# Patient Record
Sex: Female | Born: 1960 | Race: White | Hispanic: No | Smoking: Never smoker
Health system: Southern US, Community
[De-identification: ages and names within clinical notes are randomized; demographics above are authoritative.]

## PROBLEM LIST (undated history)

## (undated) DIAGNOSIS — M3130 Wegener's granulomatosis without renal involvement: Secondary | ICD-10-CM

## (undated) DIAGNOSIS — Z8614 Personal history of Methicillin resistant Staphylococcus aureus infection: Secondary | ICD-10-CM

## (undated) DIAGNOSIS — M1990 Unspecified osteoarthritis, unspecified site: Secondary | ICD-10-CM

## (undated) DIAGNOSIS — K219 Gastro-esophageal reflux disease without esophagitis: Secondary | ICD-10-CM

## (undated) DIAGNOSIS — K8020 Calculus of gallbladder without cholecystitis without obstruction: Secondary | ICD-10-CM

## (undated) DIAGNOSIS — T7840XA Allergy, unspecified, initial encounter: Secondary | ICD-10-CM

## (undated) DIAGNOSIS — F419 Anxiety disorder, unspecified: Secondary | ICD-10-CM

## (undated) DIAGNOSIS — H9319 Tinnitus, unspecified ear: Secondary | ICD-10-CM

## (undated) DIAGNOSIS — I341 Nonrheumatic mitral (valve) prolapse: Secondary | ICD-10-CM

## (undated) DIAGNOSIS — K2970 Gastritis, unspecified, without bleeding: Secondary | ICD-10-CM

## (undated) DIAGNOSIS — E079 Disorder of thyroid, unspecified: Secondary | ICD-10-CM

## (undated) DIAGNOSIS — R011 Cardiac murmur, unspecified: Secondary | ICD-10-CM

## (undated) DIAGNOSIS — G44009 Cluster headache syndrome, unspecified, not intractable: Secondary | ICD-10-CM

## (undated) DIAGNOSIS — M810 Age-related osteoporosis without current pathological fracture: Secondary | ICD-10-CM

## (undated) DIAGNOSIS — I8390 Asymptomatic varicose veins of unspecified lower extremity: Secondary | ICD-10-CM

## (undated) DIAGNOSIS — J3089 Other allergic rhinitis: Secondary | ICD-10-CM

## (undated) DIAGNOSIS — E785 Hyperlipidemia, unspecified: Secondary | ICD-10-CM

## (undated) DIAGNOSIS — K5790 Diverticulosis of intestine, part unspecified, without perforation or abscess without bleeding: Secondary | ICD-10-CM

## (undated) DIAGNOSIS — N2889 Other specified disorders of kidney and ureter: Secondary | ICD-10-CM

## (undated) HISTORY — DX: Unspecified osteoarthritis, unspecified site: M19.90

## (undated) HISTORY — DX: Asymptomatic varicose veins of unspecified lower extremity: I83.90

## (undated) HISTORY — DX: Age-related osteoporosis without current pathological fracture: M81.0

## (undated) HISTORY — PX: OTHER SURGICAL HISTORY: SHX169

## (undated) HISTORY — PX: COLONOSCOPY: SHX174

## (undated) HISTORY — PX: AIRWAY FOREIGN BODY REMOVAL: SHX1129

## (undated) HISTORY — DX: Personal history of Methicillin resistant Staphylococcus aureus infection: Z86.14

## (undated) HISTORY — DX: Cardiac murmur, unspecified: R01.1

## (undated) HISTORY — PX: TEAR DUCT PROBING: SHX793

## (undated) HISTORY — DX: Disorder of thyroid, unspecified: E07.9

## (undated) HISTORY — PX: UPPER GASTROINTESTINAL ENDOSCOPY: SHX188

## (undated) HISTORY — DX: Other allergic rhinitis: J30.89

## (undated) HISTORY — DX: Nonrheumatic mitral (valve) prolapse: I34.1

## (undated) HISTORY — DX: Anxiety disorder, unspecified: F41.9

## (undated) HISTORY — DX: Gastritis, unspecified, without bleeding: K29.70

## (undated) HISTORY — DX: Allergy, unspecified, initial encounter: T78.40XA

## (undated) HISTORY — PX: NASAL SINUS SURGERY: SHX719

## (undated) HISTORY — PX: TRACHEOSTOMY CLOSURE: SHX458

## (undated) HISTORY — DX: Gastro-esophageal reflux disease without esophagitis: K21.9

## (undated) HISTORY — DX: Other specified disorders of kidney and ureter: N28.89

## (undated) HISTORY — DX: Cluster headache syndrome, unspecified, not intractable: G44.009

## (undated) HISTORY — DX: Tinnitus, unspecified ear: H93.19

## (undated) HISTORY — DX: Calculus of gallbladder without cholecystitis without obstruction: K80.20

## (undated) HISTORY — DX: Hyperlipidemia, unspecified: E78.5

## (undated) HISTORY — DX: Diverticulosis of intestine, part unspecified, without perforation or abscess without bleeding: K57.90

---

## 1996-09-04 DIAGNOSIS — M3130 Wegener's granulomatosis without renal involvement: Secondary | ICD-10-CM

## 1996-09-04 HISTORY — DX: Wegener's granulomatosis without renal involvement: M31.30

## 2009-03-02 ENCOUNTER — Ambulatory Visit: Payer: Self-pay | Admitting: Oncology

## 2009-03-04 ENCOUNTER — Encounter: Payer: Self-pay | Admitting: Pulmonary Disease

## 2009-03-05 LAB — CBC WITH DIFFERENTIAL/PLATELET
BASO%: 0.4 % (ref 0.0–2.0)
EOS%: 0.4 % (ref 0.0–7.0)
HCT: 40 % (ref 34.8–46.6)
LYMPH%: 32.2 % (ref 14.0–49.7)
MCH: 31.2 pg (ref 25.1–34.0)
MCHC: 33.8 g/dL (ref 31.5–36.0)
NEUT%: 56.3 % (ref 38.4–76.8)
Platelets: 273 10*3/uL (ref 145–400)

## 2009-03-12 LAB — CBC WITH DIFFERENTIAL/PLATELET
Basophils Absolute: 0 10*3/uL (ref 0.0–0.1)
Eosinophils Absolute: 0 10*3/uL (ref 0.0–0.5)
LYMPH%: 28.5 % (ref 14.0–49.7)
MCH: 30.9 pg (ref 25.1–34.0)
MCHC: 33.3 g/dL (ref 31.5–36.0)
MONO#: 1.1 10*3/uL — ABNORMAL HIGH (ref 0.1–0.9)
MONO%: 10.3 % (ref 0.0–14.0)
NEUT%: 60.5 % (ref 38.4–76.8)
RBC: 4.21 10*6/uL (ref 3.70–5.45)
RDW: 13.8 % (ref 11.2–14.5)

## 2009-03-19 LAB — CBC WITH DIFFERENTIAL/PLATELET
BASO%: 0.3 % (ref 0.0–2.0)
HCT: 39.7 % (ref 34.8–46.6)
LYMPH%: 28.4 % (ref 14.0–49.7)
MCHC: 33.8 g/dL (ref 31.5–36.0)
MCV: 91.7 fL (ref 79.5–101.0)
MONO#: 1.2 10*3/uL — ABNORMAL HIGH (ref 0.1–0.9)
MONO%: 10.3 % (ref 0.0–14.0)
NEUT%: 60.6 % (ref 38.4–76.8)
Platelets: 244 10*3/uL (ref 145–400)
RBC: 4.33 10*6/uL (ref 3.70–5.45)
nRBC: 0 % (ref 0–0)

## 2009-03-24 DIAGNOSIS — M3130 Wegener's granulomatosis without renal involvement: Secondary | ICD-10-CM

## 2009-03-24 HISTORY — DX: Wegener's granulomatosis without renal involvement: M31.30

## 2009-04-01 ENCOUNTER — Ambulatory Visit: Payer: Self-pay | Admitting: Oncology

## 2009-04-02 ENCOUNTER — Encounter: Payer: Self-pay | Admitting: Oncology

## 2009-04-02 ENCOUNTER — Other Ambulatory Visit: Admission: RE | Admit: 2009-04-02 | Discharge: 2009-04-02 | Payer: Self-pay | Admitting: Oncology

## 2009-04-27 ENCOUNTER — Ambulatory Visit: Payer: Self-pay | Admitting: Oncology

## 2009-04-27 LAB — COMPREHENSIVE METABOLIC PANEL
Alkaline Phosphatase: 52 U/L (ref 39–117)
BUN: 12 mg/dL (ref 6–23)
Creatinine, Ser: 0.87 mg/dL (ref 0.40–1.20)
Glucose, Bld: 82 mg/dL (ref 70–99)
Total Bilirubin: 0.6 mg/dL (ref 0.3–1.2)

## 2009-04-27 LAB — CBC WITH DIFFERENTIAL/PLATELET
Basophils Absolute: 0 10*3/uL (ref 0.0–0.1)
Eosinophils Absolute: 0 10*3/uL (ref 0.0–0.5)
HCT: 38.8 % (ref 34.8–46.6)
LYMPH%: 17.3 % (ref 14.0–49.7)
MCV: 91.6 fL (ref 79.5–101.0)
MONO%: 10.1 % (ref 0.0–14.0)
NEUT#: 7.5 10*3/uL — ABNORMAL HIGH (ref 1.5–6.5)
NEUT%: 72.1 % (ref 38.4–76.8)
Platelets: 277 10*3/uL (ref 145–400)
RBC: 4.24 10*6/uL (ref 3.70–5.45)

## 2009-04-27 LAB — IGG, IGA, IGM
IgA: 168 mg/dL (ref 68–378)
IgG (Immunoglobin G), Serum: 726 mg/dL (ref 694–1618)
IgM, Serum: 66 mg/dL (ref 60–263)

## 2009-06-03 ENCOUNTER — Ambulatory Visit: Payer: Self-pay | Admitting: Oncology

## 2009-12-06 ENCOUNTER — Ambulatory Visit: Payer: Self-pay | Admitting: Oncology

## 2009-12-06 ENCOUNTER — Other Ambulatory Visit: Admission: RE | Admit: 2009-12-06 | Discharge: 2009-12-06 | Payer: Self-pay | Admitting: Oncology

## 2009-12-06 LAB — CBC WITH DIFFERENTIAL/PLATELET
Eosinophils Absolute: 0 10*3/uL (ref 0.0–0.5)
HCT: 40.3 % (ref 34.8–46.6)
HGB: 13.4 g/dL (ref 11.6–15.9)
LYMPH%: 13.8 % — ABNORMAL LOW (ref 14.0–49.7)
MONO#: 0.8 10*3/uL (ref 0.1–0.9)
NEUT#: 7 10*3/uL — ABNORMAL HIGH (ref 1.5–6.5)
NEUT%: 76.3 % (ref 38.4–76.8)
Platelets: 282 10*3/uL (ref 145–400)
WBC: 9.2 10*3/uL (ref 3.9–10.3)

## 2009-12-09 LAB — COMPREHENSIVE METABOLIC PANEL
ALT: 15 U/L (ref 0–35)
Albumin: 4.2 g/dL (ref 3.5–5.2)
BUN: 13 mg/dL (ref 6–23)
CO2: 20 mEq/L (ref 19–32)
Calcium: 9.3 mg/dL (ref 8.4–10.5)
Chloride: 106 mEq/L (ref 96–112)
Creatinine, Ser: 0.95 mg/dL (ref 0.40–1.20)
Potassium: 4.2 mEq/L (ref 3.5–5.3)

## 2009-12-09 LAB — C-REACTIVE PROTEIN: CRP: 0.3 mg/dL (ref <0.6)

## 2010-01-26 ENCOUNTER — Ambulatory Visit: Payer: Self-pay | Admitting: Oncology

## 2010-02-10 LAB — CBC WITH DIFFERENTIAL/PLATELET
BASO%: 0 % (ref 0.0–2.0)
Basophils Absolute: 0 10*3/uL (ref 0.0–0.1)
EOS%: 0.1 % (ref 0.0–7.0)
Eosinophils Absolute: 0 10*3/uL (ref 0.0–0.5)
HCT: 39.4 % (ref 34.8–46.6)
HGB: 13.1 g/dL (ref 11.6–15.9)
LYMPH%: 8.9 % — ABNORMAL LOW (ref 14.0–49.7)
MCH: 29.8 pg (ref 25.1–34.0)
MCHC: 33.4 g/dL (ref 31.5–36.0)
MCV: 89.4 fL (ref 79.5–101.0)
MONO#: 0.5 10*3/uL (ref 0.1–0.9)
MONO%: 3.9 % (ref 0.0–14.0)
NEUT#: 11.3 10*3/uL — ABNORMAL HIGH (ref 1.5–6.5)
NEUT%: 87.1 % — ABNORMAL HIGH (ref 38.4–76.8)
Platelets: 320 10*3/uL (ref 145–400)
RBC: 4.41 10*6/uL (ref 3.70–5.45)
RDW: 14 % (ref 11.2–14.5)
WBC: 13 10*3/uL — ABNORMAL HIGH (ref 3.9–10.3)
lymph#: 1.2 10*3/uL (ref 0.9–3.3)

## 2010-02-14 LAB — COMPREHENSIVE METABOLIC PANEL
ALT: 17 U/L (ref 0–35)
AST: 17 U/L (ref 0–37)
Albumin: 4 g/dL (ref 3.5–5.2)
Alkaline Phosphatase: 57 U/L (ref 39–117)
BUN: 15 mg/dL (ref 6–23)
CO2: 20 mEq/L (ref 19–32)
Calcium: 9.3 mg/dL (ref 8.4–10.5)
Chloride: 105 mEq/L (ref 96–112)
Creatinine, Ser: 0.87 mg/dL (ref 0.40–1.20)
Glucose, Bld: 83 mg/dL (ref 70–99)
Potassium: 4.1 mEq/L (ref 3.5–5.3)
Sodium: 140 mEq/L (ref 135–145)
Total Bilirubin: 0.3 mg/dL (ref 0.3–1.2)
Total Protein: 6.4 g/dL (ref 6.0–8.3)

## 2010-05-03 ENCOUNTER — Ambulatory Visit: Payer: Self-pay | Admitting: Oncology

## 2010-06-06 ENCOUNTER — Other Ambulatory Visit: Admission: RE | Admit: 2010-06-06 | Discharge: 2010-06-06 | Payer: Self-pay | Admitting: Oncology

## 2010-06-06 ENCOUNTER — Ambulatory Visit: Payer: Self-pay | Admitting: Oncology

## 2010-06-08 LAB — ANCA SCREEN W REFLEX TITER: ANCA Screen: NEGATIVE

## 2010-06-10 LAB — FLOW CYTOMETRY

## 2010-07-07 ENCOUNTER — Ambulatory Visit: Payer: Self-pay | Admitting: Oncology

## 2010-07-11 ENCOUNTER — Other Ambulatory Visit: Admission: RE | Admit: 2010-07-11 | Discharge: 2010-07-11 | Payer: Self-pay | Admitting: Oncology

## 2010-07-11 LAB — COMPREHENSIVE METABOLIC PANEL
Albumin: 4.3 g/dL (ref 3.5–5.2)
BUN: 12 mg/dL (ref 6–23)
CO2: 27 mEq/L (ref 19–32)
Calcium: 9.4 mg/dL (ref 8.4–10.5)
Chloride: 105 mEq/L (ref 96–112)
Glucose, Bld: 87 mg/dL (ref 70–99)
Potassium: 3.7 mEq/L (ref 3.5–5.3)

## 2010-07-11 LAB — CBC WITH DIFFERENTIAL/PLATELET
Basophils Absolute: 0 10*3/uL (ref 0.0–0.1)
HCT: 40.4 % (ref 34.8–46.6)
HGB: 13.2 g/dL (ref 11.6–15.9)
MCH: 29.5 pg (ref 25.1–34.0)
MONO#: 0.8 10*3/uL (ref 0.1–0.9)
NEUT%: 62.7 % (ref 38.4–76.8)
WBC: 10.4 10*3/uL — ABNORMAL HIGH (ref 3.9–10.3)
lymph#: 2.9 10*3/uL (ref 0.9–3.3)

## 2010-08-08 ENCOUNTER — Other Ambulatory Visit
Admission: RE | Admit: 2010-08-08 | Discharge: 2010-08-08 | Payer: Self-pay | Source: Home / Self Care | Admitting: Oncology

## 2010-08-08 ENCOUNTER — Ambulatory Visit: Payer: Self-pay | Admitting: Oncology

## 2010-08-08 LAB — CBC WITH DIFFERENTIAL/PLATELET
BASO%: 0.7 % (ref 0.0–2.0)
Eosinophils Absolute: 0 10*3/uL (ref 0.0–0.5)
LYMPH%: 12 % — ABNORMAL LOW (ref 14.0–49.7)
MCHC: 32.9 g/dL (ref 31.5–36.0)
MCV: 90.1 fL (ref 79.5–101.0)
MONO%: 4 % (ref 0.0–14.0)
NEUT#: 9.6 10*3/uL — ABNORMAL HIGH (ref 1.5–6.5)
RBC: 4.53 10*6/uL (ref 3.70–5.45)
RDW: 13.6 % (ref 11.2–14.5)
WBC: 11.5 10*3/uL — ABNORMAL HIGH (ref 3.9–10.3)

## 2010-08-08 LAB — COMPREHENSIVE METABOLIC PANEL
ALT: 18 U/L (ref 0–35)
AST: 24 U/L (ref 0–37)
Alkaline Phosphatase: 70 U/L (ref 39–117)
CO2: 23 mEq/L (ref 19–32)
Creatinine, Ser: 0.83 mg/dL (ref 0.40–1.20)
Sodium: 141 mEq/L (ref 135–145)
Total Bilirubin: 0.5 mg/dL (ref 0.3–1.2)
Total Protein: 6.6 g/dL (ref 6.0–8.3)

## 2010-08-11 LAB — FLOW CYTOMETRY

## 2010-08-15 ENCOUNTER — Encounter: Payer: Self-pay | Admitting: Pulmonary Disease

## 2010-08-15 LAB — SEDIMENTATION RATE: Sed Rate: 9 mm/hr (ref 0–22)

## 2010-08-15 LAB — C-REACTIVE PROTEIN: CRP: 0.3 mg/dL (ref <0.6)

## 2010-09-06 ENCOUNTER — Ambulatory Visit: Payer: Self-pay | Admitting: Oncology

## 2010-09-06 ENCOUNTER — Other Ambulatory Visit
Admission: RE | Admit: 2010-09-06 | Discharge: 2010-09-06 | Payer: Self-pay | Source: Home / Self Care | Admitting: Oncology

## 2010-09-06 LAB — COMPREHENSIVE METABOLIC PANEL
ALT: 21 U/L (ref 0–35)
AST: 23 U/L (ref 0–37)
Albumin: 4 g/dL (ref 3.5–5.2)
Alkaline Phosphatase: 65 U/L (ref 39–117)
BUN: 11 mg/dL (ref 6–23)
CO2: 26 mEq/L (ref 19–32)
Calcium: 9.1 mg/dL (ref 8.4–10.5)
Chloride: 106 mEq/L (ref 96–112)
Creatinine, Ser: 0.86 mg/dL (ref 0.40–1.20)
Glucose, Bld: 113 mg/dL — ABNORMAL HIGH (ref 70–99)
Potassium: 3.7 mEq/L (ref 3.5–5.3)
Sodium: 141 mEq/L (ref 135–145)
Total Bilirubin: 0.4 mg/dL (ref 0.3–1.2)
Total Protein: 7.2 g/dL (ref 6.0–8.3)

## 2010-09-06 LAB — CBC WITH DIFFERENTIAL/PLATELET
BASO%: 0.5 % (ref 0.0–2.0)
Basophils Absolute: 0 10*3/uL (ref 0.0–0.1)
EOS%: 0.1 % (ref 0.0–7.0)
Eosinophils Absolute: 0 10*3/uL (ref 0.0–0.5)
HCT: 40.2 % (ref 34.8–46.6)
HGB: 13.7 g/dL (ref 11.6–15.9)
LYMPH%: 11.5 % — ABNORMAL LOW (ref 14.0–49.7)
MCH: 30.5 pg (ref 25.1–34.0)
MCHC: 34.2 g/dL (ref 31.5–36.0)
MCV: 89.2 fL (ref 79.5–101.0)
MONO#: 0.3 10*3/uL (ref 0.1–0.9)
MONO%: 3.2 % (ref 0.0–14.0)
NEUT#: 7.7 10*3/uL — ABNORMAL HIGH (ref 1.5–6.5)
NEUT%: 84.7 % — ABNORMAL HIGH (ref 38.4–76.8)
Platelets: 307 10*3/uL (ref 145–400)
RBC: 4.5 10*6/uL (ref 3.70–5.45)
RDW: 13.9 % (ref 11.2–14.5)
WBC: 9 10*3/uL (ref 3.9–10.3)
lymph#: 1 10*3/uL (ref 0.9–3.3)

## 2010-09-08 LAB — FLOW CYTOMETRY

## 2010-10-04 ENCOUNTER — Other Ambulatory Visit: Payer: Self-pay | Admitting: Oncology

## 2010-10-04 LAB — COMPREHENSIVE METABOLIC PANEL
ALT: 18 U/L (ref 0–35)
AST: 18 U/L (ref 0–37)
Albumin: 3.9 g/dL (ref 3.5–5.2)
Alkaline Phosphatase: 67 U/L (ref 39–117)
BUN: 18 mg/dL (ref 6–23)
CO2: 30 mEq/L (ref 19–32)
Calcium: 9.7 mg/dL (ref 8.4–10.5)
Chloride: 103 mEq/L (ref 96–112)
Creatinine, Ser: 0.87 mg/dL (ref 0.40–1.20)
Glucose, Bld: 81 mg/dL (ref 70–99)
Potassium: 4.2 mEq/L (ref 3.5–5.3)
Sodium: 141 mEq/L (ref 135–145)
Total Bilirubin: 0.6 mg/dL (ref 0.3–1.2)
Total Protein: 6.9 g/dL (ref 6.0–8.3)

## 2010-10-04 LAB — CBC WITH DIFFERENTIAL/PLATELET
BASO%: 0.5 % (ref 0.0–2.0)
Basophils Absolute: 0 10*3/uL (ref 0.0–0.1)
EOS%: 1 % (ref 0.0–7.0)
Eosinophils Absolute: 0.1 10*3/uL (ref 0.0–0.5)
HCT: 40.8 % (ref 34.8–46.6)
HGB: 13.6 g/dL (ref 11.6–15.9)
LYMPH%: 25.5 % (ref 14.0–49.7)
MCH: 29.6 pg (ref 25.1–34.0)
MCHC: 33.4 g/dL (ref 31.5–36.0)
MCV: 88.5 fL (ref 79.5–101.0)
MONO#: 0.8 10*3/uL (ref 0.1–0.9)
MONO%: 10.1 % (ref 0.0–14.0)
NEUT#: 5.3 10*3/uL (ref 1.5–6.5)
NEUT%: 62.9 % (ref 38.4–76.8)
Platelets: 321 10*3/uL (ref 145–400)
RBC: 4.61 10*6/uL (ref 3.70–5.45)
RDW: 13.2 % (ref 11.2–14.5)
WBC: 8.4 10*3/uL (ref 3.9–10.3)
lymph#: 2.1 10*3/uL (ref 0.9–3.3)

## 2010-10-05 ENCOUNTER — Other Ambulatory Visit (HOSPITAL_COMMUNITY)
Admission: RE | Admit: 2010-10-05 | Discharge: 2010-10-05 | Disposition: A | Discharge status: Home / Self Care | Payer: 59 | Source: Ambulatory Visit | Attending: Oncology | Admitting: Oncology

## 2010-10-05 DIAGNOSIS — M3130 Wegener's granulomatosis without renal involvement: Secondary | ICD-10-CM | POA: Insufficient documentation

## 2010-10-05 DIAGNOSIS — M899 Disorder of bone, unspecified: Secondary | ICD-10-CM | POA: Insufficient documentation

## 2010-10-06 LAB — FLOW CYTOMETRY

## 2010-11-01 ENCOUNTER — Other Ambulatory Visit: Payer: Self-pay | Admitting: Oncology

## 2010-11-01 ENCOUNTER — Encounter (HOSPITAL_BASED_OUTPATIENT_CLINIC_OR_DEPARTMENT_OTHER): Payer: 59 | Admitting: Oncology

## 2010-11-01 ENCOUNTER — Other Ambulatory Visit (HOSPITAL_COMMUNITY)
Admission: RE | Admit: 2010-11-01 | Discharge: 2010-11-01 | Disposition: A | Discharge status: Home / Self Care | Payer: 59 | Source: Ambulatory Visit | Attending: Oncology | Admitting: Oncology

## 2010-11-01 DIAGNOSIS — M3130 Wegener's granulomatosis without renal involvement: Secondary | ICD-10-CM | POA: Insufficient documentation

## 2010-11-01 LAB — CBC WITH DIFFERENTIAL/PLATELET
Basophils Absolute: 0 10*3/uL (ref 0.0–0.1)
EOS%: 0.4 % (ref 0.0–7.0)
Eosinophils Absolute: 0 10*3/uL (ref 0.0–0.5)
HGB: 13.8 g/dL (ref 11.6–15.9)
MCH: 29.4 pg (ref 25.1–34.0)
NEUT#: 8.9 10*3/uL — ABNORMAL HIGH (ref 1.5–6.5)
RDW: 13.5 % (ref 11.2–14.5)
lymph#: 1.3 10*3/uL (ref 0.9–3.3)

## 2010-11-01 LAB — COMPREHENSIVE METABOLIC PANEL
AST: 20 U/L (ref 0–37)
Albumin: 3.9 g/dL (ref 3.5–5.2)
BUN: 12 mg/dL (ref 6–23)
Calcium: 9.6 mg/dL (ref 8.4–10.5)
Chloride: 104 mEq/L (ref 96–112)
Potassium: 4.1 mEq/L (ref 3.5–5.3)
Total Protein: 7 g/dL (ref 6.0–8.3)

## 2010-11-15 NOTE — Letter (Signed)
[name]  [id number]   Imported By: Lennie Odor 09/02/2010 14:31:59  _____________________________________________________________________  External Attachment:    Type:   Image     Comment:   External Document

## 2010-11-29 ENCOUNTER — Encounter (HOSPITAL_BASED_OUTPATIENT_CLINIC_OR_DEPARTMENT_OTHER): Payer: 59 | Admitting: Oncology

## 2010-11-29 ENCOUNTER — Other Ambulatory Visit: Payer: Self-pay | Admitting: Oncology

## 2010-11-29 ENCOUNTER — Other Ambulatory Visit (HOSPITAL_COMMUNITY)
Admission: RE | Admit: 2010-11-29 | Discharge: 2010-11-29 | Disposition: A | Discharge status: Home / Self Care | Payer: 59 | Source: Ambulatory Visit | Attending: Oncology | Admitting: Oncology

## 2010-11-29 DIAGNOSIS — M3130 Wegener's granulomatosis without renal involvement: Secondary | ICD-10-CM | POA: Insufficient documentation

## 2010-11-29 LAB — CBC WITH DIFFERENTIAL/PLATELET
BASO%: 0.5 % (ref 0.0–2.0)
EOS%: 0.8 % (ref 0.0–7.0)
Eosinophils Absolute: 0.1 10*3/uL (ref 0.0–0.5)
MCH: 28.7 pg (ref 25.1–34.0)
MCHC: 32.7 g/dL (ref 31.5–36.0)
MCV: 87.8 fL (ref 79.5–101.0)
MONO%: 9 % (ref 0.0–14.0)
NEUT#: 7.4 10*3/uL — ABNORMAL HIGH (ref 1.5–6.5)
RBC: 4.67 10*6/uL (ref 3.70–5.45)
RDW: 13.7 % (ref 11.2–14.5)

## 2010-11-29 LAB — COMPREHENSIVE METABOLIC PANEL
AST: 14 U/L (ref 0–37)
Albumin: 4.3 g/dL (ref 3.5–5.2)
Alkaline Phosphatase: 67 U/L (ref 39–117)
Potassium: 4.5 mEq/L (ref 3.5–5.3)
Sodium: 140 mEq/L (ref 135–145)
Total Bilirubin: 0.5 mg/dL (ref 0.3–1.2)
Total Protein: 6.4 g/dL (ref 6.0–8.3)

## 2010-11-30 LAB — FLOW CYTOMETRY

## 2011-01-24 ENCOUNTER — Other Ambulatory Visit: Payer: Self-pay | Admitting: Oncology

## 2011-01-24 ENCOUNTER — Other Ambulatory Visit (HOSPITAL_COMMUNITY)
Admission: RE | Admit: 2011-01-24 | Discharge: 2011-01-24 | Disposition: A | Discharge status: Home / Self Care | Payer: 59 | Source: Ambulatory Visit | Attending: Oncology | Admitting: Oncology

## 2011-01-24 ENCOUNTER — Encounter: Payer: 59 | Admitting: Oncology

## 2011-01-24 DIAGNOSIS — M3130 Wegener's granulomatosis without renal involvement: Secondary | ICD-10-CM | POA: Insufficient documentation

## 2011-01-24 LAB — CBC WITH DIFFERENTIAL/PLATELET
BASO%: 0.8 % (ref 0.0–2.0)
Eosinophils Absolute: 0.1 10*3/uL (ref 0.0–0.5)
MCHC: 34.2 g/dL (ref 31.5–36.0)
MONO#: 0.8 10*3/uL (ref 0.1–0.9)
NEUT#: 8.6 10*3/uL — ABNORMAL HIGH (ref 1.5–6.5)
RBC: 4.45 10*6/uL (ref 3.70–5.45)
RDW: 13.3 % (ref 11.2–14.5)
WBC: 12.6 10*3/uL — ABNORMAL HIGH (ref 3.9–10.3)

## 2011-01-25 LAB — COMPREHENSIVE METABOLIC PANEL
ALT: 15 U/L (ref 0–35)
CO2: 24 mEq/L (ref 19–32)
Calcium: 9.1 mg/dL (ref 8.4–10.5)
Chloride: 104 mEq/L (ref 96–112)
Creatinine, Ser: 0.97 mg/dL (ref 0.40–1.20)
Glucose, Bld: 75 mg/dL (ref 70–99)
Sodium: 139 mEq/L (ref 135–145)
Total Bilirubin: 0.3 mg/dL (ref 0.3–1.2)
Total Protein: 6.2 g/dL (ref 6.0–8.3)

## 2011-01-25 LAB — ANCA SCREEN W REFLEX TITER
c-ANCA Screen: NEGATIVE
p-ANCA Screen: NEGATIVE

## 2011-03-02 ENCOUNTER — Other Ambulatory Visit: Payer: Self-pay | Admitting: Oncology

## 2011-03-02 ENCOUNTER — Other Ambulatory Visit (HOSPITAL_COMMUNITY)
Admission: RE | Admit: 2011-03-02 | Discharge: 2011-03-02 | Disposition: A | Discharge status: Home / Self Care | Payer: 59 | Source: Ambulatory Visit | Attending: Oncology | Admitting: Oncology

## 2011-03-02 ENCOUNTER — Encounter (HOSPITAL_BASED_OUTPATIENT_CLINIC_OR_DEPARTMENT_OTHER): Payer: 59 | Admitting: Oncology

## 2011-03-02 DIAGNOSIS — M3130 Wegener's granulomatosis without renal involvement: Secondary | ICD-10-CM

## 2011-03-02 LAB — CBC WITH DIFFERENTIAL/PLATELET
BASO%: 0.6 % (ref 0.0–2.0)
LYMPH%: 25.4 % (ref 14.0–49.7)
MCHC: 33.2 g/dL (ref 31.5–36.0)
MONO#: 0.6 10*3/uL (ref 0.1–0.9)
Platelets: 319 10*3/uL (ref 145–400)
RBC: 4.58 10*6/uL (ref 3.70–5.45)
WBC: 6.9 10*3/uL (ref 3.9–10.3)
lymph#: 1.7 10*3/uL (ref 0.9–3.3)

## 2011-03-02 LAB — MORPHOLOGY: PLT EST: ADEQUATE

## 2011-03-02 LAB — CHCC SMEAR

## 2011-03-06 LAB — COMPREHENSIVE METABOLIC PANEL
ALT: 17 U/L (ref 0–35)
CO2: 23 mEq/L (ref 19–32)
Calcium: 9.1 mg/dL (ref 8.4–10.5)
Chloride: 107 mEq/L (ref 96–112)
Creatinine, Ser: 0.86 mg/dL (ref 0.50–1.10)
Glucose, Bld: 64 mg/dL — ABNORMAL LOW (ref 70–99)
Sodium: 141 mEq/L (ref 135–145)
Total Protein: 6.2 g/dL (ref 6.0–8.3)

## 2011-03-06 LAB — ANCA SCREEN W REFLEX TITER
c-ANCA Screen: NEGATIVE
p-ANCA Screen: NEGATIVE

## 2011-04-04 ENCOUNTER — Encounter: Payer: 59 | Admitting: Oncology

## 2011-04-11 ENCOUNTER — Encounter (HOSPITAL_BASED_OUTPATIENT_CLINIC_OR_DEPARTMENT_OTHER): Payer: 59 | Admitting: Oncology

## 2011-04-11 ENCOUNTER — Other Ambulatory Visit: Payer: Self-pay | Admitting: Oncology

## 2011-04-11 DIAGNOSIS — M3130 Wegener's granulomatosis without renal involvement: Secondary | ICD-10-CM

## 2011-04-11 LAB — CBC WITH DIFFERENTIAL/PLATELET
BASO%: 0.5 % (ref 0.0–2.0)
EOS%: 0.9 % (ref 0.0–7.0)
LYMPH%: 18.3 % (ref 14.0–49.7)
MCH: 30.8 pg (ref 25.1–34.0)
MCHC: 33.7 g/dL (ref 31.5–36.0)
MCV: 91.3 fL (ref 79.5–101.0)
MONO%: 7.4 % (ref 0.0–14.0)
Platelets: 318 10*3/uL (ref 145–400)
RBC: 4.49 10*6/uL (ref 3.70–5.45)

## 2011-04-12 LAB — IGG, IGA, IGM: IgG (Immunoglobin G), Serum: 770 mg/dL (ref 690–1700)

## 2011-04-12 LAB — COMPREHENSIVE METABOLIC PANEL
ALT: 12 U/L (ref 0–35)
AST: 14 U/L (ref 0–37)
Alkaline Phosphatase: 52 U/L (ref 39–117)
BUN: 15 mg/dL (ref 6–23)
Calcium: 9.4 mg/dL (ref 8.4–10.5)
Creatinine, Ser: 0.88 mg/dL (ref 0.50–1.10)
Total Bilirubin: 0.3 mg/dL (ref 0.3–1.2)

## 2011-04-12 LAB — ANCA SCREEN W REFLEX TITER: c-ANCA Screen: NEGATIVE

## 2011-06-06 ENCOUNTER — Other Ambulatory Visit: Payer: Self-pay | Admitting: Oncology

## 2011-06-06 ENCOUNTER — Encounter (HOSPITAL_BASED_OUTPATIENT_CLINIC_OR_DEPARTMENT_OTHER): Payer: 59 | Admitting: Oncology

## 2011-06-06 ENCOUNTER — Other Ambulatory Visit (HOSPITAL_COMMUNITY)
Admission: RE | Admit: 2011-06-06 | Discharge: 2011-06-06 | Disposition: A | Discharge status: Home / Self Care | Payer: 59 | Source: Ambulatory Visit | Attending: Oncology | Admitting: Oncology

## 2011-06-06 DIAGNOSIS — M3130 Wegener's granulomatosis without renal involvement: Secondary | ICD-10-CM

## 2011-06-06 LAB — COMPREHENSIVE METABOLIC PANEL
Albumin: 3.5 g/dL (ref 3.5–5.2)
Alkaline Phosphatase: 58 U/L (ref 39–117)
BUN: 16 mg/dL (ref 6–23)
CO2: 29 mEq/L (ref 19–32)
Glucose, Bld: 70 mg/dL (ref 70–99)
Potassium: 3.9 mEq/L (ref 3.5–5.3)
Total Bilirubin: 0.2 mg/dL — ABNORMAL LOW (ref 0.3–1.2)

## 2011-06-06 LAB — CBC WITH DIFFERENTIAL/PLATELET
Eosinophils Absolute: 0.1 10*3/uL (ref 0.0–0.5)
HCT: 41.2 % (ref 34.8–46.6)
LYMPH%: 14.1 % (ref 14.0–49.7)
MCHC: 34 g/dL (ref 31.5–36.0)
MCV: 90.2 fL (ref 79.5–101.0)
MONO#: 0.9 10*3/uL (ref 0.1–0.9)
MONO%: 7.2 % (ref 0.0–14.0)
NEUT#: 9.2 10*3/uL — ABNORMAL HIGH (ref 1.5–6.5)
NEUT%: 77.5 % — ABNORMAL HIGH (ref 38.4–76.8)
Platelets: 310 10*3/uL (ref 145–400)
RBC: 4.57 10*6/uL (ref 3.70–5.45)
WBC: 11.9 10*3/uL — ABNORMAL HIGH (ref 3.9–10.3)

## 2011-06-06 LAB — MORPHOLOGY

## 2011-06-06 LAB — LACTATE DEHYDROGENASE: LDH: 179 U/L (ref 94–250)

## 2011-06-07 ENCOUNTER — Encounter (HOSPITAL_BASED_OUTPATIENT_CLINIC_OR_DEPARTMENT_OTHER): Payer: 59 | Admitting: Oncology

## 2011-06-07 ENCOUNTER — Ambulatory Visit (HOSPITAL_COMMUNITY)
Admission: RE | Admit: 2011-06-07 | Discharge: 2011-06-07 | Disposition: A | Discharge status: Home / Self Care | Payer: 59 | Source: Ambulatory Visit | Attending: Oncology | Admitting: Oncology

## 2011-06-07 ENCOUNTER — Other Ambulatory Visit: Payer: Self-pay | Admitting: Oncology

## 2011-06-07 DIAGNOSIS — C801 Malignant (primary) neoplasm, unspecified: Secondary | ICD-10-CM

## 2011-06-07 DIAGNOSIS — J069 Acute upper respiratory infection, unspecified: Secondary | ICD-10-CM

## 2011-06-07 DIAGNOSIS — M3130 Wegener's granulomatosis without renal involvement: Secondary | ICD-10-CM

## 2011-06-07 DIAGNOSIS — R059 Cough, unspecified: Secondary | ICD-10-CM | POA: Insufficient documentation

## 2011-06-07 DIAGNOSIS — J019 Acute sinusitis, unspecified: Secondary | ICD-10-CM

## 2011-06-07 DIAGNOSIS — R05 Cough: Secondary | ICD-10-CM | POA: Insufficient documentation

## 2011-06-07 LAB — SEDIMENTATION RATE: Sed Rate: 8 mm/hr (ref 0–22)

## 2011-06-07 LAB — IGG: IgG (Immunoglobin G), Serum: 727 mg/dL (ref 690–1700)

## 2011-06-07 LAB — FLOW CYTOMETRY

## 2011-07-20 ENCOUNTER — Ambulatory Visit (HOSPITAL_BASED_OUTPATIENT_CLINIC_OR_DEPARTMENT_OTHER): Payer: 59

## 2011-09-02 ENCOUNTER — Telehealth: Payer: Self-pay | Admitting: Oncology

## 2011-09-02 NOTE — Telephone Encounter (Signed)
S/w pt today re appts for jan and aug 2013

## 2011-09-13 ENCOUNTER — Other Ambulatory Visit: Payer: Self-pay | Admitting: Oncology

## 2011-09-13 ENCOUNTER — Other Ambulatory Visit: Payer: 59 | Admitting: Lab

## 2011-09-13 DIAGNOSIS — M3130 Wegener's granulomatosis without renal involvement: Secondary | ICD-10-CM

## 2011-09-13 LAB — CBC WITH DIFFERENTIAL/PLATELET
Basophils Absolute: 0.1 10*3/uL (ref 0.0–0.1)
Eosinophils Absolute: 0.1 10*3/uL (ref 0.0–0.5)
HCT: 41.6 % (ref 34.8–46.6)
HGB: 13.9 g/dL (ref 11.6–15.9)
MCV: 90.2 fL (ref 79.5–101.0)
NEUT#: 4.7 10*3/uL (ref 1.5–6.5)
NEUT%: 60.6 % (ref 38.4–76.8)
RDW: 13.1 % (ref 11.2–14.5)
lymph#: 2.3 10*3/uL (ref 0.9–3.3)

## 2011-09-13 LAB — MORPHOLOGY: RBC Comments: NORMAL

## 2011-09-14 LAB — COMPREHENSIVE METABOLIC PANEL
ALT: 17 U/L (ref 0–35)
AST: 15 U/L (ref 0–37)
Albumin: 4.1 g/dL (ref 3.5–5.2)
Alkaline Phosphatase: 58 U/L (ref 39–117)
Calcium: 9.5 mg/dL (ref 8.4–10.5)
Chloride: 105 mEq/L (ref 96–112)
Potassium: 4 mEq/L (ref 3.5–5.3)
Sodium: 142 mEq/L (ref 135–145)
Total Protein: 6.3 g/dL (ref 6.0–8.3)

## 2011-09-18 ENCOUNTER — Other Ambulatory Visit: Payer: Self-pay | Admitting: *Deleted

## 2011-09-18 DIAGNOSIS — A5002 Early congenital syphilitic osteochondropathy: Secondary | ICD-10-CM

## 2011-09-18 DIAGNOSIS — M9080 Osteopathy in diseases classified elsewhere, unspecified site: Secondary | ICD-10-CM

## 2011-09-19 ENCOUNTER — Encounter: Payer: Self-pay | Admitting: *Deleted

## 2011-09-20 ENCOUNTER — Other Ambulatory Visit: Payer: Self-pay | Admitting: Oncology

## 2011-09-20 ENCOUNTER — Ambulatory Visit (HOSPITAL_BASED_OUTPATIENT_CLINIC_OR_DEPARTMENT_OTHER): Payer: 59 | Admitting: Oncology

## 2011-09-20 ENCOUNTER — Other Ambulatory Visit (HOSPITAL_BASED_OUTPATIENT_CLINIC_OR_DEPARTMENT_OTHER): Payer: 59 | Admitting: Lab

## 2011-09-20 ENCOUNTER — Other Ambulatory Visit (HOSPITAL_COMMUNITY)
Admission: RE | Admit: 2011-09-20 | Discharge: 2011-09-20 | Disposition: A | Discharge status: Home / Self Care | Payer: 59 | Source: Ambulatory Visit | Attending: Oncology | Admitting: Oncology

## 2011-09-20 VITALS — BP 109/79 | HR 65 | Temp 97.9°F | Wt 164.6 lb

## 2011-09-20 DIAGNOSIS — M3130 Wegener's granulomatosis without renal involvement: Secondary | ICD-10-CM

## 2011-09-20 DIAGNOSIS — A5002 Early congenital syphilitic osteochondropathy: Secondary | ICD-10-CM

## 2011-09-20 NOTE — Progress Notes (Signed)
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 Ozella Rocks in 1999.  She has been treated in the past with Cytoxan and prednisone, methotrexate very briefly, Cytoxan orally, then Imuran for some time, and then off treatment for some years.  She was started on prednisone alone in 2009, and then in 06/2008 the azathioprine was added again at 150 mg daily, and variable doses of prednisone.  It is felt that she failed remission maintenance therapy since she could not reduce her dose of prednisone below 20 mg daily without having a resurgence of symptoms, and therefore she has been started on Rituxan with the first dose given at the [facility] on 06/24.  The patient tolerated the treatment quite well.  She was referred here to facilitate further weekly Rituxan treatments with 3 additional treatments planned at this point.  PMHx: Associated with the Wegener, the past medical history is significant for prior tracheostomy, prior sinus surgery x 2, prior right tear duct surgery at [facility] for epiphora (with subsequent re-obstruction).  She also has a history of nasal saddling and inflammation left greater than right.  She also gets occasional thrush problems because of the steroid treatment.  Aside from the Wegener, she has a history of osteopenia, history of migraines, history of GERD, history of mitral valve prolapse, and a history of MRSA infection remotely (1999).  Interval History:   She is very fatigued, partly from his lack of sleep, and partly from a lot of stress. She is still on her husbands insurance and can't quite divorce him until she has her own independent set up. That may take in a year perhaps. Meanwhile her son [name] is applying to college, with its own set of stresses, and her daughter just turned 64. She had to put off her eyes surgery because her dad, was visiting here, had a  diverticular bleed, so surgery is now planned for January 25. She's making it to the gym about twice a week, using the elliptical perhaps 45 minutes at a time.  ROS:  Aside from the fatigue and lack of sleep she has pain in various joints. These are intermittent I, not more frequent or intense than before. She is having some sinus problems and is hoarse. She started herself on Diflucan today since her hoarseness usually is related to thrush from her steroids. (This is usually not clinically apparent but the Diflucan usually takes care of the problem). She's had no fevers, no rash, no bleeding, no significant cough, phlegm production, pleurisy, hemoptysis, or worsening shortness of breath. A detailed review of systems was otherwise negative  Allergies not on file  Current Outpatient Prescriptions  Medication Sig Dispense Refill  . Calcium Carbonate-Vitamin D (CALCIUM 600+D) 600-400 MG-UNIT per tablet Take 1 tablet by mouth daily.      . cholecalciferol (VITAMIN D) 400 UNITS TABS Take 50,000 Units by mouth once a week.       . esomeprazole (NEXIUM) 40 MG capsule Take 40 mg by mouth daily before breakfast.      . fluconazole (DIFLUCAN) 100 MG tablet Take 100 mg by mouth daily.      . fluticasone (FLONASE) 50 MCG/ACT nasal spray Place 2 sprays into the nose daily.      Marland Kitchen LORazepam (ATIVAN) 1 MG tablet Take 1 mg by mouth every 8 (eight) hours.      Marland Kitchen  mometasone (ASMANEX) 220 MCG/INH inhaler Inhale 2 puffs into the lungs daily.      . predniSONE (DELTASONE) 5 MG tablet Take 5 mg by mouth daily.      . ranitidine (ZANTAC) 150 MG capsule Take 150 mg by mouth 2 (two) times daily.      Marland Kitchen sulfamethoxazole-trimethoprim (BACTRIM DS) 800-160 MG per tablet Take 1 tablet by mouth 2 (two) times daily.      Marland Kitchen zolpidem (AMBIEN) 10 MG tablet Take 10 mg by mouth at bedtime as needed.        FAMILY HISTORY:  The patient's father is alive in his early 56s.  The patient's mother is alive in her late 63s.  The patient  has 3 sisters.  There is no one else in the family with autoimmune disease or with cancer.  GYNECOLOGIC HISTORY:  She is GX, P2, first pregnancy to term age 89.  Last menstrual period was June of last year, and she had none for many months before that.  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  Her former husband, Gregary Signs, works for the [employer] for [employer], where he is the [employer].  They have a 38 year old son and an 96 year old daughter.  They attend [employer] here in [place].  HEALTH MAINTENANCE:  She has never had a colonoscopy.  She is not a smoker, and never abused tobacco or alcohol.  She tells me her cholesterol is good.  She has a history of osteopenia, and tells me she is going to be switched from Actonel to Reclast by her primary care physician in [place].  She is overdue for mammography and tells me that she will call to make an appointment.  Objective:  Filed Vitals:   09/20/11 1235  BP: 109/79  Pulse: 65  Temp: 97.9 F (36.6 C)    BMI: There is no height on file to calculate BMI.   ECOG FS: 1  Physical Exam:   Sclerae unicteric  Oropharynx clear, no thrush evident  No peripheral adenopathy  Lungs clear -- no rales or rhonchi  Heart regular rate and rhythm  Abdomen benign  MSK no focal spinal tenderness, no peripheral edema  Neuro nonfocal  Breast exam: Deferred  Lab Results:      Chemistry      Component Value Date/Time   NA 142 09/13/2011 1405   NA 142 09/13/2011 1405   NA 142 09/13/2011 1405   K 4.0 09/13/2011 1405   K 4.0 09/13/2011 1405   K 4.0 09/13/2011 1405   CL 105 09/13/2011 1405   CL 105 09/13/2011 1405   CL 105 09/13/2011 1405   CO2 25 09/13/2011 1405   CO2 25 09/13/2011 1405   CO2 25 09/13/2011 1405   BUN 17 09/13/2011 1405   BUN 17 09/13/2011 1405   BUN 17 09/13/2011 1405   CREATININE 0.89 09/13/2011 1405   CREATININE 0.89 09/13/2011 1405    CREATININE 0.89 09/13/2011 1405      Component Value Date/Time   CALCIUM 9.5 09/13/2011 1405   CALCIUM 9.5 09/13/2011 1405   CALCIUM 9.5 09/13/2011 1405   ALKPHOS 58 09/13/2011 1405   ALKPHOS 58 09/13/2011 1405   ALKPHOS 58 09/13/2011 1405   AST 15 09/13/2011 1405   AST 15 09/13/2011 1405   AST 15 09/13/2011 1405   ALT 17 09/13/2011 1405   ALT 17 09/13/2011  1405   ALT 17 09/13/2011 1405   BILITOT 0.4 09/13/2011 1405   BILITOT 0.4 09/13/2011 1405   BILITOT 0.4 09/13/2011 1405       Lab Results  Component Value Date   WBC 7.7 09/13/2011   HGB 13.9 09/13/2011   HCT 41.6 09/13/2011   MCV 90.2 09/13/2011   PLT 307 09/13/2011   NEUTROABS 4.7 09/13/2011    Studies/Results:  No new results found.  Assessment: A 51 year old [place] woman with a history of Wegener granulomatosis initially diagnosed in 1999, variously treated as noted above in the history of present illness,s/p Rituxan x4 completed July 2010, currently on low-dose prednisone maintenance.   Plan: We are sending flow cytometry today to quantitate her seat 19 positive B cells she prepares for her surgery. She was supposed to have another flow in February but I don't think that will be necessary. The next one will be may. Generally Dr. Ozella Rocks sends her the container and that prompts the testing. She will see him again in May. Accordingly she will see [place] again in November. She knows to call for any problems that may develop before that visit.  MAGRINAT,GUSTAV C 09/20/2011

## 2011-09-24 ENCOUNTER — Other Ambulatory Visit: Payer: Self-pay | Admitting: Oncology

## 2011-09-26 ENCOUNTER — Encounter: Payer: Self-pay | Admitting: *Deleted

## 2011-09-26 NOTE — Progress Notes (Signed)
Fax most recent flow cytometry to Dr Clare Charon at [facility] to [phone].

## 2011-10-25 ENCOUNTER — Ambulatory Visit
Admission: RE | Admit: 2011-10-25 | Discharge: 2011-10-25 | Disposition: A | Discharge status: Home / Self Care | Payer: 59 | Source: Ambulatory Visit | Attending: Internal Medicine | Admitting: Internal Medicine

## 2011-10-25 ENCOUNTER — Other Ambulatory Visit: Payer: Self-pay | Admitting: Internal Medicine

## 2011-10-25 DIAGNOSIS — R609 Edema, unspecified: Secondary | ICD-10-CM

## 2011-10-25 DIAGNOSIS — R52 Pain, unspecified: Secondary | ICD-10-CM

## 2011-12-13 ENCOUNTER — Other Ambulatory Visit: Payer: Self-pay | Admitting: *Deleted

## 2011-12-13 NOTE — Progress Notes (Signed)
Message left by pt stating need for lab draw per [facility] clinic kit which she has had drawn at our facility before.  Appointment request generated per EPIC.

## 2011-12-14 ENCOUNTER — Telehealth: Payer: Self-pay | Admitting: *Deleted

## 2011-12-14 NOTE — Telephone Encounter (Signed)
per the nurse the patient will be bring her own lab kit

## 2011-12-15 ENCOUNTER — Other Ambulatory Visit: Payer: 59 | Admitting: Lab

## 2011-12-19 ENCOUNTER — Telehealth: Payer: Self-pay | Admitting: *Deleted

## 2011-12-19 NOTE — Telephone Encounter (Signed)
per patient's voicemail message added patient to the lab the appointment for 12-21-2011 at 11:15am left voice message to inform the patient of the date and time

## 2011-12-21 ENCOUNTER — Other Ambulatory Visit (HOSPITAL_BASED_OUTPATIENT_CLINIC_OR_DEPARTMENT_OTHER): Payer: 59 | Admitting: Lab

## 2011-12-21 DIAGNOSIS — M3130 Wegener's granulomatosis without renal involvement: Secondary | ICD-10-CM

## 2011-12-21 LAB — CBC WITH DIFFERENTIAL/PLATELET
Basophils Absolute: 0.1 10*3/uL (ref 0.0–0.1)
Eosinophils Absolute: 0.1 10*3/uL (ref 0.0–0.5)
HCT: 40.6 % (ref 34.8–46.6)
HGB: 13.1 g/dL (ref 11.6–15.9)
MCV: 91.1 fL (ref 79.5–101.0)
MONO%: 9 % (ref 0.0–14.0)
NEUT#: 6 10*3/uL (ref 1.5–6.5)
NEUT%: 66.3 % (ref 38.4–76.8)
Platelets: 301 10*3/uL (ref 145–400)
RDW: 13 % (ref 11.2–14.5)

## 2011-12-29 LAB — COMPREHENSIVE METABOLIC PANEL
Albumin: 4 g/dL (ref 3.5–5.2)
BUN: 15 mg/dL (ref 6–23)
CO2: 26 mEq/L (ref 19–32)
Glucose, Bld: 101 mg/dL — ABNORMAL HIGH (ref 70–99)
Sodium: 141 mEq/L (ref 135–145)
Total Bilirubin: 0.4 mg/dL (ref 0.3–1.2)
Total Protein: 6.3 g/dL (ref 6.0–8.3)

## 2011-12-29 LAB — ANCA SCREEN W REFLEX TITER: c-ANCA Screen: NEGATIVE

## 2012-01-06 ENCOUNTER — Encounter (HOSPITAL_BASED_OUTPATIENT_CLINIC_OR_DEPARTMENT_OTHER): Payer: Self-pay

## 2012-01-06 ENCOUNTER — Emergency Department (HOSPITAL_BASED_OUTPATIENT_CLINIC_OR_DEPARTMENT_OTHER)
Admission: EM | Admit: 2012-01-06 | Discharge: 2012-01-06 | Disposition: A | Discharge status: Home / Self Care | Payer: 59 | Attending: Emergency Medicine | Admitting: Emergency Medicine

## 2012-01-06 DIAGNOSIS — S61409A Unspecified open wound of unspecified hand, initial encounter: Secondary | ICD-10-CM | POA: Insufficient documentation

## 2012-01-06 DIAGNOSIS — W268XXA Contact with other sharp object(s), not elsewhere classified, initial encounter: Secondary | ICD-10-CM | POA: Insufficient documentation

## 2012-01-06 DIAGNOSIS — Y9289 Other specified places as the place of occurrence of the external cause: Secondary | ICD-10-CM | POA: Insufficient documentation

## 2012-01-06 DIAGNOSIS — S61419A Laceration without foreign body of unspecified hand, initial encounter: Secondary | ICD-10-CM

## 2012-01-06 HISTORY — DX: Wegener's granulomatosis without renal involvement: M31.30

## 2012-01-06 MED ORDER — LIDOCAINE-EPINEPHRINE 2 %-1:100000 IJ SOLN
INTRAMUSCULAR | Status: AC
  Administered 2012-01-06: 20 mL via INTRADERMAL
  Filled 2012-01-06: qty 1

## 2012-01-06 MED ORDER — LIDOCAINE-EPINEPHRINE-TETRACAINE (LET) SOLUTION
3.0000 mL | Freq: Once | NASAL | Status: AC
  Administered 2012-01-06: 3 mL via TOPICAL

## 2012-01-06 MED ORDER — LIDOCAINE-EPINEPHRINE-TETRACAINE (LET) SOLUTION
NASAL | Status: AC
  Administered 2012-01-06: 3 mL via TOPICAL
  Filled 2012-01-06: qty 3

## 2012-01-06 MED ORDER — FENTANYL CITRATE 0.05 MG/ML IJ SOLN
INTRAMUSCULAR | Status: AC
  Administered 2012-01-06: 50 ug via INTRAVENOUS
  Filled 2012-01-06: qty 2

## 2012-01-06 MED ORDER — MORPHINE SULFATE 4 MG/ML IJ SOLN
INTRAMUSCULAR | Status: AC
  Administered 2012-01-06: 4 mg via INTRAVENOUS
  Filled 2012-01-06: qty 1

## 2012-01-06 MED ORDER — OXYCODONE-ACETAMINOPHEN 5-325 MG PO TABS: ORAL_TABLET | ORAL | Status: AC

## 2012-01-06 MED ORDER — LIDOCAINE-EPINEPHRINE 2 %-1:100000 IJ SOLN
20.0000 mL | Freq: Once | INTRAMUSCULAR | Status: AC
  Administered 2012-01-06: 20 mL via INTRADERMAL

## 2012-01-06 MED ORDER — MORPHINE SULFATE 4 MG/ML IJ SOLN
4.0000 mg | Freq: Once | INTRAMUSCULAR | Status: AC
  Administered 2012-01-06: 4 mg via INTRAVENOUS

## 2012-01-06 MED ORDER — HYDROCODONE-ACETAMINOPHEN 5-325 MG PO TABS: ORAL_TABLET | ORAL | Status: DC

## 2012-01-06 MED ORDER — FENTANYL CITRATE 0.05 MG/ML IJ SOLN
50.0000 ug | Freq: Once | INTRAMUSCULAR | Status: AC
  Administered 2012-01-06: 50 ug via INTRAVENOUS

## 2012-01-06 NOTE — ED Notes (Signed)
Per [employer], pt was at [employer] opening a package of floor mats with a razorblade and lacerated her L hand with the razorblade, likely an arterial bleed per medic as it was bleeding profusely upon his arrival, he states laceration is approx 2-3 inches.

## 2012-01-06 NOTE — ED Notes (Signed)
Suture cart at the bedside and ready for the doctor. The hand was unwrapped for the doctor to see and it started to bleeding again. Dr. Oletta Lamas used Wound Seal pour pack on her hand and got the bleeding to stop. The patient is resting and her daughter is in the room with her. The call light is within reach of the patient.

## 2012-01-06 NOTE — ED Provider Notes (Signed)
History   This chart was scribed for Theresa [name]. Theresa Tucholski, MD scribed by Theresa [name]. The patient was seen in room MH05/MH05 seen at 15:42.    CSN: [id number]  Arrival date & time 01/06/12  1525   None     Chief Complaint  Patient presents with  . Laceration    (Consider location/radiation/quality/duration/timing/severity/associated sxs/prior treatment) HPI Level 5 Caveat- Emergent Need Theresa [name] is a 51 y.o. female who presents to the Emergency Department complaining of a 1.5 cm laceration on her left hand with significant active bleeding.  Pt says she was opening floor mats this afternoon in the [employer] parking lot when she cut her hand with the razor blade. Pt has a hx of Wegener's Granulomatosis and has had chemo before to eradicate her b-cells. Pt is right hand dominant.  Past Medical History  Diagnosis Date  . Wegener's granulomatosis     Past Surgical History  Procedure Date  . Tracheostomy closure   . Nasal sinus surgery   . Eye surgery     History reviewed. No pertinent family history.  History  Substance Use Topics  . Smoking status: Never Smoker   . Smokeless tobacco: Never Used  . Alcohol Use: No   Review of Systems 10 Systems reviewed and are negative for acute change except as noted in the HPI. Allergies  Codeine; Vancomycin; and Tape  Home Medications   Current Outpatient Rx  Name Route Sig Dispense Refill  . ALBUTEROL SULFATE HFA 108 (90 BASE) MCG/ACT IN AERS Inhalation Inhale 2 puffs into the lungs every 6 (six) hours as needed. For shortness of breath or wheezing    . CALCIUM CARBONATE-VITAMIN D 600-400 MG-UNIT PO TABS Oral Take 1 tablet by mouth daily.    Marland Kitchen ESOMEPRAZOLE MAGNESIUM 40 MG PO CPDR Oral Take 40 mg by mouth daily before breakfast.    . FLUTICASONE PROPIONATE 50 MCG/ACT NA SUSP Nasal Place 2 sprays into the nose daily.    Marland Kitchen FLUTICASONE PROPIONATE  HFA 110 MCG/ACT IN AERO Inhalation Inhale 2 puffs into the lungs daily.    Marland Kitchen LORAZEPAM 1 MG  PO TABS Oral Take 1 mg by mouth at bedtime as needed. For sleep    . FISH OIL PO Oral Take 1 capsule by mouth 3 (three) times daily.    Marland Kitchen PREDNISONE 5 MG PO TABS Oral Take 5 mg by mouth daily.    Marland Kitchen RANITIDINE HCL 150 MG PO CAPS Oral Take 150 mg by mouth 2 (two) times daily as needed. For acid reflux    . SULFAMETHOXAZOLE-TRIMETHOPRIM 400-80 MG PO TABS Oral Take 1 tablet by mouth every morning.    Marland Kitchen VITAMIN D (ERGOCALCIFEROL) 50000 UNITS PO CAPS Oral Take 50,000 Units by mouth every 7 (seven) days. On Sundays    . HYDROCODONE-ACETAMINOPHEN 5-325 MG PO TABS  1-2 tablets po q 6 hours prn moderate to severe pain 15 tablet 0    BP 141/84  Pulse 64  Temp(Src) 98.2 F (36.8 C) (Oral)  Resp 18  Ht 5\' 7"  (1.702 m)  Wt 145 lb (65.772 kg)  BMI 22.71 kg/m2  SpO2 98%  Physical Exam  Nursing note and vitals reviewed. Constitutional: She is oriented to person, place, and time. She appears well-developed and well-nourished. No distress.  HENT:  Head: Normocephalic and atraumatic.  Eyes: EOM are normal. Pupils are equal, round, and reactive to light.  Neck: Neck supple. No tracheal deviation present.  Cardiovascular: Normal rate.   Pulmonary/Chest: Effort normal. No respiratory  distress.  Abdominal: Soft. She exhibits no distension.  Musculoskeletal: Normal range of motion. She exhibits no edema.       3.0 cm laceration in the (webbing) dorsum of the left hand. Significant bleeding Gross sensation to thumb and finger. Movement of finger is intact. Cap refill is < 2 seconds.   Neurological: She is alert and oriented to person, place, and time. No sensory deficit.       Good distal strength to thumb abduction, opposition, good second finger flexion and extension, all to resistance.  Good sensation to dorsum of first finger along distribution of radial nerve  Skin: Skin is warm and dry.  Psychiatric: She has a normal mood and affect. Her behavior is normal.    ED Course  LACERATION  REPAIR Date/Time: 01/06/2012 6:20 PM Performed by: Lear Ng. Authorized by: Lear Ng Consent: Verbal consent obtained. Risks and benefits: risks, benefits and alternatives were discussed Consent given by: patient Patient understanding: patient states understanding of the procedure being performed Patient identity confirmed: verbally with patient and arm band Time out: Immediately prior to procedure a "time out" was called to verify the correct patient, procedure, equipment, support staff and site/side marked as required. Body area: upper extremity Location details: left hand Laceration length: 3 cm Contamination: The wound is contaminated. Foreign bodies: no foreign bodies Tendon involvement: none Nerve involvement: none Vascular damage: no Anesthesia: local infiltration Local anesthetic: LET (lido,epi,tetracaine) and lidocaine 2% with epinephrine Anesthetic total: 2.5 ml Patient sedated: no Preparation: Patient was prepped and draped in the usual sterile fashion. Irrigation solution: saline Irrigation method: jet lavage Skin closure: 3-0 Prolene Number of sutures: 6 Technique: simple Approximation: close Approximation difficulty: complex Dressing: non-adhesive packing strip Patient tolerance: Patient tolerated the procedure well with no immediate complications. Comments: Complicated by hemorrhage initially.  Wound powder was used to achieve initial hemostasis which then was cleaned rather extensively prior to wound exploration.     (including critical care time) DIAGNOSTIC STUDIES: Oxygen Saturation is 98% on room air, normal by my interpretation.    COORDINATION OF CARE: Medication Orders 1600: SUBLIMAZE injection 50 mcg Once   Labs Reviewed - No data to display No results found.   1. Laceration of hand     6:26 PM Wound closed, see above note.  Pt tolerated well.  Received morphine as well for pain after fentanyl wore off.    MDM  I personally  performed the services described in this documentation, which was scribed in my presence. The recorded information has been reviewed and considered.  4:00 PM Pt with significant brisk bleeding from hand laceration.  Was not pulsatile and blood dark when I saw it, but clinically felt like a significant portion of or a large vein was lacerated.  Good strength and normal gross sensation distally to thumb and second finger.  Due to brisk bleeding, Wound Seal pour pack utilized, held pressure with improvement in bleeding.  Pt given fentanyl for pain and secondary  anxiety relief.  Bleeding seems to have slowed and stopped.  Will monitor for a while.  Will then need to try to clean and explore due to the laceration being somewhat gaping.         Theresa [name]. Brynnly Bonet, MD 01/06/12 1827

## 2012-01-06 NOTE — Discharge Instructions (Signed)

## 2012-01-08 ENCOUNTER — Telehealth: Payer: Self-pay | Admitting: *Deleted

## 2012-01-08 ENCOUNTER — Other Ambulatory Visit: Payer: Self-pay | Admitting: *Deleted

## 2012-01-08 NOTE — Telephone Encounter (Signed)
This RN spoke with pt per her call and inquiry post MD review of concerns.  Per MD pt may obtain a tetanus shot due to recent injury.  Discussed also her recent call from the [facility] clinic stating she has a 1% return of B cells. This RN inquired per md of need for rituxin infusion.  Theresa [name] states recommendation per [facility] is to monitor the ANCA which is currently negative on a monthly basis.  " they said when it goes positive I should resume the rituxin."  Pt will need lab on 01/24/2012 and will bring kit with her for ANCA.  This note will be left for MD review as well as request for lab appt sent to schedulers.

## 2012-01-09 ENCOUNTER — Telehealth: Payer: Self-pay | Admitting: Oncology

## 2012-01-09 NOTE — Telephone Encounter (Signed)
S/w the pt and she is aware of her lab appt this month

## 2012-01-24 ENCOUNTER — Other Ambulatory Visit: Payer: 59 | Admitting: Lab

## 2012-01-24 ENCOUNTER — Ambulatory Visit: Payer: 59 | Admitting: Physician Assistant

## 2012-02-21 ENCOUNTER — Telehealth: Payer: Self-pay | Admitting: *Deleted

## 2012-02-21 ENCOUNTER — Encounter: Payer: Self-pay | Admitting: *Deleted

## 2012-02-21 NOTE — Telephone Encounter (Signed)
Patient confirmed over the phone confirmed all the appointments from 02-23-2012 thru 07-22-2012

## 2012-02-23 ENCOUNTER — Other Ambulatory Visit (HOSPITAL_BASED_OUTPATIENT_CLINIC_OR_DEPARTMENT_OTHER): Payer: 59 | Admitting: Lab

## 2012-02-23 ENCOUNTER — Other Ambulatory Visit (HOSPITAL_COMMUNITY): Admission: RE | Admit: 2012-02-23 | Payer: 59 | Source: Ambulatory Visit | Admitting: Oncology

## 2012-02-23 ENCOUNTER — Other Ambulatory Visit: Payer: Self-pay | Admitting: Oncology

## 2012-02-23 ENCOUNTER — Other Ambulatory Visit: Payer: 59

## 2012-02-23 DIAGNOSIS — M3130 Wegener's granulomatosis without renal involvement: Secondary | ICD-10-CM

## 2012-02-23 LAB — CBC WITH DIFFERENTIAL/PLATELET
Basophils Absolute: 0.1 10*3/uL (ref 0.0–0.1)
Eosinophils Absolute: 0.1 10*3/uL (ref 0.0–0.5)
HGB: 13.4 g/dL (ref 11.6–15.9)
LYMPH%: 32 % (ref 14.0–49.7)
MCV: 90.5 fL (ref 79.5–101.0)
MONO%: 8.5 % (ref 0.0–14.0)
NEUT#: 3.8 10*3/uL (ref 1.5–6.5)
Platelets: 310 10*3/uL (ref 145–400)
RDW: 13.3 % (ref 11.2–14.5)

## 2012-02-24 LAB — IGG, IGA, IGM
IgA: 150 mg/dL (ref 69–380)
IgG (Immunoglobin G), Serum: 726 mg/dL (ref 690–1700)
IgM, Serum: 43 mg/dL — ABNORMAL LOW (ref 52–322)

## 2012-02-24 LAB — COMPREHENSIVE METABOLIC PANEL
Albumin: 3.7 g/dL (ref 3.5–5.2)
Alkaline Phosphatase: 48 U/L (ref 39–117)
BUN: 13 mg/dL (ref 6–23)
CO2: 24 mEq/L (ref 19–32)
Glucose, Bld: 95 mg/dL (ref 70–99)
Potassium: 4.5 mEq/L (ref 3.5–5.3)

## 2012-02-26 ENCOUNTER — Other Ambulatory Visit: Payer: 59 | Admitting: Lab

## 2012-03-06 ENCOUNTER — Other Ambulatory Visit: Payer: Self-pay | Admitting: Oncology

## 2012-03-25 ENCOUNTER — Other Ambulatory Visit: Payer: 59 | Admitting: Lab

## 2012-03-25 ENCOUNTER — Other Ambulatory Visit: Payer: Self-pay | Admitting: *Deleted

## 2012-03-25 DIAGNOSIS — M3130 Wegener's granulomatosis without renal involvement: Secondary | ICD-10-CM

## 2012-04-01 ENCOUNTER — Other Ambulatory Visit: Payer: Self-pay | Admitting: *Deleted

## 2012-04-02 ENCOUNTER — Telehealth: Payer: Self-pay | Admitting: *Deleted

## 2012-04-02 ENCOUNTER — Other Ambulatory Visit (HOSPITAL_BASED_OUTPATIENT_CLINIC_OR_DEPARTMENT_OTHER): Payer: 59 | Admitting: Lab

## 2012-04-02 ENCOUNTER — Other Ambulatory Visit (HOSPITAL_COMMUNITY)
Admission: RE | Admit: 2012-04-02 | Discharge: 2012-04-02 | Disposition: A | Discharge status: Home / Self Care | Payer: 59 | Source: Ambulatory Visit | Attending: Oncology | Admitting: Oncology

## 2012-04-02 DIAGNOSIS — M3130 Wegener's granulomatosis without renal involvement: Secondary | ICD-10-CM

## 2012-04-02 LAB — CBC WITH DIFFERENTIAL/PLATELET
BASO%: 0.6 % (ref 0.0–2.0)
Basophils Absolute: 0.1 10*3/uL (ref 0.0–0.1)
EOS%: 0.5 % (ref 0.0–7.0)
HGB: 13.1 g/dL (ref 11.6–15.9)
MCH: 29.1 pg (ref 25.1–34.0)
MCHC: 32.2 g/dL (ref 31.5–36.0)
MONO%: 6.7 % (ref 0.0–14.0)
RBC: 4.48 10*6/uL (ref 3.70–5.45)
RDW: 13 % (ref 11.2–14.5)
lymph#: 1.7 10*3/uL (ref 0.9–3.3)

## 2012-04-02 NOTE — Telephone Encounter (Signed)
left voice message to inform the patient of the new date and time of the lab only appointment for 04-02-2012

## 2012-04-03 LAB — COMPREHENSIVE METABOLIC PANEL
ALT: 15 U/L (ref 0–35)
AST: 14 U/L (ref 0–37)
Albumin: 4.3 g/dL (ref 3.5–5.2)
Alkaline Phosphatase: 49 U/L (ref 39–117)
Calcium: 9.4 mg/dL (ref 8.4–10.5)
Chloride: 105 mEq/L (ref 96–112)
Potassium: 4.3 mEq/L (ref 3.5–5.3)
Sodium: 139 mEq/L (ref 135–145)
Total Protein: 6.3 g/dL (ref 6.0–8.3)

## 2012-04-10 ENCOUNTER — Other Ambulatory Visit: Payer: 59

## 2012-04-16 ENCOUNTER — Ambulatory Visit: Payer: 59 | Admitting: Oncology

## 2012-04-25 ENCOUNTER — Other Ambulatory Visit: Payer: Self-pay | Admitting: Oncology

## 2012-04-25 ENCOUNTER — Other Ambulatory Visit (HOSPITAL_COMMUNITY)
Admission: RE | Admit: 2012-04-25 | Discharge: 2012-04-25 | Disposition: A | Discharge status: Home / Self Care | Payer: 59 | Source: Ambulatory Visit | Attending: Oncology | Admitting: Oncology

## 2012-04-25 ENCOUNTER — Other Ambulatory Visit (HOSPITAL_BASED_OUTPATIENT_CLINIC_OR_DEPARTMENT_OTHER): Payer: 59 | Admitting: Lab

## 2012-04-25 DIAGNOSIS — M3130 Wegener's granulomatosis without renal involvement: Secondary | ICD-10-CM

## 2012-04-25 LAB — CBC & DIFF AND RETIC
Basophils Absolute: 0 10*3/uL (ref 0.0–0.1)
EOS%: 1.9 % (ref 0.0–7.0)
Eosinophils Absolute: 0.1 10*3/uL (ref 0.0–0.5)
HCT: 41.4 % (ref 34.8–46.6)
HGB: 13.8 g/dL (ref 11.6–15.9)
Immature Retic Fract: 5.3 % (ref 1.60–10.00)
MCH: 29.6 pg (ref 25.1–34.0)
MCV: 88.7 fL (ref 79.5–101.0)
MONO%: 8.9 % (ref 0.0–14.0)
NEUT#: 3.4 10*3/uL (ref 1.5–6.5)
NEUT%: 54.5 % (ref 38.4–76.8)
RDW: 13.3 % (ref 11.2–14.5)
Retic Ct Abs: 66.31 10*3/uL (ref 33.70–90.70)

## 2012-04-25 LAB — COMPREHENSIVE METABOLIC PANEL
ALT: 18 U/L (ref 0–35)
AST: 15 U/L (ref 0–37)
Albumin: 3.8 g/dL (ref 3.5–5.2)
CO2: 27 mEq/L (ref 19–32)
Calcium: 9.3 mg/dL (ref 8.4–10.5)
Chloride: 107 mEq/L (ref 96–112)
Creatinine, Ser: 0.93 mg/dL (ref 0.50–1.10)
Potassium: 4 mEq/L (ref 3.5–5.3)

## 2012-04-29 LAB — FLOW CYTOMETRY

## 2012-04-30 ENCOUNTER — Other Ambulatory Visit: Payer: Self-pay | Admitting: Oncology

## 2012-05-26 ENCOUNTER — Other Ambulatory Visit: Payer: Self-pay | Admitting: Oncology

## 2012-05-26 DIAGNOSIS — M3130 Wegener's granulomatosis without renal involvement: Secondary | ICD-10-CM

## 2012-05-27 ENCOUNTER — Other Ambulatory Visit (HOSPITAL_BASED_OUTPATIENT_CLINIC_OR_DEPARTMENT_OTHER): Payer: Medicare Other | Admitting: Lab

## 2012-05-27 ENCOUNTER — Other Ambulatory Visit (HOSPITAL_COMMUNITY)
Admission: RE | Admit: 2012-05-27 | Discharge: 2012-05-27 | Disposition: A | Discharge status: Home / Self Care | Payer: Medicare Other | Source: Ambulatory Visit | Attending: Oncology | Admitting: Oncology

## 2012-05-27 DIAGNOSIS — M3130 Wegener's granulomatosis without renal involvement: Secondary | ICD-10-CM

## 2012-05-27 LAB — COMPREHENSIVE METABOLIC PANEL (CC13)
ALT: 22 U/L (ref 0–55)
AST: 17 U/L (ref 5–34)
Albumin: 3.9 g/dL (ref 3.5–5.0)
Alkaline Phosphatase: 53 U/L (ref 40–150)
BUN: 12 mg/dL (ref 7.0–26.0)
Calcium: 10.1 mg/dL (ref 8.4–10.4)
Chloride: 107 mEq/L (ref 98–107)
Potassium: 4.3 mEq/L (ref 3.5–5.1)
Sodium: 142 mEq/L (ref 136–145)
Total Protein: 6.7 g/dL (ref 6.4–8.3)

## 2012-05-27 LAB — CBC WITH DIFFERENTIAL/PLATELET
BASO%: 1 % (ref 0.0–2.0)
EOS%: 1 % (ref 0.0–7.0)
MCH: 30 pg (ref 25.1–34.0)
MCHC: 32.6 g/dL (ref 31.5–36.0)
MCV: 91.9 fL (ref 79.5–101.0)
MONO%: 7.6 % (ref 0.0–14.0)
RBC: 4.55 10*6/uL (ref 3.70–5.45)
RDW: 13.1 % (ref 11.2–14.5)
lymph#: 2.3 10*3/uL (ref 0.9–3.3)

## 2012-06-14 ENCOUNTER — Other Ambulatory Visit: Payer: Self-pay | Admitting: *Deleted

## 2012-06-18 ENCOUNTER — Other Ambulatory Visit: Payer: Self-pay | Admitting: *Deleted

## 2012-06-25 ENCOUNTER — Other Ambulatory Visit: Payer: 59 | Admitting: Lab

## 2012-06-29 ENCOUNTER — Other Ambulatory Visit: Payer: Self-pay | Admitting: Oncology

## 2012-06-29 DIAGNOSIS — M3130 Wegener's granulomatosis without renal involvement: Secondary | ICD-10-CM

## 2012-07-01 ENCOUNTER — Encounter: Payer: Self-pay | Admitting: Oncology

## 2012-07-01 ENCOUNTER — Other Ambulatory Visit: Payer: Self-pay | Admitting: Oncology

## 2012-07-02 ENCOUNTER — Telehealth: Payer: Self-pay | Admitting: Oncology

## 2012-07-02 NOTE — Telephone Encounter (Signed)
Sent letter to pt from Dr.Magrinat.

## 2012-07-15 ENCOUNTER — Other Ambulatory Visit: Payer: Self-pay | Admitting: *Deleted

## 2012-07-15 ENCOUNTER — Other Ambulatory Visit: Payer: 59 | Admitting: Lab

## 2012-07-18 ENCOUNTER — Telehealth: Payer: Self-pay | Admitting: *Deleted

## 2012-07-18 NOTE — Telephone Encounter (Signed)
Dr. Daisy Floro from [facility], [phone] Cheryl)order 48 Holter monitor for Theresa [name]. Left message for patient to call and schedule on the week of 07/29/12. Per Tara Holter monitor are already scheduled for week on 07/22/12. Next available Holter monitor will be the week of 07/29/12.

## 2012-07-19 ENCOUNTER — Encounter: Payer: Self-pay | Admitting: Cardiovascular Disease

## 2012-07-19 ENCOUNTER — Encounter (INDEPENDENT_AMBULATORY_CARE_PROVIDER_SITE_OTHER): Payer: Medicare Other

## 2012-07-19 DIAGNOSIS — R002 Palpitations: Secondary | ICD-10-CM

## 2012-07-22 ENCOUNTER — Other Ambulatory Visit: Payer: 59 | Admitting: Lab

## 2012-07-22 ENCOUNTER — Other Ambulatory Visit (HOSPITAL_BASED_OUTPATIENT_CLINIC_OR_DEPARTMENT_OTHER): Payer: Medicare Other | Admitting: Lab

## 2012-07-22 ENCOUNTER — Telehealth: Payer: Self-pay | Admitting: Oncology

## 2012-07-22 ENCOUNTER — Ambulatory Visit (HOSPITAL_BASED_OUTPATIENT_CLINIC_OR_DEPARTMENT_OTHER): Payer: Medicare Other | Admitting: Oncology

## 2012-07-22 VITALS — BP 123/86 | HR 64 | Temp 98.1°F | Resp 20 | Ht 67.0 in | Wt 162.1 lb

## 2012-07-22 DIAGNOSIS — M3130 Wegener's granulomatosis without renal involvement: Secondary | ICD-10-CM

## 2012-07-22 LAB — CBC WITH DIFFERENTIAL/PLATELET
EOS%: 0.5 % (ref 0.0–7.0)
Eosinophils Absolute: 0 10*3/uL (ref 0.0–0.5)
LYMPH%: 14.4 % (ref 14.0–49.7)
MCH: 30.7 pg (ref 25.1–34.0)
MCV: 91.6 fL (ref 79.5–101.0)
MONO%: 8 % (ref 0.0–14.0)
NEUT#: 6.9 10*3/uL — ABNORMAL HIGH (ref 1.5–6.5)
Platelets: 325 10*3/uL (ref 145–400)
RBC: 4.34 10*6/uL (ref 3.70–5.45)

## 2012-07-22 NOTE — Progress Notes (Signed)
ID: Theresa [name]  DOB: Nov 10, 1960  MR#: [id number]  CSN#: [id number]  PCP: Daisy Floro, MD GYN: SU: OTHER MD: Andi Hence (fax [phone]); Pincus Badder; Marcelyn Bruins; Clare Charon; Willette Pa (fax [phone])  HPI: [name] tells me her diagnosis of Wegener was initially made at the [facility] by Dr. Ozella Rocks in 1999.  She has been treated in the past with Cytoxan and prednisone, methotrexate very briefly, Cytoxan orally, then Imuran for some time, and then off treatment for some years.  She was started on prednisone alone in 2009, and then in 06/2008 the azathioprine was added again at 150 mg daily, and variable doses of prednisone.  It is felt that she failed remission maintenance therapy since she could not reduce her dose of prednisone below 20 mg daily without having a resurgence of symptoms, and therefore she has been started on Rituxan with the first dose given at the [facility] on 06/24.  The patient tolerated the treatment quite well.  She was referred here to facilitate further weekly Rituxan treatments as needed.  PMHx: Associated with the Wegener, the past medical history is significant for prior tracheostomy, prior sinus surgery x 2, prior right tear duct surgery at [facility] for epiphora (with subsequent re-obstruction).  She also has a history of nasal saddling and inflammation left greater than right.  She also gets occasional thrush problems because of the steroid treatment.  Aside from the Wegener, she has a history of osteopenia, history of migraines, history of GERD, history of mitral valve prolapse, and a history of MRSA infection remotely (1999).  Interval History:   There continues to be a great deal of stress in her situation. She has been separated now 2 years, but divorce is not yet final, and she is not on her mortgage (of it is under her ex-husband's name). Her house is likely to be for close to in the next few months.  ROS: She has had night sweats  frequently, which may represent hot flashes. She sleeps very poorly and describes herself is moderately fatigued. She has joint pain and cramps frequently, and palpitations, which are being evaluated with a Holter, just completed. She has significant sinus problems, hoarseness, and a cough occasionally productive of clear phlegm. She bruises easily. She has significant headaches, for which she was recently prescribed Relpax. Otherwise a detailed review of systems is stable.  Allergies  Allergen Reactions  . Codeine Itching  . Vancomycin Hives  . Tape Rash    Current Outpatient Prescriptions  Medication Sig Dispense Refill  . albuterol (PROVENTIL HFA;VENTOLIN HFA) 108 (90 BASE) MCG/ACT inhaler Inhale 2 puffs into the lungs every 6 (six) hours as needed. For shortness of breath or wheezing      . Calcium Carbonate-Vitamin D (CALCIUM 600+D) 600-400 MG-UNIT per tablet Take 1 tablet by mouth daily.      Marland Kitchen esomeprazole (NEXIUM) 40 MG capsule Take 40 mg by mouth daily before breakfast.      . fluticasone (FLONASE) 50 MCG/ACT nasal spray Place 2 sprays into the nose daily.      . fluticasone (FLOVENT HFA) 110 MCG/ACT inhaler Inhale 2 puffs into the lungs daily.      Marland Kitchen LORazepam (ATIVAN) 1 MG tablet Take 1 mg by mouth at bedtime as needed. For sleep      . Omega-3 Fatty Acids (FISH OIL PO) Take 1 capsule by mouth 3 (three) times daily.      . predniSONE (DELTASONE) 5 MG tablet Take 5 mg by  mouth daily.      . ranitidine (ZANTAC) 150 MG capsule Take 150 mg by mouth 2 (two) times daily as needed. For acid reflux      . sulfamethoxazole-trimethoprim (BACTRIM,SEPTRA) 400-80 MG per tablet Take 1 tablet by mouth every morning.      . Vitamin D, Ergocalciferol, (DRISDOL) 50000 UNITS CAPS Take 50,000 Units by mouth every 7 (seven) days. On Sundays        FAMILY HISTORY:  The patient's father is alive in his early 92s.  The patient's mother is alive in her late 82s.  The patient has 3 sisters.  There is no one  else in the family with autoimmune disease or with cancer.  GYNECOLOGIC HISTORY:  She is GX, P2, first pregnancy to term age 10.  Last menstrual period was June of 2012, and she had none for many months before that.  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  Her former husband, Gregary Signs, works for the [employer] for [employer], where he is the [employer].  They have a son currently in college and a daughter at home with the patient.  They attend [employer] here in [place].  HEALTH MAINTENANCE:  She has never had a colonoscopy.  She is not a smoker, and never abused tobacco or alcohol.  She tells me her cholesterol is good.  She has a history of osteopenia. She is up to date on mammography (2013)  Objective: Middle-aged white woman who appears stressed  Filed Vitals:   07/22/12 1041  BP: 123/86  Pulse: 64  Temp: 98.1 F (36.7 C)  Resp: 20    BMI: Body mass index is 25.39 kg/(m^2).   ECOG FS: 1  Physical Exam:   Sclerae unicteric  Oropharynx clear, no thrush evident  No peripheral adenopathy  Lungs clear -- no rales or rhonchi, no wheezes  Heart regular rate and rhythm  Abdomen benign  MSK no focal spinal tenderness, no peripheral edema  Neuro nonfocal  Breast exam: Deferred  Lab Results:      Chemistry      Component Value Date/Time   NA 142 05/27/2012 1146   NA 142 04/25/2012 1023   K 4.3 05/27/2012 1146   K 4.0 04/25/2012 1023   CL 107 05/27/2012 1146   CL 107 04/25/2012 1023   CO2 25 05/27/2012 1146   CO2 27 04/25/2012 1023   BUN 12.0 05/27/2012 1146   BUN 13 04/25/2012 1023   CREATININE 0.9 05/27/2012 1146   CREATININE 0.93 04/25/2012 1023      Component Value Date/Time   CALCIUM 10.1 05/27/2012 1146   CALCIUM 9.3 04/25/2012 1023   ALKPHOS 53 05/27/2012 1146   ALKPHOS 48 04/25/2012 1023   AST 17 05/27/2012 1146   AST 15 04/25/2012 1023   ALT 22 05/27/2012 1146   ALT  18 04/25/2012 1023   BILITOT 0.40 05/27/2012 1146   BILITOT 0.3 04/25/2012 1023       Lab Results  Component Value Date   WBC 9.9 05/27/2012   HGB 13.7 05/27/2012   HCT 41.9 05/27/2012   MCV 91.9 05/27/2012   PLT 312 05/27/2012   NEUTROABS 6.7* 05/27/2012    Studies/Results:  No new results found.  Assessment: A 51 year old [place] woman with a history of Wegener's granulomatosis initially diagnosed in 1999, variously treated as noted above in the history of  present illness,s/p Rituxan x4 completed July 2010, currently on low-dose prednisone maintenance.   Plan: We were able to help clear some of the bills she was getting for lab tests she did not need. Currently we are facilitating her labwork: She has her ANCA drawn every 28 days, next one will be December 2. She had many questions regarding Prozac, recently started by her primary care MD, and I suggested instead of stopping it she take 10 mg every other day. If she tolerates that she can take 10 mg daily for another 2 weeks and if she tolerates that she should try to go to 20 mg daily. I do think this type of medication should be helpful to her in this very difficult period in her life. Otherwise she will see me again in 6 months. She knows to call for any problems that we may be of help with before then.  Blas Riches C 07/22/2012

## 2012-07-22 NOTE — Telephone Encounter (Signed)
gve the pt her dec-may 2014 appt calendar

## 2012-07-31 ENCOUNTER — Other Ambulatory Visit: Payer: Self-pay | Admitting: Emergency Medicine

## 2012-07-31 ENCOUNTER — Other Ambulatory Visit: Payer: Self-pay | Admitting: Oncology

## 2012-08-02 ENCOUNTER — Other Ambulatory Visit: Payer: Self-pay | Admitting: *Deleted

## 2012-08-02 ENCOUNTER — Telehealth: Payer: Self-pay | Admitting: Oncology

## 2012-08-02 NOTE — Telephone Encounter (Signed)
S/w the pt and she is aware of her dec 23rd lab appt and to pick up a revised schedule for dec-June 2014.

## 2012-08-05 ENCOUNTER — Other Ambulatory Visit: Payer: 59 | Admitting: Lab

## 2012-08-26 ENCOUNTER — Other Ambulatory Visit: Payer: 59 | Admitting: Lab

## 2012-08-26 DIAGNOSIS — M3130 Wegener's granulomatosis without renal involvement: Secondary | ICD-10-CM

## 2012-09-09 ENCOUNTER — Other Ambulatory Visit: Payer: 59 | Admitting: Lab

## 2012-09-19 ENCOUNTER — Other Ambulatory Visit: Payer: 59 | Admitting: Lab

## 2012-09-23 ENCOUNTER — Other Ambulatory Visit: Payer: 59 | Admitting: Lab

## 2012-09-23 ENCOUNTER — Telehealth: Payer: Self-pay | Admitting: *Deleted

## 2012-09-23 NOTE — Telephone Encounter (Signed)
Message left by pt stating she needs to cancel appointment today for lab due to she has not received lab kit from the [facility]. Pt will call for an appointment when she receives lab kit.

## 2012-10-03 ENCOUNTER — Other Ambulatory Visit: Payer: Self-pay | Admitting: *Deleted

## 2012-10-03 NOTE — Progress Notes (Signed)
Pt called to this RN to request an appt for lab on 10/07/2012 now that she has received lab kit from the [facility] clinic. She would like to then reschedule labs to every 30 days approximately from that date.  Appointment scheduled for this Monday and request sent to scheduling per need to reschedule future lab appointments.

## 2012-10-07 ENCOUNTER — Ambulatory Visit (HOSPITAL_BASED_OUTPATIENT_CLINIC_OR_DEPARTMENT_OTHER): Payer: 59 | Admitting: Lab

## 2012-10-07 ENCOUNTER — Other Ambulatory Visit: Payer: 59 | Admitting: Lab

## 2012-10-07 DIAGNOSIS — M3130 Wegener's granulomatosis without renal involvement: Secondary | ICD-10-CM

## 2012-10-07 LAB — CBC WITH DIFFERENTIAL/PLATELET
BASO%: 0.3 % (ref 0.0–2.0)
LYMPH%: 29.8 % (ref 14.0–49.7)
MCHC: 33.3 g/dL (ref 31.5–36.0)
MCV: 88.8 fL (ref 79.5–101.0)
MONO#: 1 10*3/uL — ABNORMAL HIGH (ref 0.1–0.9)
MONO%: 8.1 % (ref 0.0–14.0)
Platelets: 434 10*3/uL — ABNORMAL HIGH (ref 145–400)
RBC: 4.56 10*6/uL (ref 3.70–5.45)
RDW: 13.2 % (ref 11.2–14.5)
WBC: 11.8 10*3/uL — ABNORMAL HIGH (ref 3.9–10.3)
nRBC: 0 % (ref 0–0)

## 2012-10-07 LAB — COMPREHENSIVE METABOLIC PANEL (CC13)
ALT: 18 U/L (ref 0–55)
Albumin: 3.4 g/dL — ABNORMAL LOW (ref 3.5–5.0)
CO2: 25 mEq/L (ref 22–29)
Glucose: 79 mg/dl (ref 70–99)
Potassium: 3.8 mEq/L (ref 3.5–5.1)
Sodium: 140 mEq/L (ref 136–145)
Total Bilirubin: 0.29 mg/dL (ref 0.20–1.20)
Total Protein: 6.7 g/dL (ref 6.4–8.3)

## 2012-10-14 ENCOUNTER — Other Ambulatory Visit: Payer: 59 | Admitting: Lab

## 2012-10-18 ENCOUNTER — Other Ambulatory Visit: Payer: Self-pay | Admitting: *Deleted

## 2012-10-21 ENCOUNTER — Other Ambulatory Visit: Payer: 59 | Admitting: Lab

## 2012-10-25 ENCOUNTER — Telehealth: Payer: Self-pay | Admitting: Oncology

## 2012-10-25 NOTE — Telephone Encounter (Signed)
lmonvm for pt on both home/cell re next appt for 3/6. Pt to get schedule when she comes in.

## 2012-11-04 ENCOUNTER — Other Ambulatory Visit: Payer: 59 | Admitting: Lab

## 2012-11-06 ENCOUNTER — Other Ambulatory Visit: Payer: 59 | Admitting: Lab

## 2012-11-07 ENCOUNTER — Other Ambulatory Visit (HOSPITAL_BASED_OUTPATIENT_CLINIC_OR_DEPARTMENT_OTHER): Payer: 59 | Admitting: Lab

## 2012-11-07 DIAGNOSIS — M3130 Wegener's granulomatosis without renal involvement: Secondary | ICD-10-CM

## 2012-11-07 LAB — CBC WITH DIFFERENTIAL/PLATELET
EOS%: 1.6 % (ref 0.0–7.0)
Eosinophils Absolute: 0.1 10*3/uL (ref 0.0–0.5)
LYMPH%: 30.5 % (ref 14.0–49.7)
MCHC: 33.4 g/dL (ref 31.5–36.0)
MCV: 90.2 fL (ref 79.5–101.0)
MONO#: 0.8 10*3/uL (ref 0.1–0.9)
MONO%: 11 % (ref 0.0–14.0)
NEUT#: 4.2 10*3/uL (ref 1.5–6.5)
Platelets: 299 10*3/uL (ref 145–400)
RBC: 4.57 10*6/uL (ref 3.70–5.45)
RDW: 13.8 % (ref 11.2–14.5)
WBC: 7.5 10*3/uL (ref 3.9–10.3)

## 2012-11-07 LAB — COMPREHENSIVE METABOLIC PANEL (CC13)
AST: 15 U/L (ref 5–34)
BUN: 15.3 mg/dL (ref 7.0–26.0)
CO2: 27 mEq/L (ref 22–29)
Calcium: 9.6 mg/dL (ref 8.4–10.4)
Chloride: 106 mEq/L (ref 98–107)
Creatinine: 0.9 mg/dL (ref 0.6–1.1)

## 2012-11-11 ENCOUNTER — Telehealth: Payer: Self-pay | Admitting: *Deleted

## 2012-11-11 NOTE — Telephone Encounter (Signed)
sw pt about her labs..pt understands that her labs are every 4 weeks. [facility]  have [facility] to send her box around her lab visits...td

## 2012-11-18 ENCOUNTER — Other Ambulatory Visit: Payer: 59 | Admitting: Lab

## 2012-11-29 ENCOUNTER — Other Ambulatory Visit: Payer: 59 | Admitting: Lab

## 2012-12-05 ENCOUNTER — Other Ambulatory Visit: Payer: 59 | Admitting: Lab

## 2012-12-09 ENCOUNTER — Other Ambulatory Visit: Payer: 59 | Admitting: Lab

## 2012-12-22 ENCOUNTER — Other Ambulatory Visit: Payer: 59 | Admitting: Lab

## 2012-12-23 ENCOUNTER — Other Ambulatory Visit: Payer: 59 | Admitting: Lab

## 2013-01-02 ENCOUNTER — Other Ambulatory Visit: Payer: 59

## 2013-01-06 ENCOUNTER — Other Ambulatory Visit: Payer: 59 | Admitting: Lab

## 2013-01-09 ENCOUNTER — Telehealth: Payer: Self-pay | Admitting: Oncology

## 2013-01-09 ENCOUNTER — Ambulatory Visit: Payer: 59 | Admitting: Oncology

## 2013-01-09 ENCOUNTER — Ambulatory Visit (HOSPITAL_BASED_OUTPATIENT_CLINIC_OR_DEPARTMENT_OTHER): Payer: Medicare Other | Admitting: Oncology

## 2013-01-09 ENCOUNTER — Telehealth: Payer: Self-pay | Admitting: *Deleted

## 2013-01-09 ENCOUNTER — Other Ambulatory Visit: Payer: 59 | Admitting: Lab

## 2013-01-09 VITALS — BP 117/73 | HR 63 | Temp 97.9°F | Resp 20 | Ht 67.0 in | Wt 164.1 lb

## 2013-01-09 DIAGNOSIS — M899 Disorder of bone, unspecified: Secondary | ICD-10-CM

## 2013-01-09 DIAGNOSIS — R5381 Other malaise: Secondary | ICD-10-CM

## 2013-01-09 DIAGNOSIS — M3130 Wegener's granulomatosis without renal involvement: Secondary | ICD-10-CM

## 2013-01-09 DIAGNOSIS — R5383 Other fatigue: Secondary | ICD-10-CM

## 2013-01-09 DIAGNOSIS — M949 Disorder of cartilage, unspecified: Secondary | ICD-10-CM

## 2013-01-09 NOTE — Telephone Encounter (Signed)
appts made and printed...td 

## 2013-01-09 NOTE — Progress Notes (Signed)
ID: Theresa [name]  DOB: November 29, 1960  MR#: [id number]  CSN#: [id number]  PCP: Theresa Floro, MD GYN: SU: OTHER MD: Theresa [name] (fax [phone]); Pincus Badder; Marcelyn Bruins; Clare Charon; Willette Pa (fax [phone])  HPI: [name] tells me her diagnosis of Wegener was initially made at the [facility] by Dr. Ozella [name] in 1999.  She has been treated in the past with Cytoxan and prednisone, methotrexate very briefly, Cytoxan orally, then Imuran for some time, and then off treatment for some years.  She was started on prednisone alone in 2009, and then in 06/2008 the azathioprine was added again at 150 mg daily, and variable doses of prednisone.  It is felt that she failed remission maintenance therapy since she could not reduce her dose of prednisone below 20 mg daily without having [name] resurgence of symptoms, and therefore she has been started on Rituxan with the first dose given at the [facility] on 06/24.  The patient tolerated the treatment quite well.  She was referred here to facilitate further weekly Rituxan treatments as needed.  PMHx: Associated with the Wegener, the past medical history is significant for prior tracheostomy, prior sinus surgery x 2, prior right tear duct surgery at [name] for epiphora (with subsequent re-obstruction).  She also has [name] history of nasal saddling and inflammation left greater than right.  She also gets occasional thrush problems because of the steroid treatment.  Aside from the Wegener, she has [name] history of osteopenia, history of migraines, history of GERD, history of mitral valve prolapse, and [name] history of MRSA infection remotely (1999).  Interval History:   Theresa [name] returns for followup of for Wegener's. Since her last visit here, she was approved for disability. Also her divorce was finalized April 2014. She has [name] son at less than state, who is now staying frequently with his father. The patient's daughter, currently 81 years old, attends  [employer].  ROS: She sleeps poorly and describes herself is moderately fatigued. She has seasonal sinus problems, some hoarseness, [name] short of breath particularly when walking up stairs or up [name] slope, has occasional palpitations and heartburn. She bruises easily. She has problems with headaches and forgetfulness. Overall, [name] detailed review of systems today was stable.  Allergies  Allergen Reactions  . Codeine Itching  . Vancomycin Hives  . Tape Rash    Current Outpatient Prescriptions  Medication Sig Dispense Refill  . albuterol (PROVENTIL HFA;VENTOLIN HFA) 108 (90 BASE) MCG/ACT inhaler Inhale 2 puffs into the lungs every 6 (six) hours as needed. For shortness of breath or wheezing      . Calcium Carbonate-Vitamin D (CALCIUM 600+D) 600-400 MG-UNIT per tablet Take 1 tablet by mouth daily.      Marland Kitchen esomeprazole (NEXIUM) 40 MG capsule Take 40 mg by mouth daily before breakfast.      . fluticasone (FLONASE) 50 MCG/ACT nasal spray Place 2 sprays into the nose daily.      . fluticasone (FLOVENT HFA) 110 MCG/ACT inhaler Inhale 2 puffs into the lungs daily.      Marland Kitchen LORazepam (ATIVAN) 1 MG tablet Take 1 mg by mouth at bedtime as needed. For sleep      . Omega-3 Fatty Acids (FISH OIL PO) Take 1 capsule by mouth 3 (three) times daily.      . predniSONE (DELTASONE) 5 MG tablet Take 5 mg by mouth daily.      . ranitidine (ZANTAC) 150 MG capsule Take 150 mg by mouth 2 (two) times  daily as needed. For acid reflux      . sulfamethoxazole-trimethoprim (BACTRIM,SEPTRA) 400-80 MG per tablet Take 1 tablet by mouth every morning.      . Vitamin D, Ergocalciferol, (DRISDOL) 50000 UNITS CAPS Take 50,000 Units by mouth every 7 (seven) days. On Sundays       No current facility-administered medications for this visit.    FAMILY HISTORY:  The patient's father is alive in his early 20s.  The patient's mother is alive in her late 1s.  The patient has 3 sisters.  There is no one else in the family with  autoimmune disease or with cancer.  GYNECOLOGIC HISTORY:  She is GX, P2, first pregnancy to term age 5.  Last menstrual period was June of 2012, and she had none for many months before that.  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  Her former husband, Theresa [name], works for the [employer] for [employer], where he is the [employer].  They have [name] son currently in college and [name] daughter at home with the patient.  They attend [employer] here in [place].  HEALTH MAINTENANCE:  She has never had [name] colonoscopy.  She is not [name] smoker, and never abused tobacco or alcohol.  She tells me her cholesterol is good.  She has [name] history of osteopenia. She is up to date on mammography (2013)  Objective: Middle-aged white woman in no acute distress  Filed Vitals:   01/09/13 1122  BP: 117/73  Pulse: 63  Temp: 97.9 F (36.6 C)  Resp: 20    BMI: Body mass index is 25.7 kg/(m^2).   ECOG FS: 2  Physical Exam:   Sclerae unicteric  Oropharynx clear  No cervical or supraclavicular adenopathy  Lungs no rales or rhonchi, no wheezes, fair excursion bilaterally  Heart regular rate and rhythm  Abdomen soft, nontender, positive bowel sounds  MSK no focal spinal tenderness, no peripheral edema  Neuro nonfocal, well oriented, appropriate affect  Breast exam: Deferred  Lab Results:      Chemistry      Component Value Date/Time   NA 142 11/07/2012 0916   NA 142 04/25/2012 1023   K 3.8 11/07/2012 0916   K 4.0 04/25/2012 1023   CL 106 11/07/2012 0916   CL 107 04/25/2012 1023   CO2 27 11/07/2012 0916   CO2 27 04/25/2012 1023   BUN 15.3 11/07/2012 0916   BUN 13 04/25/2012 1023   CREATININE 0.9 11/07/2012 0916   CREATININE 0.93 04/25/2012 1023      Component Value Date/Time   CALCIUM 9.6 11/07/2012 0916   CALCIUM 9.3 04/25/2012 1023   ALKPHOS 59 11/07/2012 0916   ALKPHOS 48 04/25/2012 1023   AST 15 11/07/2012 0916   AST 15  04/25/2012 1023   ALT 19 11/07/2012 0916   ALT 18 04/25/2012 1023   BILITOT 0.34 11/07/2012 0916   BILITOT 0.3 04/25/2012 1023       Lab Results  Component Value Date   WBC 7.5 11/07/2012   HGB 13.8 11/07/2012   HCT 41.2 11/07/2012   MCV 90.2 11/07/2012   PLT 299 11/07/2012   NEUTROABS 4.2 11/07/2012    Studies/Results:  No results found.   Assessment: 52 y.o.  [place] woman with [name] history of Wegener's granulomatosis initially diagnosed in 1999, variously treated as noted above in the history of present illness, s/p  Rituxan x4 completed July 2010, currently on low-dose prednisone maintenance.   Plan: Abi was approved for disability, which is helping with the pills. The family situation is finally settling down enough that she can start looking to the future. She will soon have "an empty mass", which of course is threatening in the way, but also opens the door to multiple possibilities as she looks to her future.  As far as her Wegener's is concerned we will continue to follow her C-ANCA Labs every 4 weeks, and treat her with Rituxan as needed. She knows to call for any problems that may develop before her next visit here, which will be in one year.  MAGRINAT,GUSTAV C 01/09/2013

## 2013-01-11 ENCOUNTER — Other Ambulatory Visit: Payer: Self-pay | Admitting: Oncology

## 2013-01-11 DIAGNOSIS — M3130 Wegener's granulomatosis without renal involvement: Secondary | ICD-10-CM

## 2013-01-15 ENCOUNTER — Other Ambulatory Visit: Payer: 59 | Admitting: Lab

## 2013-01-20 ENCOUNTER — Other Ambulatory Visit: Payer: 59 | Admitting: Lab

## 2013-01-20 ENCOUNTER — Telehealth: Payer: Self-pay | Admitting: *Deleted

## 2013-01-20 ENCOUNTER — Other Ambulatory Visit: Payer: Medicare Other | Admitting: Lab

## 2013-01-20 ENCOUNTER — Other Ambulatory Visit: Payer: 59

## 2013-01-20 NOTE — Telephone Encounter (Signed)
Pt called stating that she has ivy and would like to r/s until Wednesday. gv appt d/t for 5.21.14 @ 11am.....td

## 2013-01-22 ENCOUNTER — Other Ambulatory Visit (HOSPITAL_BASED_OUTPATIENT_CLINIC_OR_DEPARTMENT_OTHER): Payer: Medicare Other | Admitting: Lab

## 2013-01-22 DIAGNOSIS — M3130 Wegener's granulomatosis without renal involvement: Secondary | ICD-10-CM

## 2013-01-22 LAB — CBC WITH DIFFERENTIAL/PLATELET
Basophils Absolute: 0.1 10*3/uL (ref 0.0–0.1)
HCT: 41.3 % (ref 34.8–46.6)
HGB: 13.6 g/dL (ref 11.6–15.9)
LYMPH%: 10.7 % — ABNORMAL LOW (ref 14.0–49.7)
MCH: 29.4 pg (ref 25.1–34.0)
MONO#: 0.3 10*3/uL (ref 0.1–0.9)
NEUT%: 86.1 % — ABNORMAL HIGH (ref 38.4–76.8)
Platelets: 320 10*3/uL (ref 145–400)
WBC: 11.3 10*3/uL — ABNORMAL HIGH (ref 3.9–10.3)
lymph#: 1.2 10*3/uL (ref 0.9–3.3)

## 2013-01-30 ENCOUNTER — Other Ambulatory Visit: Payer: 59

## 2013-02-03 ENCOUNTER — Other Ambulatory Visit: Payer: 59 | Admitting: Lab

## 2013-02-07 ENCOUNTER — Other Ambulatory Visit: Payer: 59 | Admitting: Lab

## 2013-02-12 ENCOUNTER — Telehealth: Payer: Self-pay | Admitting: Oncology

## 2013-02-12 NOTE — Telephone Encounter (Signed)
pt called to r/s lab due to going out of town.Marland KitchenMarland KitchenMarland KitchenDone

## 2013-02-17 ENCOUNTER — Other Ambulatory Visit: Payer: 59 | Admitting: Lab

## 2013-02-17 ENCOUNTER — Other Ambulatory Visit: Payer: Medicare Other | Admitting: Lab

## 2013-02-21 ENCOUNTER — Other Ambulatory Visit (HOSPITAL_BASED_OUTPATIENT_CLINIC_OR_DEPARTMENT_OTHER): Payer: Medicare Other

## 2013-02-21 DIAGNOSIS — M3130 Wegener's granulomatosis without renal involvement: Secondary | ICD-10-CM

## 2013-02-21 LAB — CBC WITH DIFFERENTIAL/PLATELET
Basophils Absolute: 0.1 10*3/uL (ref 0.0–0.1)
EOS%: 0.6 % (ref 0.0–7.0)
HCT: 41.3 % (ref 34.8–46.6)
HGB: 13.9 g/dL (ref 11.6–15.9)
LYMPH%: 13.3 % — ABNORMAL LOW (ref 14.0–49.7)
MCH: 29.8 pg (ref 25.1–34.0)
MCHC: 33.6 g/dL (ref 31.5–36.0)
MCV: 88.8 fL (ref 79.5–101.0)
MONO%: 5.7 % (ref 0.0–14.0)
NEUT%: 79.7 % — ABNORMAL HIGH (ref 38.4–76.8)

## 2013-02-27 ENCOUNTER — Other Ambulatory Visit: Payer: 59

## 2013-03-24 ENCOUNTER — Other Ambulatory Visit (HOSPITAL_BASED_OUTPATIENT_CLINIC_OR_DEPARTMENT_OTHER): Payer: Medicare Other | Admitting: Lab

## 2013-03-24 DIAGNOSIS — M3130 Wegener's granulomatosis without renal involvement: Secondary | ICD-10-CM

## 2013-03-24 LAB — CBC WITH DIFFERENTIAL/PLATELET
BASO%: 1 % (ref 0.0–2.0)
Basophils Absolute: 0.1 10*3/uL (ref 0.0–0.1)
EOS%: 1.1 % (ref 0.0–7.0)
MCH: 30.1 pg (ref 25.1–34.0)
MCHC: 33.7 g/dL (ref 31.5–36.0)
MCV: 89.3 fL (ref 79.5–101.0)
MONO%: 10.2 % (ref 0.0–14.0)
RBC: 4.43 10*6/uL (ref 3.70–5.45)
RDW: 13.5 % (ref 11.2–14.5)

## 2013-04-08 ENCOUNTER — Encounter: Payer: Self-pay | Admitting: Oncology

## 2013-04-09 ENCOUNTER — Telehealth: Payer: Self-pay | Admitting: Oncology

## 2013-04-09 NOTE — Telephone Encounter (Signed)
Faxed pt medical records to Dr Hope Pigeon

## 2013-04-21 ENCOUNTER — Other Ambulatory Visit (HOSPITAL_BASED_OUTPATIENT_CLINIC_OR_DEPARTMENT_OTHER): Payer: Medicare Other | Admitting: Lab

## 2013-04-21 DIAGNOSIS — M3130 Wegener's granulomatosis without renal involvement: Secondary | ICD-10-CM

## 2013-04-21 LAB — CBC WITH DIFFERENTIAL/PLATELET
EOS%: 1.5 % (ref 0.0–7.0)
Eosinophils Absolute: 0.1 10*3/uL (ref 0.0–0.5)
MCV: 89.8 fL (ref 79.5–101.0)
MONO%: 9.2 % (ref 0.0–14.0)
NEUT#: 5 10*3/uL (ref 1.5–6.5)
RBC: 4.43 10*6/uL (ref 3.70–5.45)
RDW: 13.3 % (ref 11.2–14.5)

## 2013-05-19 ENCOUNTER — Other Ambulatory Visit (HOSPITAL_BASED_OUTPATIENT_CLINIC_OR_DEPARTMENT_OTHER): Payer: Medicare Other

## 2013-05-19 DIAGNOSIS — M3130 Wegener's granulomatosis without renal involvement: Secondary | ICD-10-CM

## 2013-05-19 LAB — CBC WITH DIFFERENTIAL/PLATELET
Eosinophils Absolute: 0.1 10*3/uL (ref 0.0–0.5)
MONO#: 0.7 10*3/uL (ref 0.1–0.9)
NEUT#: 10.4 10*3/uL — ABNORMAL HIGH (ref 1.5–6.5)
Platelets: 316 10*3/uL (ref 145–400)
RBC: 4.49 10*6/uL (ref 3.70–5.45)
RDW: 13.5 % (ref 11.2–14.5)
WBC: 12.9 10*3/uL — ABNORMAL HIGH (ref 3.9–10.3)

## 2013-06-23 ENCOUNTER — Other Ambulatory Visit (HOSPITAL_BASED_OUTPATIENT_CLINIC_OR_DEPARTMENT_OTHER): Payer: Medicare Other | Admitting: Lab

## 2013-06-23 DIAGNOSIS — M3130 Wegener's granulomatosis without renal involvement: Secondary | ICD-10-CM

## 2013-06-23 LAB — CBC WITH DIFFERENTIAL/PLATELET
Eosinophils Absolute: 0 10*3/uL (ref 0.0–0.5)
HCT: 39.5 % (ref 34.8–46.6)
HGB: 13.2 g/dL (ref 11.6–15.9)
LYMPH%: 14 % (ref 14.0–49.7)
MONO#: 0.4 10*3/uL (ref 0.1–0.9)
NEUT#: 7.9 10*3/uL — ABNORMAL HIGH (ref 1.5–6.5)
NEUT%: 80.9 % — ABNORMAL HIGH (ref 38.4–76.8)
Platelets: 341 10*3/uL (ref 145–400)
WBC: 9.8 10*3/uL (ref 3.9–10.3)

## 2013-06-26 ENCOUNTER — Other Ambulatory Visit: Payer: Self-pay | Admitting: Oncology

## 2013-07-10 ENCOUNTER — Other Ambulatory Visit: Payer: Self-pay

## 2013-07-21 ENCOUNTER — Other Ambulatory Visit (HOSPITAL_BASED_OUTPATIENT_CLINIC_OR_DEPARTMENT_OTHER): Payer: Medicare Other | Admitting: Lab

## 2013-07-21 DIAGNOSIS — M3130 Wegener's granulomatosis without renal involvement: Secondary | ICD-10-CM

## 2013-07-21 LAB — CBC WITH DIFFERENTIAL/PLATELET
BASO%: 0.8 % (ref 0.0–2.0)
Basophils Absolute: 0.1 10*3/uL (ref 0.0–0.1)
EOS%: 1.2 % (ref 0.0–7.0)
HCT: 42.9 % (ref 34.8–46.6)
LYMPH%: 29 % (ref 14.0–49.7)
MCH: 29.6 pg (ref 25.1–34.0)
MCHC: 32.8 g/dL (ref 31.5–36.0)
MONO#: 0.8 10*3/uL (ref 0.1–0.9)
NEUT%: 59 % (ref 38.4–76.8)
Platelets: 317 10*3/uL (ref 145–400)

## 2013-08-05 ENCOUNTER — Encounter: Payer: Self-pay | Admitting: Nutrition

## 2013-08-05 NOTE — Progress Notes (Signed)
Patient called requesting nutrition consult for both her and her daughter for weight loss.  [facility] referred to [facility] for weight loss counseling.

## 2013-08-18 ENCOUNTER — Other Ambulatory Visit: Payer: Medicare Other

## 2013-09-01 ENCOUNTER — Telehealth: Payer: Self-pay | Admitting: *Deleted

## 2013-09-01 ENCOUNTER — Other Ambulatory Visit: Payer: Self-pay | Admitting: *Deleted

## 2013-09-01 ENCOUNTER — Other Ambulatory Visit (HOSPITAL_BASED_OUTPATIENT_CLINIC_OR_DEPARTMENT_OTHER): Payer: Medicare Other

## 2013-09-01 DIAGNOSIS — M3130 Wegener's granulomatosis without renal involvement: Secondary | ICD-10-CM

## 2013-09-01 LAB — CBC WITH DIFFERENTIAL/PLATELET
BASO%: 0.7 % (ref 0.0–2.0)
EOS%: 0.7 % (ref 0.0–7.0)
Eosinophils Absolute: 0.1 10*3/uL (ref 0.0–0.5)
LYMPH%: 10.4 % — ABNORMAL LOW (ref 14.0–49.7)
MCH: 30.1 pg (ref 25.1–34.0)
MCHC: 33.1 g/dL (ref 31.5–36.0)
MCV: 91.1 fL (ref 79.5–101.0)
MONO#: 0.7 10*3/uL (ref 0.1–0.9)
NEUT%: 82.6 % — ABNORMAL HIGH (ref 38.4–76.8)
RBC: 4.69 10*6/uL (ref 3.70–5.45)
RDW: 13.2 % (ref 11.2–14.5)
lymph#: 1.3 10*3/uL (ref 0.9–3.3)

## 2013-09-01 NOTE — Telephone Encounter (Signed)
Lm informed the pt that i was returning her call. i asked her to call me back so that we can get her schedule according to the day her kit arrives...td

## 2013-09-22 ENCOUNTER — Other Ambulatory Visit: Payer: Medicare Other

## 2013-10-01 ENCOUNTER — Telehealth: Payer: Self-pay | Admitting: *Deleted

## 2013-10-01 ENCOUNTER — Other Ambulatory Visit: Payer: Self-pay | Admitting: *Deleted

## 2013-10-01 ENCOUNTER — Telehealth: Payer: Self-pay | Admitting: Oncology

## 2013-10-01 DIAGNOSIS — M3130 Wegener's granulomatosis without renal involvement: Secondary | ICD-10-CM

## 2013-10-01 NOTE — Telephone Encounter (Signed)
Patient has received kit from [facility] clinic and needs to come in [place] to have labs draw, r/s from 09/22/2013.

## 2013-10-01 NOTE — Telephone Encounter (Signed)
lmonvm for pt re appt for 1/30 @ 10:45am. pt asked to call back to r/s if she cannot keep appt. other appts remain the same.

## 2013-10-02 ENCOUNTER — Other Ambulatory Visit (HOSPITAL_BASED_OUTPATIENT_CLINIC_OR_DEPARTMENT_OTHER): Payer: Medicare Other

## 2013-10-02 ENCOUNTER — Telehealth: Payer: Self-pay | Admitting: *Deleted

## 2013-10-02 DIAGNOSIS — M3130 Wegener's granulomatosis without renal involvement: Secondary | ICD-10-CM

## 2013-10-02 LAB — CBC WITH DIFFERENTIAL/PLATELET
BASO%: 1.2 % (ref 0.0–2.0)
Basophils Absolute: 0.1 10*3/uL (ref 0.0–0.1)
EOS%: 11.4 % — ABNORMAL HIGH (ref 0.0–7.0)
Eosinophils Absolute: 1.2 10*3/uL — ABNORMAL HIGH (ref 0.0–0.5)
HCT: 40.4 % (ref 34.8–46.6)
HGB: 13.5 g/dL (ref 11.6–15.9)
LYMPH%: 30.5 % (ref 14.0–49.7)
MCH: 30.2 pg (ref 25.1–34.0)
MCHC: 33.3 g/dL (ref 31.5–36.0)
MCV: 90.8 fL (ref 79.5–101.0)
MONO#: 1 10*3/uL — ABNORMAL HIGH (ref 0.1–0.9)
MONO%: 9.1 % (ref 0.0–14.0)
NEUT#: 5.1 10*3/uL (ref 1.5–6.5)
NEUT%: 47.8 % (ref 38.4–76.8)
Platelets: 331 10*3/uL (ref 145–400)
RBC: 4.46 10*6/uL (ref 3.70–5.45)
RDW: 13.2 % (ref 11.2–14.5)
WBC: 10.7 10*3/uL — AB (ref 3.9–10.3)
lymph#: 3.3 10*3/uL (ref 0.9–3.3)

## 2013-10-02 LAB — COMPREHENSIVE METABOLIC PANEL (CC13)
ALBUMIN: 3.7 g/dL (ref 3.5–5.0)
ALK PHOS: 54 U/L (ref 40–150)
ALT: 15 U/L (ref 0–55)
AST: 12 U/L (ref 5–34)
Anion Gap: 8 mEq/L (ref 3–11)
BUN: 17.9 mg/dL (ref 7.0–26.0)
CO2: 26 mEq/L (ref 22–29)
Calcium: 9.2 mg/dL (ref 8.4–10.4)
Chloride: 106 mEq/L (ref 98–109)
Creatinine: 0.8 mg/dL (ref 0.6–1.1)
GLUCOSE: 73 mg/dL (ref 70–140)
Potassium: 4.1 mEq/L (ref 3.5–5.1)
Sodium: 141 mEq/L (ref 136–145)
TOTAL PROTEIN: 6.4 g/dL (ref 6.4–8.3)
Total Bilirubin: 0.21 mg/dL (ref 0.20–1.20)

## 2013-10-02 NOTE — Telephone Encounter (Signed)
Per Amy M pt is aware of her arrival time for labs...td

## 2013-10-03 ENCOUNTER — Other Ambulatory Visit: Payer: Medicare Other

## 2013-10-20 ENCOUNTER — Other Ambulatory Visit (HOSPITAL_BASED_OUTPATIENT_CLINIC_OR_DEPARTMENT_OTHER): Payer: Medicare Other

## 2013-10-20 DIAGNOSIS — M3130 Wegener's granulomatosis without renal involvement: Secondary | ICD-10-CM

## 2013-10-20 LAB — CBC WITH DIFFERENTIAL/PLATELET
BASO%: 1.1 % (ref 0.0–2.0)
Basophils Absolute: 0.1 10*3/uL (ref 0.0–0.1)
EOS%: 2.6 % (ref 0.0–7.0)
Eosinophils Absolute: 0.2 10*3/uL (ref 0.0–0.5)
HCT: 42.9 % (ref 34.8–46.6)
HGB: 14.1 g/dL (ref 11.6–15.9)
LYMPH%: 30.3 % (ref 14.0–49.7)
MCH: 30 pg (ref 25.1–34.0)
MCHC: 32.9 g/dL (ref 31.5–36.0)
MCV: 91.4 fL (ref 79.5–101.0)
MONO#: 0.9 10*3/uL (ref 0.1–0.9)
MONO%: 9.6 % (ref 0.0–14.0)
NEUT#: 5.1 10*3/uL (ref 1.5–6.5)
NEUT%: 56.4 % (ref 38.4–76.8)
PLATELETS: 327 10*3/uL (ref 145–400)
RBC: 4.7 10*6/uL (ref 3.70–5.45)
RDW: 13.1 % (ref 11.2–14.5)
WBC: 9 10*3/uL (ref 3.9–10.3)
lymph#: 2.7 10*3/uL (ref 0.9–3.3)

## 2013-11-17 ENCOUNTER — Other Ambulatory Visit: Payer: Medicare Other

## 2013-11-17 ENCOUNTER — Ambulatory Visit (HOSPITAL_BASED_OUTPATIENT_CLINIC_OR_DEPARTMENT_OTHER): Payer: Medicare Other

## 2013-11-17 DIAGNOSIS — M3130 Wegener's granulomatosis without renal involvement: Secondary | ICD-10-CM

## 2013-11-17 LAB — CBC WITH DIFFERENTIAL/PLATELET
BASO%: 1 % (ref 0.0–2.0)
Basophils Absolute: 0.1 10*3/uL (ref 0.0–0.1)
EOS%: 3.4 % (ref 0.0–7.0)
Eosinophils Absolute: 0.2 10*3/uL (ref 0.0–0.5)
HCT: 42.1 % (ref 34.8–46.6)
HGB: 13.8 g/dL (ref 11.6–15.9)
LYMPH#: 1.9 10*3/uL (ref 0.9–3.3)
LYMPH%: 26.6 % (ref 14.0–49.7)
MCH: 29.8 pg (ref 25.1–34.0)
MCHC: 32.8 g/dL (ref 31.5–36.0)
MCV: 90.8 fL (ref 79.5–101.0)
MONO#: 0.7 10*3/uL (ref 0.1–0.9)
MONO%: 9.4 % (ref 0.0–14.0)
NEUT#: 4.3 10*3/uL (ref 1.5–6.5)
NEUT%: 59.6 % (ref 38.4–76.8)
Platelets: 303 10*3/uL (ref 145–400)
RBC: 4.64 10*6/uL (ref 3.70–5.45)
RDW: 13.2 % (ref 11.2–14.5)
WBC: 7.2 10*3/uL (ref 3.9–10.3)

## 2013-12-22 ENCOUNTER — Telehealth: Payer: Self-pay | Admitting: *Deleted

## 2013-12-22 ENCOUNTER — Other Ambulatory Visit: Payer: Medicare Other

## 2013-12-22 NOTE — Telephone Encounter (Signed)
Pt left message requesting appointment for today be cancelled due to " I have not received the kit from [facility] "  " I will contact them about the kit and get the appointment rescheduled "  Appointment cancelled per above.

## 2013-12-23 ENCOUNTER — Other Ambulatory Visit (HOSPITAL_BASED_OUTPATIENT_CLINIC_OR_DEPARTMENT_OTHER): Payer: Medicare Other

## 2013-12-23 ENCOUNTER — Telehealth: Payer: Self-pay | Admitting: Oncology

## 2013-12-23 DIAGNOSIS — M3130 Wegener's granulomatosis without renal involvement: Secondary | ICD-10-CM

## 2013-12-23 LAB — CBC WITH DIFFERENTIAL/PLATELET
BASO%: 0.5 % (ref 0.0–2.0)
Basophils Absolute: 0 10*3/uL (ref 0.0–0.1)
EOS%: 1.4 % (ref 0.0–7.0)
Eosinophils Absolute: 0.1 10*3/uL (ref 0.0–0.5)
HEMATOCRIT: 41.9 % (ref 34.8–46.6)
HEMOGLOBIN: 13.9 g/dL (ref 11.6–15.9)
LYMPH#: 2.7 10*3/uL (ref 0.9–3.3)
LYMPH%: 31.8 % (ref 14.0–49.7)
MCH: 29.8 pg (ref 25.1–34.0)
MCHC: 33.2 g/dL (ref 31.5–36.0)
MCV: 89.9 fL (ref 79.5–101.0)
MONO#: 0.9 10*3/uL (ref 0.1–0.9)
MONO%: 10.9 % (ref 0.0–14.0)
NEUT#: 4.6 10*3/uL (ref 1.5–6.5)
NEUT%: 55.4 % (ref 38.4–76.8)
Platelets: 317 10*3/uL (ref 145–400)
RBC: 4.66 10*6/uL (ref 3.70–5.45)
RDW: 13.2 % (ref 11.2–14.5)
WBC: 8.3 10*3/uL (ref 3.9–10.3)
nRBC: 0 % (ref 0–0)

## 2013-12-23 NOTE — Telephone Encounter (Signed)
pt called to r/s missed appt...done... °

## 2014-01-16 ENCOUNTER — Other Ambulatory Visit: Payer: Self-pay | Admitting: *Deleted

## 2014-01-16 DIAGNOSIS — M3130 Wegener's granulomatosis without renal involvement: Secondary | ICD-10-CM

## 2014-01-19 ENCOUNTER — Ambulatory Visit (HOSPITAL_BASED_OUTPATIENT_CLINIC_OR_DEPARTMENT_OTHER): Payer: Medicare Other | Admitting: Oncology

## 2014-01-19 ENCOUNTER — Telehealth: Payer: Self-pay | Admitting: Oncology

## 2014-01-19 ENCOUNTER — Other Ambulatory Visit (HOSPITAL_BASED_OUTPATIENT_CLINIC_OR_DEPARTMENT_OTHER): Payer: Medicare Other

## 2014-01-19 ENCOUNTER — Other Ambulatory Visit: Payer: Medicare Other

## 2014-01-19 VITALS — BP 118/69 | HR 57 | Temp 98.4°F | Resp 18 | Ht 67.0 in | Wt 160.6 lb

## 2014-01-19 DIAGNOSIS — M3130 Wegener's granulomatosis without renal involvement: Secondary | ICD-10-CM

## 2014-01-19 DIAGNOSIS — M899 Disorder of bone, unspecified: Secondary | ICD-10-CM

## 2014-01-19 DIAGNOSIS — M949 Disorder of cartilage, unspecified: Secondary | ICD-10-CM

## 2014-01-19 LAB — COMPREHENSIVE METABOLIC PANEL (CC13)
ALBUMIN: 3.6 g/dL (ref 3.5–5.0)
ALK PHOS: 45 U/L (ref 40–150)
ALT: 15 U/L (ref 0–55)
AST: 14 U/L (ref 5–34)
Anion Gap: 10 mEq/L (ref 3–11)
BILIRUBIN TOTAL: 0.29 mg/dL (ref 0.20–1.20)
BUN: 15.3 mg/dL (ref 7.0–26.0)
CO2: 24 mEq/L (ref 22–29)
Calcium: 9.1 mg/dL (ref 8.4–10.4)
Chloride: 107 mEq/L (ref 98–109)
Creatinine: 0.9 mg/dL (ref 0.6–1.1)
Glucose: 92 mg/dl (ref 70–140)
Potassium: 4.3 mEq/L (ref 3.5–5.1)
SODIUM: 140 meq/L (ref 136–145)
Total Protein: 6.3 g/dL — ABNORMAL LOW (ref 6.4–8.3)

## 2014-01-19 LAB — CBC WITH DIFFERENTIAL/PLATELET
BASO%: 1.1 % (ref 0.0–2.0)
Basophils Absolute: 0.1 10*3/uL (ref 0.0–0.1)
EOS%: 1.4 % (ref 0.0–7.0)
Eosinophils Absolute: 0.1 10*3/uL (ref 0.0–0.5)
HCT: 41 % (ref 34.8–46.6)
HGB: 13.2 g/dL (ref 11.6–15.9)
LYMPH%: 29.9 % (ref 14.0–49.7)
MCH: 29.3 pg (ref 25.1–34.0)
MCHC: 32.2 g/dL (ref 31.5–36.0)
MCV: 90.9 fL (ref 79.5–101.0)
MONO#: 0.9 10*3/uL (ref 0.1–0.9)
MONO%: 10.8 % (ref 0.0–14.0)
NEUT#: 4.8 10*3/uL (ref 1.5–6.5)
NEUT%: 56.8 % (ref 38.4–76.8)
Platelets: 322 10*3/uL (ref 145–400)
RBC: 4.51 10*6/uL (ref 3.70–5.45)
RDW: 13.4 % (ref 11.2–14.5)
WBC: 8.5 10*3/uL (ref 3.9–10.3)
lymph#: 2.5 10*3/uL (ref 0.9–3.3)

## 2014-01-19 NOTE — Progress Notes (Signed)
ID: Theresa [name]  DOB: [birth year]  MR#: [id number]  CSN#: [id number]  PCP: Nicola Girt GYN: SU: OTHER MD: [name] (fax [phone]); Jaquita Rector; Danton Sewer; Kandis Ban; [name] (fax [phone])  History of wegener's granulomatosis:  [name] tells me her diagnosis of Wegener was initially made at the [facility] by Dr. [name] in 1999.  She has been treated in the past with Cytoxan and prednisone, methotrexate very briefly, Cytoxan orally, then Imuran for some time, and then off treatment for some years.  She was started on prednisone alone in 2009, and then in 06/2008 the azathioprine was added again at 150 mg daily, and variable doses of prednisone.  It is felt that she failed remission maintenance therapy since she could not reduce her dose of prednisone below 20 mg daily without having [name] resurgence of symptoms, and therefore she has been started on Rituxan with the first dose given at the [facility] on 06/24.  The patient tolerated the treatment quite well.  Her subsequent history is as detailed below  PMHx: Associated with the Wegener, the past medical history is significant for prior tracheostomy, prior sinus surgery x 2, prior right tear duct surgery at [facility] for epiphora (with subsequent re-obstruction).  She also has [name] history of nasal saddling and inflammation left greater than right.  She also gets occasional thrush problems because of the steroid treatment.  Aside from the Wegener, she has [name] history of osteopenia, history of migraines, history of GERD, history of mitral valve prolapse, and [name] history of MRSA infection remotely (1999).  Interval History:   Theresa [name] returns for followup of for Wegener's. Since her last visit here, she has moved out of her house and now is in [name] condo in [place] with her daughter Theresa [name]. Her daughter will be [name] Paramedic in high school in the fall. The patient's son will be transferring to [employer] at the same time. His  Masters is in finance and well for management  ROS: She continues to have significant problems with insomnia and is still taking lorazepam 2 tablets at bedtime plus [name] half an Ambien. She has been under lorazepam for over 14 years now. She has some sinus issues, with minimal epistaxis at times. She feels [name] little hoarse. She has up for prednisone at times but is now back at 5 mg [name] day. She tolerates that well. She does get aches and pains here in there, but she does not want to increase the dose and of course you should not increase the dose without discussing it with Dr. Zenia Resides. She feels anxious and depressed sometimes. Overall she is doing remarkably well. She is of course on disability at this point. [name] detailed review of systems today was stable  Allergies  Allergen Reactions  . Codeine Itching  . Vancomycin Hives  . Tape Rash    Current Outpatient Prescriptions  Medication Sig Dispense Refill  . albuterol (PROVENTIL HFA;VENTOLIN HFA) 108 (90 BASE) MCG/ACT inhaler Inhale 2 puffs into the lungs every 6 (six) hours as needed. For shortness of breath or wheezing      . Calcium Carbonate-Vitamin D (CALCIUM 600+D) 600-400 MG-UNIT per tablet Take 1 tablet by mouth daily.      Marland Kitchen esomeprazole (NEXIUM) 40 MG capsule Take 40 mg by mouth daily before breakfast.      . fluticasone (FLONASE) 50 MCG/ACT nasal spray Place 2 sprays into the nose daily.      . fluticasone (FLOVENT HFA)  110 MCG/ACT inhaler Inhale 2 puffs into the lungs daily.      Marland Kitchen LORazepam (ATIVAN) 1 MG tablet Take 1 mg by mouth at bedtime as needed. For sleep      . Omega-3 Fatty Acids (FISH OIL PO) Take 1 capsule by mouth 3 (three) times daily.      . predniSONE (DELTASONE) 5 MG tablet Take 5 mg by mouth daily.      . ranitidine (ZANTAC) 150 MG capsule Take 150 mg by mouth 2 (two) times daily as needed. For acid reflux      . sulfamethoxazole-trimethoprim (BACTRIM,SEPTRA) 400-80 MG per tablet Take 1 tablet by mouth every morning.      .  Vitamin D, Ergocalciferol, (DRISDOL) 50000 UNITS CAPS Take 50,000 Units by mouth every 7 (seven) days. On Sundays       No current facility-administered medications for this visit.    FAMILY HISTORY:  The patient's father is alive in his early 54s.  The patient's mother is alive in her late 53s.  The patient has 3 sisters.  There is no one else in the family with autoimmune disease or with cancer.  GYNECOLOGIC HISTORY:  She is GX, P2, first pregnancy to term age 43.  Last menstrual period was June of 2012, and she had none for many months before that.  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  Her former husband, Theresa [name], works for the [employer] for [employer], where he is the [employer].  They have [name] son currently in college and [name] daughter at home with the patient.  They attend [employer] here in [place].  HEALTH MAINTENANCE:  She has never had [name] colonoscopy.  She is not [name] smoker, and never abused tobacco or alcohol.  She tells me her cholesterol is good.  She has [name] history of osteopenia. She is up to date on mammography (2013)  Objective: Middle-aged white woman in no acute distress  Filed Vitals:   01/19/14 1041  BP: 118/69  Pulse: 57  Temp: 98.4 F (36.9 C)  Resp: 18    BMI: Body mass index is 25.15 kg/(m^2).   ECOG FS: 2  Physical Exam:   Sclerae unicteric  Oropharynx clear  No cervical or supraclavicular adenopathy  Lungs no rales or rhonchi, no wheezes, fair excursion bilaterally  Heart regular rate and rhythm  Abdomen soft, nontender, positive bowel sounds  MSK no focal spinal tenderness, no peripheral edema  Neuro nonfocal, well oriented, appropriate affect  Breast exam: Deferred  Lab Results:      Chemistry      Component Value Date/Time   NA 141 10/02/2013 1037   NA 142 04/25/2012 1023   K 4.1 10/02/2013 1037   K 4.0 04/25/2012 1023   CL 106 11/07/2012 0916    CL 107 04/25/2012 1023   CO2 26 10/02/2013 1037   CO2 27 04/25/2012 1023   BUN 17.9 10/02/2013 1037   BUN 13 04/25/2012 1023   CREATININE 0.8 10/02/2013 1037   CREATININE 0.93 04/25/2012 1023      Component Value Date/Time   CALCIUM 9.2 10/02/2013 1037   CALCIUM 9.3 04/25/2012 1023   ALKPHOS 54 10/02/2013 1037   ALKPHOS 48 04/25/2012 1023   AST 12 10/02/2013 1037   AST 15 04/25/2012 1023   ALT 15 10/02/2013 1037   ALT 18 04/25/2012 1023   BILITOT  0.21 10/02/2013 1037   BILITOT 0.3 04/25/2012 1023       Lab Results  Component Value Date   WBC 8.5 01/19/2014   HGB 13.2 01/19/2014   HCT 41.0 01/19/2014   MCV 90.9 01/19/2014   PLT 322 01/19/2014   NEUTROABS 4.8 01/19/2014    Studies/Results:  No results found.  Assessment: 53 y.o.  [place] woman with [name] history of Wegener's granulomatosis initially diagnosed in 1999, variously treated as noted above, s/p Rituxan x4 completed July 2010, currently on low-dose prednisone maintenance.   Plan: Denyla is doing well as far as her Wegener's is concerned, but she is losing her treatment team. [facility] tells me [facility], and Dr. [name] is going to research. She feels that doctors at [facility] are not giving her the support she needs.  She definitely needs [name] pulmonologist and I am going to refer her to Dr. Chase Caller for his evaluation and continuing q. year. I see Tahliyah on [name] once [name] year basis chiefly to make it possible for her to receive rituximab whenever her primary rheumatologist/pulmonologist decides he would be advisable. Of course we also do lab work, which she tells me now may change to every 3 months. (She will call us with that information).  Kenesha does need to complete healthcare power of attorney documents and I gave her the appropriate forms to complete and notarize today.  She knows to call for any problems that may develop before her next visit here.  Virgie Dad Magrinat 01/19/2014

## 2014-01-19 NOTE — Telephone Encounter (Signed)
per pof to sch pt appt/pt to call Ramaswamy to make an  appt-gave pt copy of sch

## 2014-02-10 ENCOUNTER — Institutional Professional Consult (permissible substitution): Payer: Medicare Other | Admitting: Internal Medicine

## 2014-02-19 ENCOUNTER — Encounter: Payer: Self-pay | Admitting: Internal Medicine

## 2014-02-19 ENCOUNTER — Ambulatory Visit (INDEPENDENT_AMBULATORY_CARE_PROVIDER_SITE_OTHER): Payer: Medicare Other | Admitting: Internal Medicine

## 2014-02-19 VITALS — BP 112/64 | HR 67 | Ht 66.5 in | Wt 160.0 lb

## 2014-02-19 DIAGNOSIS — M3130 Wegener's granulomatosis without renal involvement: Secondary | ICD-10-CM

## 2014-02-19 NOTE — Patient Instructions (Signed)
#  Granulomatous Polyangitis (GPA) disease  - please give my information to [facility] aug 2015 - call for any issues  #Followup  - sept 2015

## 2014-02-19 NOTE — Progress Notes (Addendum)
Subjective:    Patient ID: Theresa [name], female    DOB: [birth year], 53 y.o.   MRN: [id number] PCP Doug Sou B Referred by DR Magrinat Dr [name] (fax [phone]); - [facility] - world expert in ANCA vasculitis Dr [name] (fax [phone]) - [facility]umatologist   HPI   IOV 02/19/2014   Chief Complaint  Patient presents with  . 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       53 year old female. 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  CAre direction is from Dr [name] at [facility] in [place], [place]. Main reason she is here she wants local pulmonologist back up  Reports that in  1998 when pregnant with daughter she developed severe headaches and sinusitis. She recollects mulitple inteventions and Rx prior sinus surgery x 2, prior right tear duct surgery at [facility] for epiphora (with subsequent re-obstruction). She also has a history of nasal saddling and inflammation left greater than right.  And biopsies with no diagnosis. In 1999 had upper airway obstruction and underwent emergency trach at [facility] (Dr [name]). Other interventions include:  Resuled in visit to [facility] and Dr [name] making diagnosis of GPA.   She has been treated in the past with Cytoxan and prednisone, methotrexate very briefly, Cytoxan orally, then Imuran for some time, and then off treatment for some years. She was started on prednisone alone in 2009, and then in 06/2008 the azathioprine was added again at 150 mg daily, and variable doses of prednisone. Then in end 2013, early 2013 had relapse and needed rituxan and was felt  She could not go below prednisone 20mg  per day. She had first dose at [facility] and subsequent 3 weekly  doses with [name] early 2013.  Aside from the Wegener, she has a history of osteopenia, history of migraines, history of GERD, history of  mitral valve prolapse, and a history of MRSA infection remotely (1999).   In 2014: but it appers disease in stable/remission with prednisone and in 2014: she was able to get disability. In May 2015 She followed with [name], disease was felt to be stable. HEr main issues were fatigue, insomnia , anxiety, and mild anxiety. SHe has clarified that overall she has handled lif stressors well and does not suffer from anxiety disorder per se. IF atll anxiety is mild and only situational  She is now referred because she wants a local Rx team. She feels her [facility] eNT team is retiring. And apparently Dr [name] at [facility] is moving towards research + expensive to fly up there. So far Rx decisions have come from [facility] and not from [place] rheum. She is making a trip up there in August 2015 and will see if she can continue going there but she will likely look for increasing Rx decisions from [place].  From pulm perspective, she prefers [facility] for admissions and prefers pulmonary admit her if she became critically ill. She wants airway support in [place]; we discussed local ENT docs. For now, she wants to hold off establishing someone as OPD. She is aware that if critical, ER and PCCM will coordinate that.  She also wants me to be her resouruce locally. NOTED 07/22/2014 - she clarfied that [facility] (not [facility] or [facility]) will be here resource for upper airway emergency; she has new team there  I have agreed to her  care plan   Notes updated with edits 07/22/2014 - patient pointed some inaccurracies     Smoking hx  reports that she has never smoked. She has never used smokeless tobacco.   CXR Oct 2012   RADIOLOGY REPORT*  Clinical Data: Cough for 5 days. Wegener's granulomatosis.  CHEST - 2 VIEW  Comparison: None.  Findings: Emphysema is present with flattening of hemidiaphragms  and enlargement retrosternal clear space. Bronchitic changes are  present in the lower lobes bilaterally. Cardiopericardial  silhouette and  mediastinal contours appear within normal limits.  Vertebral body height is preserved. No destructive osseous lesions  are identified. No plain film evidence of adenopathy.  IMPRESSION:  Hyperinflation consist with emphysema. No active cardiopulmonary  disease.  Original Report Authenticated By: Dereck Ligas, M.D.   Past Medical History  Diagnosis Date  . Wegener's granulomatosis      Family History  Problem Relation Age of Onset  . Asthma Daughter   . Asthma Sister   . CAD Father   . Skin cancer Father   . Breast cancer Sister   . Hypertension Mother      History   Social History  . Marital Status: Single    Spouse Name: N/A    Number of Children: N/A  . Years of Education: N/A   Occupational History  . unemployeed    Social History Main Topics  . Smoking status: Never Smoker   . Smokeless tobacco: Never Used  . Alcohol Use: No  . Drug Use: No  . Sexual Activity: No   Other Topics Concern  . Not on file   Social History Narrative  . No narrative on file     Allergies  Allergen Reactions  . Codeine Itching  . Vancomycin Hives  . Tape Rash     Outpatient Prescriptions Prior to Visit  Medication Sig Dispense Refill  . albuterol (PROVENTIL HFA;VENTOLIN HFA) 108 (90 BASE) MCG/ACT inhaler Inhale 2 puffs into the lungs every 6 (six) hours as needed. For shortness of breath or wheezing      . Calcium Carbonate-Vitamin D (CALCIUM 600+D) 600-400 MG-UNIT per tablet Take 1 tablet by mouth daily.      Marland Kitchen esomeprazole (NEXIUM) 40 MG capsule Take 40 mg by mouth daily before breakfast.      . fluticasone (FLONASE) 50 MCG/ACT nasal spray Place 2 sprays into the nose daily.      . fluticasone (FLOVENT HFA) 110 MCG/ACT inhaler Inhale 2 puffs into the lungs daily.      Marland Kitchen LORazepam (ATIVAN) 1 MG tablet Take 1 mg by mouth at bedtime as needed. For sleep      . Omega-3 Fatty Acids (FISH OIL PO) Take 1 capsule by mouth 3 (three) times daily.      . predniSONE (DELTASONE) 5  MG tablet Take 5 mg by mouth daily.      . ranitidine (ZANTAC) 150 MG capsule Take 150 mg by mouth 2 (two) times daily as needed. For acid reflux      . sulfamethoxazole-trimethoprim (BACTRIM,SEPTRA) 400-80 MG per tablet Take 2 tablets by mouth every morning.       . Vitamin D, Ergocalciferol, (DRISDOL) 50000 UNITS CAPS Take 50,000 Units by mouth every 7 (seven) days. On Sundays       No facility-administered medications prior to visit.       Review of Systems  Constitutional: Negative for fever and unexpected weight change.  HENT: Positive for congestion, nosebleeds, rhinorrhea and sinus pressure. Negative for  dental problem, ear pain, postnasal drip, sneezing, sore throat and trouble swallowing.   Eyes: Negative for redness and itching.  Respiratory: Positive for shortness of breath. Negative for cough, chest tightness and wheezing.   Cardiovascular: Positive for palpitations. Negative for leg swelling.  Gastrointestinal: Negative for nausea and vomiting.  Genitourinary: Negative for dysuria.  Musculoskeletal: Positive for joint swelling.  Skin: Negative for rash.  Neurological: Positive for headaches.  Hematological: Does not bruise/bleed easily.  Psychiatric/Behavioral: Negative for dysphoric mood. The patient is not nervous/anxious.        Objective:   Physical Exam  Vitals reviewed. Constitutional: She is oriented to person, place, and time. She appears well-developed and well-nourished. No distress.  HENT:  Head: Normocephalic and atraumatic.  Right Ear: External ear normal.  Left Ear: External ear normal.  Mouth/Throat: Oropharynx is clear and moist. No oropharyngeal exudate.  Scar of tracheostomy Hoarse voice Slight deformity of nose  Eyes: Conjunctivae and EOM are normal. Pupils are equal, round, and reactive to light. Right eye exhibits no discharge. Left eye exhibits no discharge. No scleral icterus.  Neck: Normal range of motion. Neck supple. No JVD present. No  tracheal deviation present. No thyromegaly present.  Cardiovascular: Normal rate, regular rhythm, normal heart sounds and intact distal pulses.  Exam reveals no gallop and no friction rub.   No murmur heard. Pulmonary/Chest: Effort normal and breath sounds normal. No respiratory distress. She has no wheezes. She has no rales. She exhibits no tenderness.  Abdominal: Soft. Bowel sounds are normal. She exhibits no distension and no mass. There is no tenderness. There is no rebound and no guarding.  Musculoskeletal: Normal range of motion. She exhibits no edema and no tenderness.  Lymphadenopathy:    She has no cervical adenopathy.  Neurological: She is alert and oriented to person, place, and time. She has normal reflexes. No cranial nerve deficit. She exhibits normal muscle tone. Coordination normal.  Skin: Skin is warm and dry. No rash noted. She is not diaphoretic. No erythema. No pallor.  Psychiatric: She has a normal mood and affect. Her behavior is normal. Judgment and thought content normal.    Filed Vitals:   02/19/14 1540  BP: 112/64  Pulse: 67  Height: 5' 6.5" (1.689 m)  Weight: 160 lb (72.576 kg)  SpO2: 98%          Assessment & Plan:

## 2014-02-20 ENCOUNTER — Institutional Professional Consult (permissible substitution): Payer: Medicare Other | Admitting: Internal Medicine

## 2014-02-20 NOTE — Assessment & Plan Note (Signed)
Appears pulmonary status stable. She mainly has hx of ENT and upper airway involvement. PCCM will offer support. Will wait to hear from [facility] after her visit there in aug 2015 as to how much of a primary decision making role they will play. I can certainly step up my involvement in decision making depending on their visit. Explained that in [place], I wil rely on multi-modal decision making and sometimes might need referral to rheum. She is ok with this plan. For now hold off on ENT but at somepoint get her to [facility] ENT  > 50% of this > 25 min visit spent in face to face counseling

## 2014-03-09 ENCOUNTER — Ambulatory Visit: Payer: Medicare Other

## 2014-05-27 ENCOUNTER — Telehealth: Payer: Self-pay | Admitting: Internal Medicine

## 2014-05-27 NOTE — Telephone Encounter (Signed)
lmomtcb x1 

## 2014-05-27 NOTE — Telephone Encounter (Signed)
Ok thanks. Please have her sign release to get [facility] records from august 2015 visit  Thanks  Dr. Brand Males, M.D., [facility].C.P Pulmonary and Critical Care Medicine Staff Physician [place] [facility] Pager: [phone], If no answer or between  15:00h - 7:00h: call [phone]  05/27/2014 10:38 AM

## 2014-05-27 NOTE — Telephone Encounter (Signed)
Called spoke with pt. She reports she is going to see PCP now. C/o feeling pressure on the top of her head. She is concerned since she has Wegeners. She is going to see what her PCP recommends today and go from there. She wanted to make MR aware.

## 2014-05-28 NOTE — Telephone Encounter (Signed)
Called pt. She will call to get records and if not she will sign release at her OV 06/11/14. Nothing further needed

## 2014-05-28 NOTE — Telephone Encounter (Signed)
Patient returning call. [phone]

## 2014-05-28 NOTE — Telephone Encounter (Signed)
lmtcb x2 

## 2014-06-11 ENCOUNTER — Telehealth: Payer: Self-pay | Admitting: *Deleted

## 2014-06-11 ENCOUNTER — Ambulatory Visit: Payer: Medicare Other | Admitting: Internal Medicine

## 2014-06-11 NOTE — Telephone Encounter (Signed)
This RN spoke with pt per her call wanting advice of Dr Jannifer Rodney-  " I am in the process of obtaining new primary because it seems several of my known doctors have retired "  " per my last visit with Dr Jannifer Rodney we discussed me stopping the ativan- which he said is difficult to do - and I would need to taper slowly "  Since that visit Theresa [name] has tapered from 2mg  to 0.25mg  - " but I am having a really hard time especially at night "  Theresa [name] states she feels anxious- has ruminating thoughts- and unable to sleep.  She states new primary MD prescribed Buspar " but I am reluctant to exchange one medication for the other ".  Theresa [name] tried benadryl " but that hypes me up ".  She did use melatonin last night and stated " it kinda calmed me down but I didn't sleep"  She was also prescribed Ambien 12.5 XR by primary MD " but I am scared to take it after reading about it ".  Per 15 minute phone conversation this RN addressed above issues as well as noted anxiety which may indicate serotonin depletion- and need for replacement with a medication like Buspar.  Theresa [name] asked " if obsessive thoughts when trying to focus on one thing is from tapering off the ativan ".  This RN validated pt's overall situation, and discussed above may be indicative of  need for serotonin replacement. Per conversation reviewed reasons serotonin could be decreased as being life stressors (divorced- single parent ) as well as disease process and side effects of therapy.  Overall Theresa [name] would like to be off the ativan- and ideally not be on antianxiety medication,but per discussion she stated " I may try the Buspar again and see if helps "  Pt would like Dr Gerarda Fraction opinion on above and if needed see him for discussion.

## 2014-06-18 ENCOUNTER — Encounter: Payer: Self-pay | Admitting: Internal Medicine

## 2014-06-18 ENCOUNTER — Telehealth: Payer: Self-pay | Admitting: Internal Medicine

## 2014-06-18 ENCOUNTER — Ambulatory Visit (INDEPENDENT_AMBULATORY_CARE_PROVIDER_SITE_OTHER): Payer: Medicare Other | Admitting: Internal Medicine

## 2014-06-18 VITALS — BP 130/82 | HR 56 | Ht 67.0 in | Wt 160.2 lb

## 2014-06-18 DIAGNOSIS — M3130 Wegener's granulomatosis without renal involvement: Secondary | ICD-10-CM

## 2014-06-18 NOTE — Telephone Encounter (Signed)
Theresa [name]  Please send my notes to Dr [name] at [facility]  Dr. Brand Males, M.D., [facility].C.P Pulmonary and Critical Care Medicine Staff Physician [place] [facility] Pager: [phone], If no answer or between  15:00h - 7:00h: call [phone]  06/18/2014 11:00 AM

## 2014-06-18 NOTE — Patient Instructions (Addendum)
#  Granulomatous Polyangitis (GPA) disease  - Disease seems under remission based on clinical evaluation - Await [facility] evaluation Nov 2015 for labs and disease workupo  - Prevnar vaccine 06/18/2014 - I can be reached by [employer] on [email or web address] and [phone] byt the [place] doctors   #FOllowup  3-6 months at your convenience  Please call or come sooner if any complaints or concerns

## 2014-06-18 NOTE — Telephone Encounter (Signed)
Called and spoke to Dr. Claire Shown nurse.  Fax: [phone] Phone: [phone]  OV note from 06/18/14 faxed to Dr. Mariane Baumgarten. Nothing further needed.

## 2014-06-18 NOTE — Progress Notes (Addendum)
Subjective:    Patient ID: Theresa [name], female    DOB: 1961/06/03, 53 y.o.   MRN: [id number]  HPI  PCP Doug Sou B Referred by DR Magrinat Dr [name] (fax [phone]); - [facility] - world expert in ANCA vasculitis Dr [name] (fax [phone]) - [facility] ENT  - Dr [name] and DR [name] at [facility]   HPI   [place] 02/19/2014   Chief Complaint  Patient presents with  . 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      53 year old female. 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  CAre direction is from Dr [name] at [facility] in [place], [place]. Main reason she is here she wants local pulmonologist back up  Reports that in  1998 when pregnant with daughter she developed severe headaches and sinusitis. She recollects mulitple inteventions and Rx prior sinus surgery x 2, prior right tear duct surgery at [facility] for epiphora (with subsequent re-obstruction). She also has a history of nasal saddling and inflammation left greater than right.  And biopsies with no diagnosis. In 1999 had upper airway obstruction and underwent emergency trach at [facility] (Dr [name]). Other interventions include:  Resuled in visit to [facility] and Dr [name] making diagnosis of GPA.   She has been treated in the past with Cytoxan and prednisone, methotrexate very briefly, Cytoxan orally, then Imuran for some time, and then off treatment for some years. She was started on prednisone alone in 2009, and then in 06/2008 the azathioprine was added again at 150 mg daily, and variable doses of prednisone. Then in end 2013, early 2013 had relapse and needed rituxan and was felt  She could not go below prednisone 20mg  per day. She had first dose at [facility] and subsequent 3 weekly  doses with [name] early 2013.  Aside from the Wegener, she has a history of osteopenia,  history of migraines, history of GERD, history of mitral valve prolapse, and a history of MRSA infection remotely (1999).   In 2014: but it appers disease in stable/remission with prednisone and in 2014: she was able to get disability. In May 2015 She followed with [name], disease was felt to be stable. HEr main issues were fatigue, insomnia , anxiety, and mild anxiety. SHe has clarified that overall she has handled lif stressors well and does not suffer from anxiety disorder per se. IF atll anxiety is mild and only situational  She is now referred because she wants a local Rx team. She feels her [facility] eNT team is retiring. And apparently Dr [name] at [facility] is moving towards research + expensive to fly up there. So far Rx decisions have come from [facility] and not from [place] rheum. She is making a trip up there in August 2015 and will see if she can continue going there but she will likely look for increasing Rx decisions from [place].  From pulm perspective, she prefers [facility] for admissions and prefers pulmonary admit her if she became critically ill. She wants airway support in [place]; we discussed local ENT docs. For now, she wants to hold off establishing someone as OPD. She is aware that if critical, ER and PCCM will coordinate that.  She also wants me to be her resouruce locally  I have agreed to her care plan     Smoking hx  reports that she  has never smoked. She has never used smokeless tobacco.   CXR Oct 2012   RADIOLOGY REPORT*  Clinical Data: Cough for 5 days. Wegener's granulomatosis.  CHEST - 2 VIEW  Comparison: None.  Findings: Emphysema is present with flattening of hemidiaphragms  and enlargement retrosternal clear space. Bronchitic changes are  present in the lower lobes bilaterally. Cardiopericardial  silhouette and mediastinal contours appear within normal limits.  Vertebral body height is preserved. No destructive osseous lesions  are identified. No plain film evidence of  adenopathy.  IMPRESSION:  Hyperinflation consist with emphysema. No active cardiopulmonary  disease.  Original Report Authenticated By: Dereck Ligas, M.D.    [place] 06/18/2014  Chief Complaint  Patient presents with  . Follow-up    Pt state since last OV pt states she is feeling better. Pt states she is going to [facility] in November. Pt denies SOB, cough and CP/tightness.     Followup GPA   - She has postponed trip to [facility] due to father and father in law being sick with cancer. She did have soe headache, MRI Was normal per hx and headaches attributed to ativan weaning (she is tyrin to get herself off ativan which she has been on for decades). Shee feels wegner under remission. No hematuria or respiratory issues. Will defer labs to [facility] visit. Recent PR-3 aparently up a point. She has not had PREVNAR and will have it 06/18/2014   She did clarify to me that plan in case of airway emergency is NOT for [place] as indicated in visit June 2015 but [place] where she has new team (pripr docs have retired)     Past: She is trying to wean herself off ativan   Immunization History  Administered Date(s) Administered  . Hep B / HiB 11/02/2013  . Influenza Split 06/04/2013, 05/19/2014    Current outpatient prescriptions:albuterol (PROVENTIL HFA;VENTOLIN HFA) 108 (90 BASE) MCG/ACT inhaler, Inhale 2 puffs into the lungs every 6 (six) hours as needed. For shortness of breath or wheezing, Disp: , Rfl: ;  busPIRone (BUSPAR) 7.5 MG tablet, Take 7.5 mg by mouth at bedtime., Disp: , Rfl: ;  Calcium Carbonate-Vitamin D (CALCIUM 600+D) 600-400 MG-UNIT per tablet, Take 1 tablet by mouth daily., Disp: , Rfl:  esomeprazole (NEXIUM) 40 MG capsule, Take 40 mg by mouth daily before breakfast., Disp: , Rfl: ;  fluticasone (FLONASE) 50 MCG/ACT nasal spray, Place 2 sprays into the nose daily., Disp: , Rfl: ;  fluticasone (FLOVENT HFA) 110 MCG/ACT inhaler, Inhale 2 puffs into the lungs daily., Disp: ,  Rfl: ;  LORazepam (ATIVAN) 1 MG tablet, Take 0.25 mg by mouth at bedtime as needed. For sleep, Disp: , Rfl:  Omega-3 Fatty Acids (FISH OIL PO), Take 1 capsule by mouth 3 (three) times daily., Disp: , Rfl: ;  predniSONE (DELTASONE) 5 MG tablet, Take 5 mg by mouth daily., Disp: , Rfl: ;  ranitidine (ZANTAC) 150 MG capsule, Take 150 mg by mouth 2 (two) times daily as needed. For acid reflux, Disp: , Rfl: ;  sulfamethoxazole-trimethoprim (BACTRIM,SEPTRA) 400-80 MG per tablet, Take 2 tablets by mouth every morning. , Disp: , Rfl:    Review of Systems  Constitutional: Negative for fever and unexpected weight change.  HENT: Negative for congestion, dental problem, ear pain, nosebleeds, postnasal drip, rhinorrhea, sinus pressure, sneezing, sore throat and trouble swallowing.   Eyes: Negative for redness and itching.  Respiratory: Negative for cough, chest tightness, shortness of breath and wheezing.   Cardiovascular: Negative  for palpitations and leg swelling.  Gastrointestinal: Negative for nausea and vomiting.  Genitourinary: Negative for dysuria.  Musculoskeletal: Negative for joint swelling.  Skin: Negative for rash.  Neurological: Negative for headaches.  Hematological: Does not bruise/bleed easily.  Psychiatric/Behavioral: Negative for dysphoric mood. The patient is not nervous/anxious.        Objective:   Physical Exam  Filed Vitals:   06/18/14 1025  BP: 130/82  Pulse: 56  Height: 5\' 7"  (1.702 m)  Weight: 160 lb 3.2 oz (72.666 kg)  SpO2: 98%   itals reviewed. Constitutional: She is oriented to person, place, and time. She appears well-developed and well-nourished. No distress.  HENT:  Head: Normocephalic and atraumatic.  Right Ear: External ear normal.  Left Ear: External ear normal.  Mouth/Throat: Oropharynx is clear and moist. No oropharyngeal exudate.  Scar of tracheostomy Hoarse voice Slight deformity of nose  Eyes: Conjunctivae and EOM are normal. Pupils are equal, round,  and reactive to light. Right eye exhibits no discharge. Left eye exhibits no discharge. No scleral icterus.  Neck: Normal range of motion. Neck supple. No JVD present. No tracheal deviation present. No thyromegaly present.  Cardiovascular: Normal rate, regular rhythm, normal heart sounds and intact distal pulses.  Exam reveals no gallop and no friction rub.   No murmur heard. Pulmonary/Chest: Effort normal and breath sounds normal. No respiratory distress. She has no wheezes. She has no rales. She exhibits no tenderness.  Abdominal: Soft. Bowel sounds are normal. She exhibits no distension and no mass. There is no tenderness. There is no rebound and no guarding.  Musculoskeletal: Normal range of motion. She exhibits no edema and no tenderness.  Lymphadenopathy:    She has no cervical adenopathy.  Neurological: She is alert and oriented to person, place, and time. She has normal reflexes. No cranial nerve deficit. She exhibits normal muscle tone. Coordination normal.  Skin: Skin is warm and dry. No rash noted. She is not diaphoretic. No erythema. No pallor.  Psychiatric: She has a normal mood and affect. Her behavior is normal. Judgment and thought content normal.        Assessment & Plan:  #Granulomatous Polyangitis (GPA) disease  - Disease seems under remission based on clinical evaluation - [facility]wait [facility] evaluation Nov 2015 for labs and disease workupo  - Prevnar vaccine 06/18/2014 - I can be reached by [employer] on [email or web address] and [phone] byt the [place] doctors   #FOllowup  3-6 months at your convenience  Please call or come sooner if any complaints or concerns   Dr. Brand Males, M.D., [facility].C.P Pulmonary and Critical Care Medicine Staff Physician [place] [facility] Pager: [phone], If no answer or between  15:00h - 7:00h: call [phone]  06/18/2014 10:59 AM

## 2014-07-06 ENCOUNTER — Ambulatory Visit (INDEPENDENT_AMBULATORY_CARE_PROVIDER_SITE_OTHER): Payer: Medicare Other | Admitting: Internal Medicine

## 2014-07-06 ENCOUNTER — Encounter: Payer: Self-pay | Admitting: Internal Medicine

## 2014-07-06 VITALS — BP 110/75 | HR 77 | Temp 98.3°F | Resp 16 | Ht 64.75 in | Wt 163.0 lb

## 2014-07-06 DIAGNOSIS — K219 Gastro-esophageal reflux disease without esophagitis: Secondary | ICD-10-CM

## 2014-07-06 DIAGNOSIS — F411 Generalized anxiety disorder: Secondary | ICD-10-CM

## 2014-07-06 DIAGNOSIS — M3130 Wegener's granulomatosis without renal involvement: Secondary | ICD-10-CM

## 2014-07-06 DIAGNOSIS — G43909 Migraine, unspecified, not intractable, without status migrainosus: Secondary | ICD-10-CM

## 2014-07-06 DIAGNOSIS — G4700 Insomnia, unspecified: Secondary | ICD-10-CM

## 2014-07-06 DIAGNOSIS — M810 Age-related osteoporosis without current pathological fracture: Secondary | ICD-10-CM

## 2014-07-06 DIAGNOSIS — M159 Polyosteoarthritis, unspecified: Secondary | ICD-10-CM

## 2014-07-06 DIAGNOSIS — M8949 Other hypertrophic osteoarthropathy, multiple sites: Secondary | ICD-10-CM

## 2014-07-06 DIAGNOSIS — Z803 Family history of malignant neoplasm of breast: Secondary | ICD-10-CM

## 2014-07-06 DIAGNOSIS — Z8614 Personal history of Methicillin resistant Staphylococcus aureus infection: Secondary | ICD-10-CM

## 2014-07-06 DIAGNOSIS — R61 Generalized hyperhidrosis: Secondary | ICD-10-CM

## 2014-07-06 DIAGNOSIS — G43809 Other migraine, not intractable, without status migrainosus: Secondary | ICD-10-CM

## 2014-07-06 DIAGNOSIS — M150 Primary generalized (osteo)arthritis: Secondary | ICD-10-CM

## 2014-07-06 HISTORY — DX: Insomnia, unspecified: G47.00

## 2014-07-06 HISTORY — DX: Migraine, unspecified, not intractable, without status migrainosus: G43.909

## 2014-07-06 HISTORY — DX: Generalized anxiety disorder: F41.1

## 2014-07-06 LAB — LIPID PANEL
CHOL/HDL RATIO: 2.8 ratio
Cholesterol: 212 mg/dL — ABNORMAL HIGH (ref 0–200)
HDL: 75 mg/dL (ref >39)
LDL CALC: 108 mg/dL — AB (ref 0–99)
TRIGLYCERIDES: 145 mg/dL (ref <150)
VLDL: 29 mg/dL (ref 0–40)

## 2014-07-06 NOTE — Patient Instructions (Signed)
Schedule CPE

## 2014-07-06 NOTE — Progress Notes (Signed)
Subjective:    Patient ID: Theresa [name], female    DOB: [birth year], 53 y.o.   MRN: [id number]  HPI New pt here for primary care first visit  10/15/pulmonary note 53 year old female. 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 CAre direction is from Dr [name] at [facility] in [place], [place]. Main reason she is here she wants local pulmonologist back up  Reports that in approx 1996 when pregnant with daughter she developed severe headaches and sinusitis. She recollects mulitple inteventions and Rx prior sinus surgery x 2, prior right tear duct surgery at [facility] for epiphora (with subsequent re-obstruction). She also has a history of nasal saddling and inflammation left greater than right. And biopsies with no diagnosis. In 1999 had upper airway obstruction and underwent emergency trach at [facility] (Dr [name]). Other interventions include: Resuled in visit to [facility] and Dr [name] making diagnosis of GPA.   She has been treated in the past with Cytoxan and prednisone, methotrexate very briefly, Cytoxan orally, then Imuran for some time, and then off treatment for some years. She was started on prednisone alone in 2009, and then in 06/2008 the azathioprine was added again at 150 mg daily, and variable doses of prednisone. Then in end 2013, early 2013 had relapse and needed rituxan and was felt She could not go below prednisone 20mg  per day. She had first dose at [facility] and subsequent 3 weekly doses with [name] early 2013.  Aside from the Wegener, she has a history of osteopenia, history of migraines, history of GERD, history of mitral valve prolapse, and a history of MRSA infection remotely (1999).   In 2013: she dealt with significant social stressors (divorce, home foreclosure) but it appers disease in stable/remission with prednisone and in 2014: she was able to get disability. In May 2015 She  followed with [name], disease was felt to be stable. HEr main issues were fatigue, insomnia , anxiety, and mild anxiety  She is now referred because she wants a local Rx team. She feels her [facility] eNT team is retiring. And apparently Dr [name] at [facility] is moving towards research + expensive to fly up there. So far Rx decisions have come from [facility] and not from [place] rheum. She is making a trip up there in August 2015 and will see if she can continue going there but she will likely look for increasing Rx decisions from [place]. From pulm perspective, she prefers [facility] for admissions and prefers pulmonary admit her if she became critically ill. She wants airway support in [place]; we discussed local ENT docs. For now, she wants to hold off establishing someone as OPD. She is aware that if critical, ER and PCCM will coordinate that. She also wants me to be her resouruce locally  I have agreed to her care plan  #Granulomatous Polyangitis (GPA) disease - Disease seems under remission based on clinical evaluation - [facility]wait [facility] evaluation Nov 2015 for labs and disease workupo - Prevnar vaccine 06/18/2014 - I can be reached by [employer] on [email or web address] and [phone] byt the [place] doctors   #FOllowup 3-6 months at your convenience Please call or come sooner if any complaints or concerns   Dr. Brand Males, M.D., [facility].C.P Pulmonary and Critical Care Medicine Staff Physician [place] [facility] Pager: [phone], If no answer or between 15:00h - 7:00h: call  [phone]  06/18/2014  Todays note: Theresa [name] is a new pt here for primary care first visit:   See  above Wegener's complicated by sinus, tear duct involvement and multiple espisode of airway obstruction.   PMH of osteoporosis on REclast, h/o MRSA(on Septra for suppresion) GERD,  Anxiety, MVP and migraine headache Fh sister with Breast cancer.  Has been stable recently  Has  chronic insomnia but has managed to get off her Ativan which she has used for years.  Now takes occasional Ambien and melatonin  Now on disabilty,  Divorce finalized and emotionally some better.   She received flu and prevnar this year    Review of Systems See HPI    Objective:   Physical Exam Physical Exam  Nursing note and vitals reviewed.  Constitutional: She is oriented to person, place, and time. She appears well-developed and well-nourished.  HENT:  Head: Normocephalic and atraumatic.  Cardiovascular: Normal rate and regular rhythm. Exam reveals no gallop and no friction rub.  No murmur heard.  Pulmonary/Chest: Breath sounds normal. She has no wheezes. She has no rales.  Neurological: She is alert and oriented to person, place, and time.  Skin: Skin is warm and dry.  Psychiatric: She has a normal mood and affect. Her behavior is normal.              Assessment & Plan:  GERD  Continue Nexium  Osteoporosis on REclast per Dr. Davonna Belling.  I do not have current DeXA  GPA  Managed by pulmonolongist.   She receives Rituxan by oncology  Anxiety Maralyn Sago  Off BZP now using occasional ambien and melatonin  Migraine headache   H/o MRSA on septra suppression  Fh breast ca in sister.   Has received influenza vaccine and Prevnar 13 this year   Schedule CPE with me

## 2014-07-07 ENCOUNTER — Encounter: Payer: Self-pay | Admitting: *Deleted

## 2014-07-07 DIAGNOSIS — M1990 Unspecified osteoarthritis, unspecified site: Secondary | ICD-10-CM | POA: Insufficient documentation

## 2014-07-07 DIAGNOSIS — Z803 Family history of malignant neoplasm of breast: Secondary | ICD-10-CM | POA: Insufficient documentation

## 2014-07-07 DIAGNOSIS — K219 Gastro-esophageal reflux disease without esophagitis: Secondary | ICD-10-CM | POA: Insufficient documentation

## 2014-07-07 HISTORY — DX: Unspecified osteoarthritis, unspecified site: M19.90

## 2014-07-07 HISTORY — DX: Family history of malignant neoplasm of breast: Z80.3

## 2014-07-07 LAB — FOLLICLE STIMULATING HORMONE: FSH: 25.8 m[IU]/mL

## 2014-07-08 ENCOUNTER — Encounter: Payer: Self-pay | Admitting: Internal Medicine

## 2014-07-08 ENCOUNTER — Other Ambulatory Visit: Payer: Self-pay | Admitting: *Deleted

## 2014-07-08 MED ORDER — ZOLPIDEM TARTRATE 10 MG PO TABS: ORAL_TABLET | ORAL | Status: DC

## 2014-07-08 NOTE — Telephone Encounter (Signed)
I spoke with Theresa [name] and she is aware that we called in #15 of the Ambien. She was advise to only take it about 3 times a week so that she does not get dependant on it.-eh

## 2014-07-08 NOTE — Telephone Encounter (Signed)
Call pt and let her know that I am givning her 15 pills of her Ambien.    She should take 1/2 tab 3-4 times a week not every night as she will be dependent on Ambien

## 2014-07-08 NOTE — Telephone Encounter (Signed)
Refill request

## 2014-07-15 ENCOUNTER — Encounter: Payer: Self-pay | Admitting: Internal Medicine

## 2014-07-15 ENCOUNTER — Encounter: Payer: Self-pay | Admitting: Oncology

## 2014-07-15 NOTE — Telephone Encounter (Signed)
MR, I am forwarding these emails to you so you are aware. I do not see where this information is documented like this in the patient chart. If it is in the OV note I cannot change that. If there is nothing to be done you can close this message. [place], CMA

## 2014-07-22 NOTE — Telephone Encounter (Signed)
11.18.15 mychart message from pt: Message     Dear Dr. Chase Caller,    Thank you so much for your response. I had a few remaining comments and questions. The oct 2012 notes read my chest X-ray showed emphysema. I did not know that. Is this something chronic or because I was sick at that time?        The clinic notes from our last visit states I have said I am in remission. I don't believe I said that because this is a phrase I rarely say since GPA has caused some chronic conditions. I never feel as if I am in remission because of the chronic fatigue and sinus issues, hoarseness, coughing up phlegm, etc...everything written in previous notes. I am not sure what translates into "remission", but I always defer to my physicians for making that determination.     In an emergent situation I have only been accustomed to receiving care from [facility]. That said, maybe you and I can discuss changing physicians at some point to the [facility] network.     Thank you for understanding my desire for privacy. I will try to acclimate to using the mychart portal.         Best,LMO   ~~~~~~~~~~~~~~~~~~~ Message forwarded to MR to make him aware of pt's response Email sent to patient thanking her for her attentiveness and informing her that her additional questions/concerns are being forwarded to MR to address further

## 2014-08-03 ENCOUNTER — Telehealth: Payer: Self-pay | Admitting: *Deleted

## 2014-08-03 DIAGNOSIS — I8390 Asymptomatic varicose veins of unspecified lower extremity: Secondary | ICD-10-CM

## 2014-08-03 NOTE — Telephone Encounter (Signed)
Theresa [name] called and wanted to know who you recommended her see for verucose veins.-eh

## 2014-08-04 NOTE — Telephone Encounter (Signed)
Spoke with [name] and let her know that Dr. Donnetta Hutching will be contacting her. -eh

## 2014-08-11 ENCOUNTER — Telehealth: Payer: Self-pay | Admitting: Emergency Medicine

## 2014-08-11 NOTE — Telephone Encounter (Signed)
Patient called requesting a referral to a physician who specializes in treatment for varicose veins. Patient states she saw someone today at the "[facility]", but he is not a vascular surgeon. Patient states she would like [name] to recommend someone whom he trusts.

## 2014-08-13 NOTE — Telephone Encounter (Signed)
Called pt and left her a voicemail stating that Dr. [name] recommends both Dr. Sherren Mocha Early or Dr. Ruta Hinds and their phone number to make an appointment is [phone]. Told pt that if she is unable to get an appt with them for any reason to please call us back and we can make a referral per Dr. [name].

## 2014-08-17 ENCOUNTER — Ambulatory Visit (INDEPENDENT_AMBULATORY_CARE_PROVIDER_SITE_OTHER): Payer: Medicare Other | Admitting: Internal Medicine

## 2014-08-17 ENCOUNTER — Encounter: Payer: Self-pay | Admitting: Internal Medicine

## 2014-08-17 VITALS — BP 115/78 | HR 80 | Resp 16 | Ht 66.0 in | Wt 157.0 lb

## 2014-08-17 DIAGNOSIS — R002 Palpitations: Secondary | ICD-10-CM

## 2014-08-17 NOTE — Progress Notes (Signed)
   Subjective:    Patient ID: Theresa [name], female    DOB: 1961-04-22, 53 y.o.   MRN: [id number]  HPI 07/2014 note GERD Continue Nexium  Osteoporosis on REclast per Dr. Davonna Belling. I do not have current DeXA  GPA Managed by pulmonolongist. She receives Rituxan by oncology  Anxiety Maralyn Sago Off BZP now using occasional ambien and melatonin  Migraine headache   H/o MRSA on septra suppression  Fh breast ca in sister.   Has received influenza vaccine and Prevnar 13 this year   Schedule CPE with me     TODAY:  Theresa [name] is here to discuss palpitations and night time sweating   Palpitations  Occurs usually at night.  No dizziness no syncope  Had holter done 2013 which revealed no concerning arrythmia  - few PACS and few PVCs  She has cut back her coffee to 1 cup a day   Night sweats she believes this may be vasomotor flushing  LMP 2012  No sweating in the day.  Sister has breast cancer    Review of Systems    see HPI Objective:   Physical Exam Physical Exam  Nursing note and vitals reviewed.  Constitutional: She is oriented to person, place, and time. She appears well-developed and well-nourished.  HENT:  Head: Normocephalic and atraumatic.  Cardiovascular: Normal rate and regular rhythm. Exam reveals no gallop and no friction rub.  No murmur heard.  Pulmonary/Chest: Breath sounds normal. She has no wheezes. She has no rales.  Neurological: She is alert and oriented to person, place, and time.  Skin: Skin is warm and dry.  Psychiatric: She has a normal mood and affect. Her behavior is normal.              Assessment & Plan:  Palpitations    EKG today   SR no acute changes  .  Cut back on caffiene advised if any fainting to go to ER  .  If any worsening she is to see me in office  Will check CBC, TSH,  chemistries  Night sweats  Educated of risk/benefit of HT  She will try OTC  genistein or black cohosh with cooling pillow   See me if any  worsening

## 2014-08-17 NOTE — Patient Instructions (Signed)
See me if any worsening

## 2014-08-18 LAB — COMPREHENSIVE METABOLIC PANEL
ALBUMIN: 4 g/dL (ref 3.5–5.2)
ALT: 17 U/L (ref 0–35)
AST: 18 U/L (ref 0–37)
Alkaline Phosphatase: 42 U/L (ref 39–117)
BUN: 14 mg/dL (ref 6–23)
CO2: 25 mEq/L (ref 19–32)
Calcium: 9.1 mg/dL (ref 8.4–10.5)
Chloride: 106 mEq/L (ref 96–112)
Creat: 0.92 mg/dL (ref 0.50–1.10)
GLUCOSE: 109 mg/dL — AB (ref 70–99)
Potassium: 4.5 mEq/L (ref 3.5–5.3)
Sodium: 140 mEq/L (ref 135–145)
Total Bilirubin: 0.4 mg/dL (ref 0.2–1.2)
Total Protein: 6.1 g/dL (ref 6.0–8.3)

## 2014-08-18 LAB — CBC WITH DIFFERENTIAL/PLATELET
Basophils Absolute: 0.1 10*3/uL (ref 0.0–0.1)
Basophils Relative: 1 % (ref 0–1)
Eosinophils Absolute: 0 10*3/uL (ref 0.0–0.7)
Eosinophils Relative: 0 % (ref 0–5)
HCT: 40.2 % (ref 36.0–46.0)
Hemoglobin: 13.2 g/dL (ref 12.0–15.0)
Lymphocytes Relative: 19 % (ref 12–46)
Lymphs Abs: 1.5 10*3/uL (ref 0.7–4.0)
MCH: 29.5 pg (ref 26.0–34.0)
MCHC: 32.8 g/dL (ref 30.0–36.0)
MCV: 89.7 fL (ref 78.0–100.0)
MONOS PCT: 5 % (ref 3–12)
MPV: 9.3 fL — ABNORMAL LOW (ref 9.4–12.4)
Monocytes Absolute: 0.4 10*3/uL (ref 0.1–1.0)
NEUTROS PCT: 75 % (ref 43–77)
Neutro Abs: 5.9 10*3/uL (ref 1.7–7.7)
Platelets: 361 10*3/uL (ref 150–400)
RBC: 4.48 MIL/uL (ref 3.87–5.11)
RDW: 13.2 % (ref 11.5–15.5)
WBC: 7.8 10*3/uL (ref 4.0–10.5)

## 2014-08-19 LAB — TSH: TSH: 0.735 u[IU]/mL (ref 0.350–4.500)

## 2014-09-16 ENCOUNTER — Other Ambulatory Visit: Payer: Self-pay | Admitting: *Deleted

## 2014-09-16 ENCOUNTER — Ambulatory Visit: Payer: Medicare Other | Admitting: Internal Medicine

## 2014-09-16 DIAGNOSIS — I8390 Asymptomatic varicose veins of unspecified lower extremity: Secondary | ICD-10-CM

## 2014-09-17 ENCOUNTER — Encounter: Payer: Self-pay | Admitting: *Deleted

## 2014-09-30 ENCOUNTER — Telehealth: Payer: Self-pay

## 2014-09-30 NOTE — Telephone Encounter (Signed)
Ms. Henchy Mccauley called requesting a lab appointment in early [place] for labs to be sent to [facility].  She has orders/tubes and box to have them Fed-Ex to [facility]. Told her that the appointment was on Tuesday Feb 9 at 0830.  If she has any questions or needs to change the appointment she can call back to the [facility]. Appointment needs to be before 2 pm as Fed Ex foes out about #pm per Mariann Laster in [facility] lab.

## 2014-10-05 ENCOUNTER — Encounter: Payer: Self-pay | Admitting: Vascular Surgery

## 2014-10-06 ENCOUNTER — Ambulatory Visit (INDEPENDENT_AMBULATORY_CARE_PROVIDER_SITE_OTHER): Payer: Medicare Other | Admitting: Vascular Surgery

## 2014-10-06 ENCOUNTER — Encounter: Payer: Self-pay | Admitting: Vascular Surgery

## 2014-10-06 VITALS — BP 103/67 | HR 76 | Resp 18 | Ht 65.5 in | Wt 158.1 lb

## 2014-10-06 DIAGNOSIS — I83899 Varicose veins of unspecified lower extremities with other complications: Secondary | ICD-10-CM | POA: Insufficient documentation

## 2014-10-06 DIAGNOSIS — I83893 Varicose veins of bilateral lower extremities with other complications: Secondary | ICD-10-CM

## 2014-10-06 HISTORY — DX: Varicose veins of unspecified lower extremity with other complications: I83.899

## 2014-10-06 NOTE — Progress Notes (Signed)
Patient name: Theresa [name] MRN: [id number] DOB: 1960-12-04 Sex: female   Referred by: Magrinat  Reason for referral:  Chief Complaint  Patient presents with  . New Evaluation    c/o heaviness and achiness bilateral LE  (L=R)  swelling (L>R)     . Varicose Veins    HISTORY OF PRESENT ILLNESS: History is today for discussion of severe bilateral venous hypertension. She is a 54 year old female with a significant past history of Wegener's granulomatosis. She has had multiple severe or events associated with this including severe pulmonary disease. She is followed at [facility]. She was seen recently. She does have discussion for many years regarding treatment of her severe venous hypertension and was told by her physicians at the [facility] that it would be safe to proceed. She has a bilateral discomfort. She is very large varicosities on the posterior calves and thighs bilaterally. These calls her severe discomfort with prolonged standing. She has been extremely compliant for years with her graduated compression but reports that she is having increased difficulty and pain associated with this. She was seen in an outlying vein clinic and I have this evaluation formal venous duplex from December of this year. I have reviewed this with the patient well.  Past Medical History  Diagnosis Date  . Wegener's granulomatosis   . Anxiety   . Arthritis   . GERD (gastroesophageal reflux disease)   . Headaches, cluster   . Osteoporosis   . Mitral valve prolapse     Past Surgical History  Procedure Laterality Date  . Tracheostomy closure    . Nasal sinus surgery    . Eye surgery    . Airway foreign body removal    . Mitral valve prolaspe    . Tear duct surgery      History   Social History  . Marital Status: Single    Spouse Name: N/A    Number of Children: N/A  . Years of Education: N/A   Occupational History  . unemployeed    Social History Main Topics  . Smoking  status: Never Smoker   . Smokeless tobacco: Never Used  . Alcohol Use: No  . Drug Use: No  . Sexual Activity:    Partners: Male   Other Topics Concern  . Not on file   Social History Narrative    Family History  Problem Relation Age of Onset  . Asthma Daughter   . Asthma Sister   . Cancer Sister   . Breast cancer Sister   . CAD Father   . Skin cancer Father   . Heart disease Father   . Osteoporosis Father   . COPD Father   . Prostate cancer Father   . Breast cancer Sister   . Hypertension Mother   . Osteoporosis Mother     Allergies as of 10/06/2014 - Review Complete 10/06/2014  Allergen Reaction Noted  . Codeine Itching 01/06/2012  . Eletriptan Nausea Only 08/17/2014  . Vancomycin Hives 01/06/2012  . Tape Rash 01/06/2012    Current Outpatient Prescriptions on File Prior to Visit  Medication Sig Dispense Refill  . albuterol (PROVENTIL HFA;VENTOLIN HFA) 108 (90 BASE) MCG/ACT inhaler Inhale 2 puffs into the lungs every 6 (six) hours as needed. For shortness of breath or wheezing    . Calcium Carbonate-Vitamin D (CALCIUM 600+D) 600-400 MG-UNIT per tablet Take 1 tablet by mouth daily.    Marland Kitchen esomeprazole (NEXIUM) 40 MG capsule Take 40 mg by mouth  daily before breakfast.    . fluticasone (FLONASE) 50 MCG/ACT nasal spray Place 2 sprays into the nose daily.    . fluticasone (FLOVENT HFA) 110 MCG/ACT inhaler Inhale 2 puffs into the lungs daily.    . Melatonin 5 MG TABS Take by mouth.    . Multiple Vitamin (MULTIVITAMIN) tablet Take 1 tablet by mouth daily.    . predniSONE (DELTASONE) 5 MG tablet Take 5 mg by mouth daily.    Marland Kitchen sulfamethoxazole-trimethoprim (BACTRIM DS,SEPTRA DS) 800-160 MG per tablet Take 1 tablet by mouth once.    Marland Kitchen zolpidem (AMBIEN) 10 MG tablet Take 1/2 tablet 3 times a week prn sleep 15 tablet 1  . Omega-3 Fatty Acids (FISH OIL PO) Take 1 capsule by mouth 3 (three) times daily.     No current facility-administered medications on file prior to visit.      REVIEW OF SYSTEMS:  Positives indicated with an "X"  CARDIOVASCULAR:  [ ]  chest pain   [ ]  chest pressure   [ ]  palpitations   [ ]  orthopnea   [ ]  dyspnea on exertion   [ ]  claudication   [ ]  rest pain   [ ]  DVT   [ ]  phlebitis PULMONARY:   [ ]  productive cough   [ ]  asthma   [ ]  wheezing NEUROLOGIC:   [ ]  weakness  [ ]  paresthesias  [ ]  aphasia  [ ]  amaurosis  [ ]  dizziness HEMATOLOGIC:   [ ]  bleeding problems   [ ]  clotting disorders MUSCULOSKELETAL:  [ ]  joint pain   [ ]  joint swelling GASTROINTESTINAL: [ ]   blood in stool  [ ]   hematemesis GENITOURINARY:  [ ]   dysuria  [ ]   hematuria PSYCHIATRIC:  [ ]  history of major depression INTEGUMENTARY:  [ ]  rashes  [ ]  ulcers CONSTITUTIONAL:  [ ]  fever   [ ]  chills  PHYSICAL EXAMINATION:  General: The patient is a well-nourished female, in no acute distress. Vital signs are BP 103/67 mmHg  Pulse 76  Resp 18  Ht 5' 5.5" (1.664 m)  Wt 158 lb 1.6 oz (71.714 kg)  BMI 25.90 kg/m2 Pulmonary: There is a good air exchange   Musculoskeletal: There are no major deformities.  There is no significant extremity pain. Neurologic: No focal weakness or paresthesias are detected, Skin: There are no ulcer or rashes noted. Psychiatric: The patient has normal affect. Cardiovascular: 2+ dorsalis pedis pulses bilaterally Prominent large tributary branches of her saphenous vein bilaterally.     Vascular Lab Studies: Reviewed from outlying vein Center. Does show gross reflux with enlarged great saphenous vein bilaterally  I reviewed Mr. Her veins with SonoSite ultrasound. This does show enlarged great saphenous vein throughout her thigh. She is very large branches extending off these and notify feeding into these large tributary varicosities  Impression and Plan:  Failed conservative treatment of venous hypertension associated with saphenous vein reflux. Have recommended staged bilateral laser ablation of great saphenous vein and stab  phlebectomy of tributary were possibly for symptom relief. Discussed the procedure including slight risk of DVT associated with the procedure. She wished to proceed as soon as possible. She is on steroids and I do not feel this is necessary to have a steroid boost related to this level of treatment. Will confirm this with Dr.Magrint.      Keniesha Adderly [facility] Office: [phone]

## 2014-10-07 ENCOUNTER — Ambulatory Visit (HOSPITAL_BASED_OUTPATIENT_CLINIC_OR_DEPARTMENT_OTHER)
Admission: RE | Admit: 2014-10-07 | Discharge: 2014-10-07 | Disposition: A | Discharge status: Home / Self Care | Payer: Medicare Other | Source: Ambulatory Visit | Attending: Family Medicine | Admitting: Family Medicine

## 2014-10-07 ENCOUNTER — Ambulatory Visit (INDEPENDENT_AMBULATORY_CARE_PROVIDER_SITE_OTHER): Payer: Medicare Other | Admitting: Family Medicine

## 2014-10-07 ENCOUNTER — Encounter: Payer: Self-pay | Admitting: Family Medicine

## 2014-10-07 VITALS — BP 117/79 | HR 67 | Ht 66.0 in | Wt 155.0 lb

## 2014-10-07 DIAGNOSIS — Y939 Activity, unspecified: Secondary | ICD-10-CM | POA: Diagnosis not present

## 2014-10-07 DIAGNOSIS — S92352A Displaced fracture of fifth metatarsal bone, left foot, initial encounter for closed fracture: Secondary | ICD-10-CM | POA: Diagnosis not present

## 2014-10-07 DIAGNOSIS — S99922A Unspecified injury of left foot, initial encounter: Secondary | ICD-10-CM

## 2014-10-07 DIAGNOSIS — M25572 Pain in left ankle and joints of left foot: Secondary | ICD-10-CM | POA: Diagnosis present

## 2014-10-07 MED ORDER — HYDROCODONE-ACETAMINOPHEN 5-325 MG PO TABS: 1.0000 | ORAL_TABLET | Freq: Four times a day (QID) | ORAL | Status: DC | PRN

## 2014-10-07 MED ORDER — OXYCODONE-ACETAMINOPHEN 5-325 MG PO TABS: 1.0000 | ORAL_TABLET | Freq: Four times a day (QID) | ORAL | Status: DC | PRN

## 2014-10-07 NOTE — Patient Instructions (Signed)
You sprained your ankle but also have a Jones fracture. Wear the short cam boot when up and about. Use crutches and do not put any weight on this foot. Icing as needed. Ok to take the boot off to ice and wash the area. Advil as you have been. Norco as needed for severe pain. Follow up with me in 3 weeks for reevaluation, repeat x-rays.

## 2014-10-08 DIAGNOSIS — S99922A Unspecified injury of left foot, initial encounter: Secondary | ICD-10-CM | POA: Insufficient documentation

## 2014-10-08 NOTE — Progress Notes (Signed)
PCP: SCHOENHOFF,DEBBIE, MD  Subjective:   HPI: Patient is a 54 y.o. female here for left ankle/foot injury.  Patient reports she first injured her left foot and ankle on 12/18 when she inverted this. A lot of pain and swelling laterally. Went to an orthopedic urgent care and had x-rays - told they looked ok but she could have a hairline fracture. 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 Never completely improved and reinjured foot same way 2-3 weeks later. Tried advil. Still swelling some. No prior injuries to these.  Past Medical History  Diagnosis Date  . Wegener's granulomatosis   . Anxiety   . Arthritis   . GERD (gastroesophageal reflux disease)   . Headaches, cluster   . Osteoporosis   . Mitral valve prolapse     Current Outpatient Prescriptions on File Prior to Visit  Medication Sig Dispense Refill  . albuterol (PROVENTIL HFA;VENTOLIN HFA) 108 (90 BASE) MCG/ACT inhaler Inhale 2 puffs into the lungs every 6 (six) hours as needed. For shortness of breath or wheezing    . Calcium Carbonate-Vitamin D (CALCIUM 600+D) 600-400 MG-UNIT per tablet Take 1 tablet by mouth daily.    Marland Kitchen esomeprazole (NEXIUM) 40 MG capsule Take 40 mg by mouth daily before breakfast.    . fluticasone (FLONASE) 50 MCG/ACT nasal spray Place 2 sprays into the nose daily.    . fluticasone (FLOVENT HFA) 110 MCG/ACT inhaler Inhale 2 puffs into the lungs daily.    . Melatonin 5 MG TABS Take by mouth.    . Multiple Vitamin (MULTIVITAMIN) tablet Take 1 tablet by mouth daily.    . Omega-3 Fatty Acids (FISH OIL PO) Take 1 capsule by mouth 3 (three) times daily.    . predniSONE (DELTASONE) 5 MG tablet Take 5 mg by mouth daily.    Marland Kitchen sulfamethoxazole-trimethoprim (BACTRIM DS,SEPTRA DS) 800-160 MG per tablet Take 1 tablet by mouth once.    Marland Kitchen zolpidem (AMBIEN) 10 MG tablet Take 1/2 tablet 3 times a week prn sleep 15 tablet 1   No current facility-administered medications on file prior to visit.    Past  Surgical History  Procedure Laterality Date  . Tracheostomy closure    . Nasal sinus surgery    . Eye surgery    . Airway foreign body removal    . Mitral valve prolaspe    . Tear duct surgery      Allergies  Allergen Reactions  . Codeine Itching  . Eletriptan Nausea Only  . Vancomycin Hives  . Tape Rash    History   Social History  . Marital Status: Single    Spouse Name: N/A    Number of Children: N/A  . Years of Education: N/A   Occupational History  . unemployeed    Social History Main Topics  . Smoking status: Never Smoker   . Smokeless tobacco: Never Used  . Alcohol Use: No  . Drug Use: No  . Sexual Activity:    Partners: Male   Other Topics Concern  . Not on file   Social History Narrative    Family History  Problem Relation Age of Onset  . Asthma Daughter   . Asthma Sister   . Cancer Sister   . Breast cancer Sister   . CAD Father   . Skin cancer Father   . Heart disease Father   . Osteoporosis Father   . COPD Father   . Prostate cancer Father   . Breast cancer Sister   .  Hypertension Mother   . Osteoporosis Mother     BP 117/79 mmHg  Pulse 67  Ht 5\' 6"  (1.676 m)  Wt 155 lb (70.308 kg)  BMI 25.03 kg/m2  Review of Systems: See HPI above.    Objective:  Physical Exam:  Gen: NAD  Left foot/ankle: Mild swelling, no bruising lateral foot.  No other deformity. FROM with pain on external rotation. TTP base 5th metatarsal.  Less tenderness ATFL.  No other tenderness of foot or ankle. Negative ant drawer, 1+ talar tilt.   Negative syndesmotic compression. Thompsons test negative. NV intact distally.    Assessment & Plan:  1. Left foot/ankle injury - believe she did sprain her ankle but radiographs and ultrasound today confirm she suffered a Jones fracture of her 5th metatarsal.  No callus formation seen and unfortunately she has been ambulatory on this.  Discussed options and will try conservative treatment - Wear her cam boot for 6  weeks with no weight bearing - stressed importance of this.  Icing, advil, norco as needed.  F/u in 3 weeks - repeat x-rays at that time.

## 2014-10-08 NOTE — Assessment & Plan Note (Signed)
believe she did sprain her ankle but radiographs and ultrasound today confirm she suffered a Jones fracture of her 5th metatarsal.  No callus formation seen and unfortunately she has been ambulatory on this.  Discussed options and will try conservative treatment - Wear her cam boot for 6 weeks with no weight bearing - stressed importance of this.  Icing, advil, norco as needed.  F/u in 3 weeks - repeat x-rays at that time.

## 2014-10-12 ENCOUNTER — Ambulatory Visit (INDEPENDENT_AMBULATORY_CARE_PROVIDER_SITE_OTHER): Payer: Medicare Other | Admitting: Internal Medicine

## 2014-10-12 ENCOUNTER — Encounter: Payer: Self-pay | Admitting: Internal Medicine

## 2014-10-12 VITALS — BP 112/77 | HR 78 | Resp 16 | Ht 66.0 in | Wt 156.0 lb

## 2014-10-12 DIAGNOSIS — M3130 Wegener's granulomatosis without renal involvement: Secondary | ICD-10-CM

## 2014-10-12 DIAGNOSIS — H9313 Tinnitus, bilateral: Secondary | ICD-10-CM

## 2014-10-12 NOTE — Progress Notes (Signed)
Subjective:    Patient ID: Theresa [name], female    DOB: 1961/04/10, 54 y.o.   MRN: [id number]  HPI Acute visit '   Seen at [facility] for tinnitus on 2/5  Was given Augmentin .  Continues with same symptoms.  She is wondering if it is the [facility]  She has known wegener's  She has appt with ENT at [facility] in 2 days   Allergies  Allergen Reactions  . Codeine Itching  . Eletriptan Nausea Only  . Vancomycin Hives  . Tape Rash   Past Medical History  Diagnosis Date  . Wegener's granulomatosis   . Anxiety   . Arthritis   . GERD (gastroesophageal reflux disease)   . Headaches, cluster   . Osteoporosis   . Mitral valve prolapse    Past Surgical History  Procedure Laterality Date  . Tracheostomy closure    . Nasal sinus surgery    . Eye surgery    . Airway foreign body removal    . Mitral valve prolaspe    . Tear duct surgery     History   Social History  . Marital Status: Single    Spouse Name: N/A    Number of Children: N/A  . Years of Education: N/A   Occupational History  . unemployeed    Social History Main Topics  . Smoking status: Never Smoker   . Smokeless tobacco: Never Used  . Alcohol Use: No  . Drug Use: No  . Sexual Activity:    Partners: Male   Other Topics Concern  . Not on file   Social History Narrative   Family History  Problem Relation Age of Onset  . Asthma Daughter   . Asthma Sister   . Cancer Sister   . Breast cancer Sister   . CAD Father   . Skin cancer Father   . Heart disease Father   . Osteoporosis Father   . COPD Father   . Prostate cancer Father   . Breast cancer Sister   . Hypertension Mother   . Osteoporosis Mother    Patient Active Problem List   Diagnosis Date Noted  . Injury of left foot 10/08/2014  . Varicose veins of lower extremities with complications 69/67/8938  . GERD (gastroesophageal reflux disease) 07/07/2014  . DJD (degenerative joint disease) 07/07/2014  . Family history of breast cancer in sister  07/07/2014  . Osteoporosis  on Reclast per Dr. Davonna Belling 07/06/2014  . Anxiety state 07/06/2014  . Insomnia  off BZP now  07/06/2014  . Migraine headache 07/06/2014  . Hx MRSA infection 07/06/2014  . Granulomatosis with polyangiitis (Wegener's) 03/24/2009   Current Outpatient Prescriptions on File Prior to Visit  Medication Sig Dispense Refill  . albuterol (PROVENTIL HFA;VENTOLIN HFA) 108 (90 BASE) MCG/ACT inhaler Inhale 2 puffs into the lungs every 6 (six) hours as needed. For shortness of breath or wheezing    . Calcium Carbonate-Vitamin D (CALCIUM 600+D) 600-400 MG-UNIT per tablet Take 1 tablet by mouth daily.    Marland Kitchen esomeprazole (NEXIUM) 40 MG capsule Take 40 mg by mouth daily before breakfast.    . fluticasone (FLONASE) 50 MCG/ACT nasal spray Place 2 sprays into the nose daily.    . fluticasone (FLOVENT HFA) 110 MCG/ACT inhaler Inhale 2 puffs into the lungs daily.    . Melatonin 5 MG TABS Take by mouth.    . Multiple Vitamin (MULTIVITAMIN) tablet Take 1 tablet by mouth daily.    . Omega-3 Fatty Acids (  FISH OIL PO) Take 1 capsule by mouth 3 (three) times daily.    . predniSONE (DELTASONE) 5 MG tablet Take 5 mg by mouth daily.    Marland Kitchen sulfamethoxazole-trimethoprim (BACTRIM DS,SEPTRA DS) 800-160 MG per tablet Take 1 tablet by mouth once.    Marland Kitchen zolpidem (AMBIEN) 10 MG tablet Take 1/2 tablet 3 times a week prn sleep 15 tablet 1  . oxyCODONE-acetaminophen (PERCOCET/ROXICET) 5-325 MG per tablet Take 1 tablet by mouth every 6 (six) hours as needed for severe pain. (Patient not taking: Reported on 10/12/2014) 60 tablet 0   No current facility-administered medications on file prior to visit.       Review of Systems See HPI    Objective:   Physical Exam  Physical Exam  Nursing note and vitals reviewed.  Constitutional: She is oriented to person, place, and time. She appears well-developed and well-nourished.  HENT:  Head: Normocephalic and atraumatic.  TM's bilateral minimal serous  effusion Cardiovascular: Normal rate and regular rhythm. Exam reveals no gallop and no friction rub.  No murmur heard.  Pulmonary/Chest: Breath sounds normal. She has no wheezes. She has no rales.  Neurological: She is alert and oriented to person, place, and time.  Skin: Skin is warm and dry.  Psychiatric: She has a normal mood and affect. Her behavior is normal.             Assessment & Plan:  Tinnitus  Keep appt with ENT  Etiology unclear at this point

## 2014-10-13 ENCOUNTER — Other Ambulatory Visit (HOSPITAL_BASED_OUTPATIENT_CLINIC_OR_DEPARTMENT_OTHER): Payer: Medicare Other

## 2014-10-13 ENCOUNTER — Other Ambulatory Visit: Payer: Self-pay | Admitting: *Deleted

## 2014-10-13 ENCOUNTER — Telehealth: Payer: Self-pay | Admitting: Family Medicine

## 2014-10-13 DIAGNOSIS — M3130 Wegener's granulomatosis without renal involvement: Secondary | ICD-10-CM

## 2014-10-13 LAB — CBC WITH DIFFERENTIAL/PLATELET
BASO%: 0.7 % (ref 0.0–2.0)
Basophils Absolute: 0.1 10*3/uL (ref 0.0–0.1)
EOS ABS: 0.2 10*3/uL (ref 0.0–0.5)
EOS%: 2.1 % (ref 0.0–7.0)
HCT: 42.2 % (ref 34.8–46.6)
HGB: 13.8 g/dL (ref 11.6–15.9)
LYMPH#: 2.8 10*3/uL (ref 0.9–3.3)
LYMPH%: 33.8 % (ref 14.0–49.7)
MCH: 30 pg (ref 25.1–34.0)
MCHC: 32.7 g/dL (ref 31.5–36.0)
MCV: 91.7 fL (ref 79.5–101.0)
MONO#: 0.8 10*3/uL (ref 0.1–0.9)
MONO%: 9.4 % (ref 0.0–14.0)
NEUT%: 54 % (ref 38.4–76.8)
NEUTROS ABS: 4.4 10*3/uL (ref 1.5–6.5)
PLATELETS: 315 10*3/uL (ref 145–400)
RBC: 4.6 10*6/uL (ref 3.70–5.45)
RDW: 13 % (ref 11.2–14.5)
WBC: 8.2 10*3/uL (ref 3.9–10.3)

## 2014-10-13 LAB — COMPREHENSIVE METABOLIC PANEL (CC13)
ALT: 20 U/L (ref 0–55)
ANION GAP: 9 meq/L (ref 3–11)
AST: 16 U/L (ref 5–34)
Albumin: 3.7 g/dL (ref 3.5–5.0)
Alkaline Phosphatase: 49 U/L (ref 40–150)
BUN: 13.7 mg/dL (ref 7.0–26.0)
CALCIUM: 9.3 mg/dL (ref 8.4–10.4)
CHLORIDE: 107 meq/L (ref 98–109)
CO2: 26 mEq/L (ref 22–29)
Creatinine: 0.8 mg/dL (ref 0.6–1.1)
EGFR: 81 mL/min/{1.73_m2} — AB (ref >90)
Glucose: 82 mg/dl (ref 70–140)
POTASSIUM: 4.2 meq/L (ref 3.5–5.1)
Sodium: 142 mEq/L (ref 136–145)
TOTAL PROTEIN: 6.3 g/dL — AB (ref 6.4–8.3)
Total Bilirubin: 0.34 mg/dL (ref 0.20–1.20)

## 2014-10-13 NOTE — Telephone Encounter (Signed)
Spoke to patient and suggested she call [place] on [facility] about knee scooter rental. The patient was given the phone number for [place].

## 2014-10-15 ENCOUNTER — Telehealth: Payer: Self-pay | Admitting: *Deleted

## 2014-10-15 ENCOUNTER — Encounter: Payer: Self-pay | Admitting: Vascular Surgery

## 2014-10-15 ENCOUNTER — Encounter (HOSPITAL_COMMUNITY): Payer: Self-pay

## 2014-10-15 MED ORDER — ESZOPICLONE 2 MG PO TABS: ORAL_TABLET | ORAL | Status: DC

## 2014-10-15 NOTE — Telephone Encounter (Signed)
Tell Rosine she can try lunesta 3-4 times per week  No every night   OK to call in

## 2014-10-15 NOTE — Telephone Encounter (Signed)
RX for Theresa [name] was called in

## 2014-10-15 NOTE — Telephone Encounter (Signed)
[name] is wanting to give the Theresa [name] a try. She is said you discussed this at her vist this past week.

## 2014-10-20 ENCOUNTER — Other Ambulatory Visit: Payer: Self-pay | Admitting: Internal Medicine

## 2014-10-20 ENCOUNTER — Ambulatory Visit (INDEPENDENT_AMBULATORY_CARE_PROVIDER_SITE_OTHER): Payer: Medicare Other | Admitting: Internal Medicine

## 2014-10-20 ENCOUNTER — Encounter: Payer: Self-pay | Admitting: Internal Medicine

## 2014-10-20 ENCOUNTER — Ambulatory Visit
Admission: RE | Admit: 2014-10-20 | Discharge: 2014-10-20 | Disposition: A | Discharge status: Home / Self Care | Payer: Medicare Other | Source: Ambulatory Visit | Attending: Internal Medicine | Admitting: Internal Medicine

## 2014-10-20 VITALS — BP 126/79 | HR 77 | Resp 16 | Ht 66.0 in | Wt 158.0 lb

## 2014-10-20 DIAGNOSIS — M3130 Wegener's granulomatosis without renal involvement: Secondary | ICD-10-CM

## 2014-10-20 DIAGNOSIS — G479 Sleep disorder, unspecified: Secondary | ICD-10-CM

## 2014-10-20 DIAGNOSIS — R51 Headache: Secondary | ICD-10-CM

## 2014-10-20 DIAGNOSIS — H9319 Tinnitus, unspecified ear: Secondary | ICD-10-CM

## 2014-10-20 DIAGNOSIS — R519 Headache, unspecified: Secondary | ICD-10-CM

## 2014-10-20 HISTORY — DX: Sleep disorder, unspecified: G47.9

## 2014-10-20 LAB — SEDIMENTATION RATE: Sed Rate: 4 mm/hr (ref 0–30)

## 2014-10-20 MED ORDER — GADOBENATE DIMEGLUMINE 529 MG/ML IV SOLN: 14.0000 mL | Freq: Once | INTRAVENOUS | Status: AC | PRN

## 2014-10-20 MED ORDER — METHYLPREDNISOLONE ACETATE 80 MG/ML IJ SUSP
160.0000 mg | Freq: Once | INTRAMUSCULAR | Status: AC
  Administered 2014-10-20: 160 mg via INTRAMUSCULAR

## 2014-10-20 NOTE — Progress Notes (Signed)
Subjective:    Patient ID: Theresa [name], female    DOB: 21-Jan-1961, 54 y.o.   MRN: [id number]  HPI  10/12/2013 note HPI Acute visit '  Seen at [facility] for tinnitus on 2/5 Was given Augmentin . Continues with same symptoms. She is wondering if it is the [facility] She has known wegener's  She has appt with ENT at [facility] in 2 days  Tinnitus Keep appt with ENT Etiology unclear at this point  Tinnitus Keep appt with ENT Etiology unclear at this point  TODAY:  Theresa [name] is here for acute visit.    She states she did see Dr. [name] at [facility] on 2/11  She was treated with 30 mg taper (to taper down to 5 mg ) and an additional 4 day course of Augmentin.  She states no blood work or imaging was done  Continues to have tinnitus and headaches for the past two weeks.  Last MRI approx 1 year ago.  She tells me she has chronic pansinusitis .   Headache in back of head.  No visual, speech changes or muscle weakness or paresthesias  Sleep disorder:  She reports she has been on Ativan for years and was able to discontinue this.   When off ativan she has been on Ambien for "years"  .  She is trying to taper this.  She will alternate between 10 mg and 12.5 mg CR.  Sleeps well with controlled release preparation   When not on CR AMbien  She will wake with palpitations and tinnitus during the night   Allergies  Allergen Reactions  . Codeine Itching  . Eletriptan Nausea Only  . Vancomycin Hives  . Tape Rash   Past Medical History  Diagnosis Date  . Wegener's granulomatosis   . Anxiety   . Arthritis   . GERD (gastroesophageal reflux disease)   . Headaches, cluster   . Osteoporosis   . Mitral valve prolapse    Past Surgical History  Procedure Laterality Date  . Tracheostomy closure    . Nasal sinus surgery    . Eye surgery    . Airway foreign body removal    . Mitral valve prolaspe    . Tear duct surgery     History   Social History  . Marital Status: Single    Spouse Name: N/A  .  Number of Children: N/A  . Years of Education: N/A   Occupational History  . unemployeed    Social History Main Topics  . Smoking status: Never Smoker   . Smokeless tobacco: Never Used  . Alcohol Use: No  . Drug Use: No  . Sexual Activity:    Partners: Male   Other Topics Concern  . Not on file   Social History Narrative   Family History  Problem Relation Age of Onset  . Asthma Daughter   . Asthma Sister   . Cancer Sister   . Breast cancer Sister   . CAD Father   . Skin cancer Father   . Heart disease Father   . Osteoporosis Father   . COPD Father   . Prostate cancer Father   . Breast cancer Sister   . Hypertension Mother   . Osteoporosis Mother    Patient Active Problem List   Diagnosis Date Noted  . Injury of left foot 10/08/2014  . Varicose veins of lower extremities with complications 44/11/4740  . GERD (gastroesophageal reflux disease) 07/07/2014  . DJD (degenerative joint disease) 07/07/2014  .  Family history of breast cancer in sister 07/07/2014  . Osteoporosis  on Reclast per Dr. Davonna Belling 07/06/2014  . Anxiety state 07/06/2014  . Insomnia  off BZP now  07/06/2014  . Migraine headache 07/06/2014  . Hx MRSA infection 07/06/2014  . Granulomatosis with polyangiitis (Wegener's) 03/24/2009   Current Outpatient Prescriptions on File Prior to Visit  Medication Sig Dispense Refill  . albuterol (PROVENTIL HFA;VENTOLIN HFA) 108 (90 BASE) MCG/ACT inhaler Inhale 2 puffs into the lungs every 6 (six) hours as needed. For shortness of breath or wheezing    . amoxicillin-clavulanate (AUGMENTIN) 875-125 MG per tablet   0  . Calcium Carbonate-Vitamin D (CALCIUM 600+D) 600-400 MG-UNIT per tablet Take 1 tablet by mouth daily.    Marland Kitchen esomeprazole (NEXIUM) 40 MG capsule Take 40 mg by mouth daily before breakfast.    . eszopiclone (LUNESTA) 2 MG TABS tablet Take one tablet 3-4 times a week at hs 15 tablet 1  . fluticasone (FLONASE) 50 MCG/ACT nasal spray Place 2 sprays into  the nose daily.    . fluticasone (FLOVENT HFA) 110 MCG/ACT inhaler Inhale 2 puffs into the lungs daily.    . Melatonin 5 MG TABS Take by mouth.    . Multiple Vitamin (MULTIVITAMIN) tablet Take 1 tablet by mouth daily.    . Omega-3 Fatty Acids (FISH OIL PO) Take 1 capsule by mouth 3 (three) times daily.    Marland Kitchen oxyCODONE-acetaminophen (PERCOCET/ROXICET) 5-325 MG per tablet Take 1 tablet by mouth every 6 (six) hours as needed for severe pain. (Patient not taking: Reported on 10/12/2014) 60 tablet 0  . predniSONE (DELTASONE) 5 MG tablet Take 5 mg by mouth daily.    Marland Kitchen sulfamethoxazole-trimethoprim (BACTRIM DS,SEPTRA DS) 800-160 MG per tablet Take 1 tablet by mouth once.    Marland Kitchen zolpidem (AMBIEN) 10 MG tablet Take 1/2 tablet 3 times a week prn sleep 15 tablet 1   No current facility-administered medications on file prior to visit.       Review of Systems See HPI    Objective:   Physical Exam Physical Exam  Nursing note and vitals reviewed.  Constitutional: She is oriented to person, place, and time. She appears well-developed and well-nourished.  HENT:  Head: Normocephalic and atraumatic.  Cardiovascular: Normal rate and regular rhythm. Exam reveals no gallop and no friction rub.  No murmur heard.  Pulmonary/Chest: Breath sounds normal. She has no wheezes. She has no rales.  Neurological: She is alert and oriented to person, place, and time.  CNII-XII intact Motor  5/5 UE and LE Reflexes 2+ symmetric Sensory intact to pp Cerebellar intact FTN Skin: Skin is warm and dry.  Psychiatric: She has a normal mood and affect. Her behavior is normal.              Assessment & Plan:  Headaches, tinnitus  With H/O wegeners:    Etiology unclear at this point   Will get MRI  And check sed rate,  CRP.  To empirically cover for exacerbation will give Depomedrol 80 mg IM in office  She is to continue prednisone taper per ENT instructions  Sleep disorder/ambien dependence.  ?? Withdrawal  syndrome:   I do not think there is any addictive component to this.  I think that since her Wegener's diagnosis and critical illness she has relied on hypnotics to sleep and she is trying to  downward taper herself.  I do wonder if part of this is withdrawal syndrome.   See me prn and  in office after MRI is done

## 2014-10-21 ENCOUNTER — Ambulatory Visit (HOSPITAL_BASED_OUTPATIENT_CLINIC_OR_DEPARTMENT_OTHER)
Admission: RE | Admit: 2014-10-21 | Discharge: 2014-10-21 | Disposition: A | Discharge status: Home / Self Care | Payer: Medicare Other | Source: Ambulatory Visit | Attending: Internal Medicine | Admitting: Internal Medicine

## 2014-10-21 ENCOUNTER — Telehealth: Payer: Self-pay | Admitting: *Deleted

## 2014-10-21 DIAGNOSIS — M3130 Wegener's granulomatosis without renal involvement: Secondary | ICD-10-CM | POA: Diagnosis not present

## 2014-10-21 DIAGNOSIS — H9313 Tinnitus, bilateral: Secondary | ICD-10-CM | POA: Diagnosis not present

## 2014-10-21 DIAGNOSIS — R51 Headache: Secondary | ICD-10-CM | POA: Insufficient documentation

## 2014-10-21 LAB — C-REACTIVE PROTEIN

## 2014-10-21 NOTE — Progress Notes (Signed)
Theresa [name] is aware of her MRI results. I mailed her a copy of the results-eh

## 2014-10-21 NOTE — Telephone Encounter (Signed)
Elpidia is calling with a few a questions. 1) She is wanting th name of the counselor with [facility] that you recommend she see. 2) She is wanting to try the [facility] for sleep. She says she just feels bad when she takes the Ambien 3) She is requesting the MRI results from this morning

## 2014-10-22 ENCOUNTER — Other Ambulatory Visit: Payer: Self-pay | Admitting: *Deleted

## 2014-10-22 MED ORDER — ZALEPLON 5 MG PO CAPS: 5.0000 mg | ORAL_CAPSULE | Freq: Every evening | ORAL | Status: DC | PRN

## 2014-10-22 MED ORDER — ZALEPLON 10 MG PO CAPS: ORAL_CAPSULE | ORAL | Status: DC

## 2014-10-22 NOTE — Telephone Encounter (Signed)
Ok to refer to [facility] counselor  [place] sent to [facility] pharmacy

## 2014-10-22 NOTE — Telephone Encounter (Signed)
I spoke with Theresa [name] in regards to her [employer] and the counselor info. She has a follow up appointment with [place] as well-eh

## 2014-10-23 ENCOUNTER — Other Ambulatory Visit: Payer: Self-pay | Admitting: Internal Medicine

## 2014-10-26 ENCOUNTER — Encounter: Payer: Self-pay | Admitting: Internal Medicine

## 2014-10-26 ENCOUNTER — Ambulatory Visit (INDEPENDENT_AMBULATORY_CARE_PROVIDER_SITE_OTHER): Payer: Medicare Other | Admitting: Internal Medicine

## 2014-10-26 VITALS — BP 133/85 | HR 91 | Resp 16 | Ht 66.0 in | Wt 158.0 lb

## 2014-10-26 DIAGNOSIS — H9313 Tinnitus, bilateral: Secondary | ICD-10-CM

## 2014-10-26 DIAGNOSIS — G479 Sleep disorder, unspecified: Secondary | ICD-10-CM

## 2014-10-26 DIAGNOSIS — M3130 Wegener's granulomatosis without renal involvement: Secondary | ICD-10-CM

## 2014-10-26 MED ORDER — ZOLPIDEM TARTRATE 10 MG PO TABS: ORAL_TABLET | ORAL | Status: DC

## 2014-10-26 NOTE — Progress Notes (Signed)
Subjective:    Patient ID: Theresa [name], female    DOB: 1961/03/22, 54 y.o.   MRN: [id number]  HPI Assessment & Plan:  Headaches, tinnitus With H/O wegeners: Etiology unclear at this point Will get MRI And check sed rate, CRP. To empirically cover for exacerbation will give Depomedrol 80 mg IM in office She is to continue prednisone taper per ENT instructions  Sleep disorder/ambien dependence. ?? Withdrawal syndrome: I do not think there is any addictive component to this. I think that since her Wegener's diagnosis and critical illness she has relied on hypnotics to sleep and she is trying to downward taper herself. I do wonder if part of this is withdrawal syndrome.   See me prn and in office after MRI is done              TODAY  Tinnitus  She is now on Levaquin and prednisone 10 mg   MRI neg for auditory canal mass   Insomnia  Sonata did not work.   She would like to continue AMbien  10 mg.  CR preparation makes her groggy  Anxiety  She does not like Ativan and has been on Buspar in the past       Pt will self medicate quite frequently   Allergies  Allergen Reactions  . Codeine Itching  . Eletriptan Nausea Only  . Vancomycin Hives  . Tape Rash   Past Medical History  Diagnosis Date  . Wegener's granulomatosis   . Anxiety   . Arthritis   . GERD (gastroesophageal reflux disease)   . Headaches, cluster   . Osteoporosis   . Mitral valve prolapse    Past Surgical History  Procedure Laterality Date  . Tracheostomy closure    . Nasal sinus surgery    . Eye surgery    . Airway foreign body removal    . Mitral valve prolaspe    . Tear duct surgery     History   Social History  . Marital Status: Divorced    Spouse Name: N/A  . Number of Children: N/A  . Years of Education: N/A   Occupational History  . unemployeed    Social History Main Topics  . Smoking status: Never Smoker   . Smokeless tobacco: Never Used  . Alcohol Use: No    . Drug Use: No  . Sexual Activity:    Partners: Male   Other Topics Concern  . Not on file   Social History Narrative   Family History  Problem Relation Age of Onset  . Asthma Daughter   . Asthma Sister   . Cancer Sister   . Breast cancer Sister   . CAD Father   . Skin cancer Father   . Heart disease Father   . Osteoporosis Father   . COPD Father   . Prostate cancer Father   . Breast cancer Sister   . Hypertension Mother   . Osteoporosis Mother    Patient Active Problem List   Diagnosis Date Noted  . Sleep disorder 10/20/2014  . Injury of left foot 10/08/2014  . Varicose veins of lower extremities with complications 53/29/9242  . GERD (gastroesophageal reflux disease) 07/07/2014  . DJD (degenerative joint disease) 07/07/2014  . Family history of breast cancer in sister 07/07/2014  . Osteoporosis  on Reclast per Dr. Davonna Belling 07/06/2014  . Anxiety state 07/06/2014  . Insomnia  off BZP now  07/06/2014  . Migraine headache 07/06/2014  . Hx MRSA infection  07/06/2014  . Granulomatosis with polyangiitis (Wegener's) 03/24/2009   Current Outpatient Prescriptions on File Prior to Visit  Medication Sig Dispense Refill  . albuterol (PROVENTIL HFA;VENTOLIN HFA) 108 (90 BASE) MCG/ACT inhaler Inhale 2 puffs into the lungs every 6 (six) hours as needed. For shortness of breath or wheezing    . amoxicillin-clavulanate (AUGMENTIN) 875-125 MG per tablet   0  . Calcium Carbonate-Vitamin D (CALCIUM 600+D) 600-400 MG-UNIT per tablet Take 1 tablet by mouth daily.    Marland Kitchen esomeprazole (NEXIUM) 40 MG capsule Take 40 mg by mouth daily before breakfast.    . fluticasone (FLONASE) 50 MCG/ACT nasal spray Place 2 sprays into the nose daily.    . fluticasone (FLOVENT HFA) 110 MCG/ACT inhaler Inhale 2 puffs into the lungs daily.    . Melatonin 5 MG TABS Take by mouth.    . Multiple Vitamin (MULTIVITAMIN) tablet Take 1 tablet by mouth daily.    . Omega-3 Fatty Acids (FISH OIL PO) Take 1 capsule  by mouth 3 (three) times daily.    . predniSONE (DELTASONE) 5 MG tablet Take 5 mg by mouth daily.    Marland Kitchen sulfamethoxazole-trimethoprim (BACTRIM DS,SEPTRA DS) 800-160 MG per tablet Take 1 tablet by mouth once.    . zaleplon (SONATA) 5 MG capsule Take 1 capsule (5 mg total) by mouth at bedtime as needed for sleep. 20 capsule 1  . zolpidem (AMBIEN) 10 MG tablet Take 1/2 tablet 3 times a week prn sleep 15 tablet 1   No current facility-administered medications on file prior to visit.       Review of Systems See HPI    Objective:   Physical Exam  Physical Exam  Nursing note and vitals reviewed.  Constitutional: She is oriented to person, place, and time. She appears well-developed and well-nourished.  HENT:  Head: Normocephalic and atraumatic.  Cardiovascular: Normal rate and regular rhythm. Exam reveals no gallop and no friction rub.  No murmur heard.  Pulmonary/Chest: Breath sounds normal. She has no wheezes. She has no rales.  Neurological: She is alert and oriented to person, place, and time.  Skin: Skin is warm and dry.  Psychiatric: She has a normal mood and affect. Her behavior is normal.      Assessment & Plan:  Tinnitus  Managed by Dr.Clinger  Chronic insomnia  I counseled pt it is not a good idea to self medicate.   D/C sonata  D/C  ambien CR    Ok for Ambien 10 mg.   If further sleep disorder issues will need referral  H/O wegeners

## 2014-10-28 ENCOUNTER — Ambulatory Visit: Payer: Self-pay | Admitting: Family Medicine

## 2014-10-29 ENCOUNTER — Ambulatory Visit (INDEPENDENT_AMBULATORY_CARE_PROVIDER_SITE_OTHER): Payer: Medicare Other | Admitting: Family Medicine

## 2014-10-29 ENCOUNTER — Encounter: Payer: Self-pay | Admitting: Family Medicine

## 2014-10-29 ENCOUNTER — Ambulatory Visit (HOSPITAL_BASED_OUTPATIENT_CLINIC_OR_DEPARTMENT_OTHER)
Admission: RE | Admit: 2014-10-29 | Discharge: 2014-10-29 | Disposition: A | Discharge status: Home / Self Care | Payer: Medicare Other | Source: Ambulatory Visit | Attending: Family Medicine | Admitting: Family Medicine

## 2014-10-29 VITALS — BP 120/79 | HR 80 | Ht 66.0 in | Wt 158.0 lb

## 2014-10-29 DIAGNOSIS — S99922D Unspecified injury of left foot, subsequent encounter: Secondary | ICD-10-CM

## 2014-10-29 DIAGNOSIS — M79672 Pain in left foot: Secondary | ICD-10-CM

## 2014-10-29 DIAGNOSIS — X58XXXD Exposure to other specified factors, subsequent encounter: Secondary | ICD-10-CM | POA: Insufficient documentation

## 2014-10-29 DIAGNOSIS — S92355D Nondisplaced fracture of fifth metatarsal bone, left foot, subsequent encounter for fracture with routine healing: Secondary | ICD-10-CM | POA: Diagnosis present

## 2014-10-30 ENCOUNTER — Ambulatory Visit: Payer: Medicare Other | Admitting: Psychology

## 2014-10-31 ENCOUNTER — Other Ambulatory Visit: Payer: Self-pay

## 2014-11-02 NOTE — Progress Notes (Signed)
PCP: SCHOENHOFF,DEBBIE, MD  Subjective:   HPI: Patient is a 54 y.o. female here for left ankle/foot injury.  2/3: Patient reports she first injured her left foot and ankle on 12/18 when she inverted this. A lot of pain and swelling laterally. Went to an orthopedic urgent care and had x-rays - told they looked ok but she could have a hairline fracture. 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 Never completely improved and reinjured foot same way 2-3 weeks later. Tried advil. Still swelling some. No prior injuries to these.  2/25: Patient reports she has improved since last visit. Minimal pain at 1/10 level. Slight swelling. Tolerating boot without weight bearing.  Past Medical History  Diagnosis Date  . Wegener's granulomatosis   . Anxiety   . Arthritis   . GERD (gastroesophageal reflux disease)   . Headaches, cluster   . Osteoporosis   . Mitral valve prolapse     Current Outpatient Prescriptions on File Prior to Visit  Medication Sig Dispense Refill  . albuterol (PROVENTIL HFA;VENTOLIN HFA) 108 (90 BASE) MCG/ACT inhaler Inhale 2 puffs into the lungs every 6 (six) hours as needed. For shortness of breath or wheezing    . Calcium Carbonate-Vitamin D (CALCIUM 600+D) 600-400 MG-UNIT per tablet Take 1 tablet by mouth daily.    Marland Kitchen esomeprazole (NEXIUM) 40 MG capsule Take 40 mg by mouth daily before breakfast.    . fluticasone (FLONASE) 50 MCG/ACT nasal spray Place 2 sprays into the nose daily.    . fluticasone (FLOVENT HFA) 110 MCG/ACT inhaler Inhale 2 puffs into the lungs daily.    . Hypertonic Nasal Wash (SINUS RINSE BOTTLE KIT NA) Place into the nose.    . levofloxacin (LEVAQUIN) 500 MG tablet Take 500 mg by mouth.    . Melatonin 5 MG TABS Take by mouth.    . Multiple Vitamin (MULTIVITAMIN) tablet Take 1 tablet by mouth daily.    . Omega-3 Fatty Acids (FISH OIL PO) Take 1 capsule by mouth 3 (three) times daily.    . predniSONE (DELTASONE) 10 MG tablet   0  . predniSONE  (DELTASONE) 5 MG tablet Take 5 mg by mouth daily.    Marland Kitchen sulfamethoxazole-trimethoprim (BACTRIM DS,SEPTRA DS) 800-160 MG per tablet Take 1 tablet by mouth once.    Marland Kitchen zolpidem (AMBIEN) 10 MG tablet Take one tablet at HS prn sleep 30 tablet 1   No current facility-administered medications on file prior to visit.    Past Surgical History  Procedure Laterality Date  . Tracheostomy closure    . Nasal sinus surgery    . Eye surgery    . Airway foreign body removal    . Mitral valve prolaspe    . Tear duct surgery      Allergies  Allergen Reactions  . Codeine Itching  . Eletriptan Nausea Only  . Vancomycin Hives  . Tape Rash    History   Social History  . Marital Status: Divorced    Spouse Name: N/A  . Number of Children: N/A  . Years of Education: N/A   Occupational History  . unemployeed    Social History Main Topics  . Smoking status: Never Smoker   . Smokeless tobacco: Never Used  . Alcohol Use: No  . Drug Use: No  . Sexual Activity:    Partners: Male   Other Topics Concern  . Not on file   Social History Narrative    Family History  Problem Relation Age of  Onset  . Asthma Daughter   . Asthma Sister   . Cancer Sister   . Breast cancer Sister   . CAD Father   . Skin cancer Father   . Heart disease Father   . Osteoporosis Father   . COPD Father   . Prostate cancer Father   . Breast cancer Sister   . Hypertension Mother   . Osteoporosis Mother     BP 120/79 mmHg  Pulse 80  Ht 5' 6" (1.676 m)  Wt 158 lb (71.668 kg)  BMI 25.51 kg/m2  Review of Systems: See HPI above.    Objective:  Physical Exam:  Gen: NAD  Left foot/ankle: Minimal swelling, no bruising lateral foot.  No other deformity. FROM with pain on external rotation. No TTP base 5th metatarsal.  No other tenderness of foot or ankle. Negative ant drawer, 1+ talar tilt.   Negative syndesmotic compression. Thompsons test negative. NV intact distally.    Assessment & Plan:  1. Left  foot/ankle injury - Repeat radiographs show callus formation at [place] fracture site and she is no longer tender here.  Some pain related to immobility and the sprain as well.  Switch to weight bearing in the cam walker and follow-up in 2 weeks.

## 2014-11-02 NOTE — Assessment & Plan Note (Signed)
Repeat radiographs show callus formation at [place] fracture site and she is no longer tender here.  Some pain related to immobility and the sprain as well.  Switch to weight bearing in the cam walker and follow-up in 2 weeks.

## 2014-11-03 ENCOUNTER — Encounter: Payer: Self-pay | Admitting: *Deleted

## 2014-11-03 ENCOUNTER — Telehealth: Payer: Self-pay | Admitting: *Deleted

## 2014-11-03 NOTE — Telephone Encounter (Signed)
Theresa [name] called and wanted you to know that she did see the ENT at [facility]. She didn't really have any answers for the ringing in her ear. He said if it continues she can try audiology. She also said that she is still having heart palpitations at night. She is wanting to know if she needs a beta blocker for this.

## 2014-11-03 NOTE — Telephone Encounter (Signed)
Theresa [name] is requesting a referral to a Cardiologist.

## 2014-11-03 NOTE — Telephone Encounter (Signed)
I would not place her on a beta blocker without being evaluated by cardiology.  If she wishes referral ok to refer to [facility] cardiology  Route back her response

## 2014-11-04 ENCOUNTER — Other Ambulatory Visit: Payer: Self-pay | Admitting: Internal Medicine

## 2014-11-04 DIAGNOSIS — R002 Palpitations: Secondary | ICD-10-CM

## 2014-11-04 NOTE — Progress Notes (Signed)
Referral for [facility] cardiology. They will contact patient with appointment-eh

## 2014-11-09 ENCOUNTER — Telehealth: Payer: Self-pay | Admitting: *Deleted

## 2014-11-09 NOTE — Telephone Encounter (Signed)
Theresa [name] called and said that she is still having the ringing and anxiety/panic attacks. She is asking for something to help with her this. She said this was discussed at her last visit but she wanted to wait but now she wants to try something

## 2014-11-10 NOTE — Telephone Encounter (Signed)
She has been on Buspar in the past.  If she would like to try this again  I can order for her.  For her tinnitus she needs to call her ENT if it is not improving.   Route back with response

## 2014-11-10 NOTE — Telephone Encounter (Signed)
No guarantee about ringing in ears but it is reasonable to try Buspar if this helped her in past without any ringing in her ears  She can also wait until she sees [facility] specialist as well and discuss with specialist

## 2014-11-10 NOTE — Telephone Encounter (Signed)
Avon is asking if the Buspar or the Lexapro would cause her ringing to get worse. I told her it would be hard to tell until she tries it. She is asking that we call in which ever one you feel will be less likely to cause her more problems.

## 2014-11-11 ENCOUNTER — Other Ambulatory Visit: Payer: Self-pay | Admitting: Internal Medicine

## 2014-11-11 MED ORDER — BUSPIRONE HCL 7.5 MG PO TABS: ORAL_TABLET | ORAL | Status: DC

## 2014-11-11 NOTE — Telephone Encounter (Signed)
She wants to go ahead and get started on this now. She does not want to wait to discuss with [facility] specialist. Can we go ahead and send into her pharmacy

## 2014-11-11 NOTE — Telephone Encounter (Signed)
Theresa [name]  SEnt [facility] to [facility].  Call pt and counsel her to take medication faithfully twice a day.  Schedule 30 min OV to see me in 3-4 weeks

## 2014-11-11 NOTE — Telephone Encounter (Signed)
Theresa [name] is aware that the Buspar was called in and she was instructed to take it faithfully BID. She has an appointment on 12/07/14 with [place]

## 2014-11-12 ENCOUNTER — Ambulatory Visit: Payer: Medicare Other | Admitting: Family Medicine

## 2014-11-12 ENCOUNTER — Encounter: Payer: Self-pay | Admitting: Family Medicine

## 2014-11-12 ENCOUNTER — Ambulatory Visit (INDEPENDENT_AMBULATORY_CARE_PROVIDER_SITE_OTHER): Payer: Medicare Other | Admitting: Family Medicine

## 2014-11-12 VITALS — BP 118/80 | HR 85 | Ht 66.0 in | Wt 158.0 lb

## 2014-11-12 DIAGNOSIS — S99922D Unspecified injury of left foot, subsequent encounter: Secondary | ICD-10-CM

## 2014-11-17 ENCOUNTER — Telehealth: Payer: Self-pay | Admitting: *Deleted

## 2014-11-17 NOTE — Progress Notes (Signed)
PCP: SCHOENHOFF,DEBBIE, MD  Subjective:   HPI: Patient is a 54 y.o. female here for left ankle/foot injury.  2/3: Patient reports she first injured her left foot and ankle on 12/18 when she inverted this. A lot of pain and swelling laterally. Went to an orthopedic urgent care and had x-rays - told they looked ok but she could have a hairline fracture. 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 Never completely improved and reinjured foot same way 2-3 weeks later. Tried advil. Still swelling some. No prior injuries to these.  2/25: Patient reports she has improved since last visit. Minimal pain at 1/10 level. Slight swelling. Tolerating boot without weight bearing.  3/10: Patient reports she is doing well. Able to ambulate in the boot with some ankle soreness. Slight swelling. Not taking anything for pain. No other complaints.  Past Medical History  Diagnosis Date  . Wegener's granulomatosis   . Anxiety   . Arthritis   . GERD (gastroesophageal reflux disease)   . Headaches, cluster   . Osteoporosis   . Mitral valve prolapse     Current Outpatient Prescriptions on File Prior to Visit  Medication Sig Dispense Refill  . albuterol (PROVENTIL HFA;VENTOLIN HFA) 108 (90 BASE) MCG/ACT inhaler Inhale 2 puffs into the lungs every 6 (six) hours as needed. For shortness of breath or wheezing    . busPIRone (BUSPAR) 7.5 MG tablet Take one tablet bid 60 tablet 1  . Calcium Carbonate-Vitamin D (CALCIUM 600+D) 600-400 MG-UNIT per tablet Take 1 tablet by mouth daily.    Marland Kitchen esomeprazole (NEXIUM) 40 MG capsule Take 40 mg by mouth daily before breakfast.    . fluticasone (FLONASE) 50 MCG/ACT nasal spray Place 2 sprays into the nose daily.    . fluticasone (FLOVENT HFA) 110 MCG/ACT inhaler Inhale 2 puffs into the lungs daily.    . Hypertonic Nasal Wash (SINUS RINSE BOTTLE KIT NA) Place into the nose.    . Melatonin 5 MG TABS Take by mouth.    . Multiple Vitamin (MULTIVITAMIN) tablet  Take 1 tablet by mouth daily.    . Omega-3 Fatty Acids (FISH OIL PO) Take 1 capsule by mouth 3 (three) times daily.    . predniSONE (DELTASONE) 10 MG tablet   0  . predniSONE (DELTASONE) 5 MG tablet Take 5 mg by mouth daily.    Marland Kitchen sulfamethoxazole-trimethoprim (BACTRIM DS,SEPTRA DS) 800-160 MG per tablet Take 1 tablet by mouth once.    Marland Kitchen zolpidem (AMBIEN) 10 MG tablet Take one tablet at HS prn sleep 30 tablet 1   No current facility-administered medications on file prior to visit.    Past Surgical History  Procedure Laterality Date  . Tracheostomy closure    . Nasal sinus surgery    . Eye surgery    . Airway foreign body removal    . Mitral valve prolaspe    . Tear duct surgery      Allergies  Allergen Reactions  . Codeine Itching  . Eletriptan Nausea Only  . Vancomycin Hives  . Tape Rash    History   Social History  . Marital Status: Divorced    Spouse Name: N/A  . Number of Children: N/A  . Years of Education: N/A   Occupational History  . unemployeed    Social History Main Topics  . Smoking status: Never Smoker   . Smokeless tobacco: Never Used  . Alcohol Use: No  . Drug Use: No  . Sexual Activity:  Partners: Male   Other Topics Concern  . Not on file   Social History Narrative    Family History  Problem Relation Age of Onset  . Asthma Daughter   . Asthma Sister   . Cancer Sister   . Breast cancer Sister   . CAD Father   . Skin cancer Father   . Heart disease Father   . Osteoporosis Father   . COPD Father   . Prostate cancer Father   . Breast cancer Sister   . Hypertension Mother   . Osteoporosis Mother     BP 118/80 mmHg  Pulse 85  Ht $R'5\' 6"'ZG$  (1.676 m)  Wt 158 lb (71.668 kg)  BMI 25.51 kg/m2  Review of Systems: See HPI above.    Objective:  Physical Exam:  Gen: NAD  Left foot/ankle: Minimal swelling, no bruising lateral foot.  No other deformity. FROM with pain on external rotation. No TTP base 5th metatarsal.  No other  tenderness of foot or ankle. Negative ant drawer, 1+ talar tilt.   Negative syndesmotic compression. Thompsons test negative. NV intact distally.    Assessment & Plan:  1. Left foot/ankle injury - Radiographs showed callus formation and she does not have tenderness at fracture site any longer.  Is also more than 8 weeks out from injury.  Can switch from cam walker to a well supportive shoe.  Call us if she has any problems.  Consider physical therapy if she struggles.  Otherwise f/u prn.

## 2014-11-17 NOTE — Telephone Encounter (Signed)
Spoke to patient and gave the information.

## 2014-11-17 NOTE — Assessment & Plan Note (Signed)
Radiographs showed callus formation and she does not have tenderness at fracture site any longer.  Is also more than 8 weeks out from injury.  Can switch from cam walker to a well supportive shoe.  Call us if she has any problems.  Consider physical therapy if she struggles.  Otherwise f/u prn.

## 2014-11-17 NOTE — Telephone Encounter (Signed)
Patient called and left message. Called patient and she wanted to know if the swelling is normal. She stated that her ankle was swollen last night and she iced the ankle along with elevating ankle. She stated she did the elliptical and did a lot of walking yesterday. Is the swelling the last symptom that goes away?

## 2014-11-17 NOTE — Telephone Encounter (Signed)
Swelling and stiffness is to be expected.  Those are the last things to go away and can take months (the swelling can take months).  But she shouldn't be limping and pain shouldn't get above a 3 on a scale of 1-10

## 2014-11-18 ENCOUNTER — Ambulatory Visit (INDEPENDENT_AMBULATORY_CARE_PROVIDER_SITE_OTHER): Payer: Medicare Other | Admitting: Internal Medicine

## 2014-11-18 ENCOUNTER — Encounter: Payer: Self-pay | Admitting: Internal Medicine

## 2014-11-18 VITALS — BP 121/76 | HR 74 | Resp 16 | Ht 66.0 in | Wt 158.0 lb

## 2014-11-18 DIAGNOSIS — G4700 Insomnia, unspecified: Secondary | ICD-10-CM

## 2014-11-18 DIAGNOSIS — F419 Anxiety disorder, unspecified: Secondary | ICD-10-CM

## 2014-11-18 MED ORDER — LORAZEPAM 2 MG PO TABS: ORAL_TABLET | ORAL | Status: DC

## 2014-11-18 NOTE — Progress Notes (Signed)
Subjective:    Patient ID: Theresa [name], female    DOB: 04-19-1961, 54 y.o.   MRN: [id number]  HPI 10/26/2014 Assessment & Plan:  Tinnitus Managed by Dr.Clinger  Chronic insomnia I counseled pt it is not a good idea to self medicate. D/C sonata D/C ambien CR Ok for Ambien 10 mg. If further sleep disorder issues will need referral  H/O wegeners              TODAY  Darrien is here for acute visit.    Since my visit she has been to [facility] for her tinnitus.   She reports increasing anxiety causing her heart to race,  Worsening tinnitus and insomnia.  She had been on Ativan in the past but stopped due to fear in dementia she read about.   Buspar causing palpitations   Allergies  Allergen Reactions  . Codeine Itching  . Eletriptan Nausea Only  . Vancomycin Hives  . Tape Rash   Past Medical History  Diagnosis Date  . Wegener's granulomatosis   . Anxiety   . Arthritis   . GERD (gastroesophageal reflux disease)   . Headaches, cluster   . Osteoporosis   . Mitral valve prolapse    Past Surgical History  Procedure Laterality Date  . Tracheostomy closure    . Nasal sinus surgery    . Eye surgery    . Airway foreign body removal    . Mitral valve prolaspe    . Tear duct surgery     History   Social History  . Marital Status: Divorced    Spouse Name: N/A  . Number of Children: N/A  . Years of Education: N/A   Occupational History  . unemployeed    Social History Main Topics  . Smoking status: Never Smoker   . Smokeless tobacco: Never Used  . Alcohol Use: No  . Drug Use: No  . Sexual Activity:    Partners: Male   Other Topics Concern  . Not on file   Social History Narrative   Family History  Problem Relation Age of Onset  . Asthma Daughter   . Asthma Sister   . Cancer Sister   . Breast cancer Sister   . CAD Father   . Skin cancer Father   . Heart disease Father   . Osteoporosis Father   . COPD Father   . Prostate cancer  Father   . Breast cancer Sister   . Hypertension Mother   . Osteoporosis Mother    Patient Active Problem List   Diagnosis Date Noted  . Sleep disorder 10/20/2014  . Injury of left foot 10/08/2014  . Varicose veins of lower extremities with complications 40/98/1191  . GERD (gastroesophageal reflux disease) 07/07/2014  . DJD (degenerative joint disease) 07/07/2014  . Family history of breast cancer in sister 07/07/2014  . Osteoporosis  on Reclast per Dr. Davonna Belling 07/06/2014  . Anxiety state 07/06/2014  . Insomnia  off BZP now  07/06/2014  . Migraine headache 07/06/2014  . Hx MRSA infection 07/06/2014  . Granulomatosis with polyangiitis (Wegener's) 03/24/2009   Current Outpatient Prescriptions on File Prior to Visit  Medication Sig Dispense Refill  . albuterol (PROVENTIL HFA;VENTOLIN HFA) 108 (90 BASE) MCG/ACT inhaler Inhale 2 puffs into the lungs every 6 (six) hours as needed. For shortness of breath or wheezing    . busPIRone (BUSPAR) 7.5 MG tablet Take one tablet bid 60 tablet 1  . Calcium Carbonate-Vitamin D (CALCIUM 600+D) 600-400 MG-UNIT  per tablet Take 1 tablet by mouth daily.    Marland Kitchen esomeprazole (NEXIUM) 40 MG capsule Take 40 mg by mouth daily before breakfast.    . fluticasone (FLONASE) 50 MCG/ACT nasal spray Place 2 sprays into the nose daily.    . fluticasone (FLOVENT HFA) 110 MCG/ACT inhaler Inhale 2 puffs into the lungs daily.    . Hypertonic Nasal Wash (SINUS RINSE BOTTLE KIT NA) Place into the nose.    . Melatonin 5 MG TABS Take by mouth.    . Multiple Vitamin (MULTIVITAMIN) tablet Take 1 tablet by mouth daily.    . Omega-3 Fatty Acids (FISH OIL PO) Take 1 capsule by mouth 3 (three) times daily.    . predniSONE (DELTASONE) 10 MG tablet   0  . predniSONE (DELTASONE) 5 MG tablet Take 5 mg by mouth daily.    Marland Kitchen sulfamethoxazole-trimethoprim (BACTRIM DS,SEPTRA DS) 800-160 MG per tablet Take 1 tablet by mouth once.    Marland Kitchen zolpidem (AMBIEN) 10 MG tablet Take one tablet at HS  prn sleep 30 tablet 1   No current facility-administered medications on file prior to visit.       Review of Systems See HPI    Objective:   Physical Exam  Physical Exam  Nursing note and vitals reviewed.  Very tangential in topics Constitutional: She is oriented to person, place, and time. She appears well-developed and well-nourished.  HENT:  Head: Normocephalic and atraumatic.  Cardiovascular: Normal rate and regular rhythm. Exam reveals no gallop and no friction rub.  No murmur heard.  Pulmonary/Chest: Breath sounds normal. She has no wheezes. She has no rales.  Neurological: She is alert and oriented to person, place, and time.  Skin: Skin is warm and dry.  Psychiatric: She has a normal mood and affect. Her behavior is normal.             Assessment & Plan:  Anxiety  Ok to taper off Buspar one tablet qod for one week then stop  RX Ativan 2 mg  1/2 tab bid.  May repeat 1/2 tab in 30 mins if not improved  Chronic insomnia  Advised not to take both Ambien and Ativan.  Pt self medicates and frequently changes doses despite my counsel not to do so

## 2014-11-24 ENCOUNTER — Ambulatory Visit: Payer: Self-pay | Admitting: Internal Medicine

## 2014-12-06 NOTE — Progress Notes (Signed)
Subjective:    Patient ID: Theresa [name], female    DOB: [birth year], 54 y.o.   MRN: [id number]  HPI  11/18/2014 note Assessment & Plan:  Anxiety Ok to taper off Buspar one tablet qod for one week then stop RX Ativan 2 mg 1/2 tab bid. May repeat 1/2 tab in 30 mins if not improved  Chronic insomnia Advised not to take both Ambien and Ativan. Pt self medicates and frequently changes doses despite my counsel not to do so          [name] is here for CPE  HM:  See scanned sheet   She is a non-smoker  With H/O Wegeners she has been advised not to take live vaccines at this point   Anxiety doing better on low dose Ativan   Allergies  Allergen Reactions  . Codeine Itching  . Eletriptan Nausea Only  . Vancomycin Hives  . Tape Rash   Past Medical History  Diagnosis Date  . Wegener's granulomatosis   . Anxiety   . Arthritis   . GERD (gastroesophageal reflux disease)   . Headaches, cluster   . Osteoporosis   . Mitral valve prolapse    Past Surgical History  Procedure Laterality Date  . Tracheostomy closure    . Nasal sinus surgery    . Eye surgery    . Airway foreign body removal    . Mitral valve prolaspe    . Tear duct surgery     History   Social History  . Marital Status: Divorced    Spouse Name: N/A  . Number of Children: N/A  . Years of Education: N/A   Occupational History  . unemployeed    Social History Main Topics  . Smoking status: Never Smoker   . Smokeless tobacco: Never Used  . Alcohol Use: No  . Drug Use: No  . Sexual Activity:    Partners: Male   Other Topics Concern  . Not on file   Social History Narrative   Family History  Problem Relation Age of Onset  . Asthma Daughter   . Asthma Sister   . Cancer Sister   . Breast cancer Sister   . CAD Father   . Skin cancer Father   . Heart disease Father   . Osteoporosis Father   . COPD Father   . Prostate cancer Father   . Breast cancer Sister   . Hypertension  Mother   . Osteoporosis Mother    Patient Active Problem List   Diagnosis Date Noted  . Sleep disorder 10/20/2014  . Injury of left foot 10/08/2014  . Varicose veins of lower extremities with complications 10/06/2014  . GERD (gastroesophageal reflux disease) 07/07/2014  . DJD (degenerative joint disease) 07/07/2014  . Family history of breast cancer in sister 07/07/2014  . Osteoporosis  on Reclast per Dr. Richardson Landry 07/06/2014  . Anxiety state 07/06/2014  . Insomnia  off BZP now  07/06/2014  . Migraine headache 07/06/2014  . Hx MRSA infection 07/06/2014  . Granulomatosis with polyangiitis (Wegener's) 03/24/2009   Current Outpatient Prescriptions on File Prior to Visit  Medication Sig Dispense Refill  . albuterol (PROVENTIL HFA;VENTOLIN HFA) 108 (90 BASE) MCG/ACT inhaler Inhale 2 puffs into the lungs every 6 (six) hours as needed. For shortness of breath or wheezing    . busPIRone (BUSPAR) 7.5 MG tablet Take one tablet bid 60 tablet 1  . Calcium Carbonate-Vitamin D (CALCIUM 600+D) 600-400 MG-UNIT per tablet Take  1 tablet by mouth daily.    Marland Kitchen esomeprazole (NEXIUM) 40 MG capsule Take 40 mg by mouth daily before breakfast.    . fluticasone (FLONASE) 50 MCG/ACT nasal spray Place 2 sprays into the nose daily.    . fluticasone (FLOVENT HFA) 110 MCG/ACT inhaler Inhale 2 puffs into the lungs daily.    . Hypertonic Nasal Wash (SINUS RINSE BOTTLE KIT NA) Place into the nose.    Marland Kitchen LORazepam (ATIVAN) 2 MG tablet Take one half tablet bid for anxiety.  May repeat 1/2 pill in 30 minutes if not better 30 tablet 1  . Melatonin 5 MG TABS Take by mouth.    . Multiple Vitamin (MULTIVITAMIN) tablet Take 1 tablet by mouth daily.    . Omega-3 Fatty Acids (FISH OIL PO) Take 1 capsule by mouth 3 (three) times daily.    . predniSONE (DELTASONE) 10 MG tablet   0  . predniSONE (DELTASONE) 5 MG tablet Take 5 mg by mouth daily.    Marland Kitchen sulfamethoxazole-trimethoprim (BACTRIM DS,SEPTRA DS) 800-160 MG per tablet Take  1 tablet by mouth once.    Marland Kitchen zolpidem (AMBIEN) 10 MG tablet Take one tablet at HS prn sleep 30 tablet 1   No current facility-administered medications on file prior to visit.     Review of Systems See hPI    Objective:   Physical Exam  Physical Exam  Vital signs and nursing note reviewed  Constitutional: She is oriented to person, place, and time. She appears well-developed and well-nourished. She is cooperative.  HENT:  Head: Normocephalic and atraumatic.  Right Ear: Tympanic membrane normal.  Left Ear: Tympanic membrane normal.  Nose: Nose normal.  Mouth/Throat: Oropharynx is clear and moist and mucous membranes are normal. No oropharyngeal exudate or posterior oropharyngeal erythema.  Eyes: Conjunctivae and EOM are normal. Pupils are equal, round, and reactive to light.  Neck: Neck supple. No JVD present. Carotid bruit is not present. No mass and no thyromegaly present.  Cardiovascular: Regular rhythm, normal heart sounds, intact distal pulses and normal pulses.  Exam reveals no gallop and no friction rub.   No murmur heard. Pulses:      Dorsalis pedis pulses are 2+ on the right side, and 2+ on the left side.  Pulmonary/Chest: Breath sounds normal. She has no wheezes. She has no rhonchi. She has no rales. Right breast exhibits no mass, no nipple discharge and no skin change. Left breast exhibits no mass, no nipple discharge and no skin change.  Abdominal: Soft. Bowel sounds are normal. She exhibits no distension and no mass. There is no hepatosplenomegaly. There is no tenderness. There is no CVA tenderness.  Genitourinary: Rectum normal, vagina normal and uterus normal. Rectal exam shows no mass. Guaiac negative stool. No labial fusion. There is no lesion on the right labia. There is no lesion on the left labia. Cervix exhibits no motion tenderness. Right adnexum displays no mass, no tenderness and no fullness. Left adnexum displays no mass, no tenderness and no fullness. No  erythema around the vagina.  Musculoskeletal:       No active synovitis to any joint.    Lymphadenopathy:       Right cervical: No superficial cervical adenopathy present.      Left cervical: No superficial cervical adenopathy present.       Right axillary: No pectoral and no lateral adenopathy present.       Left axillary: No pectoral and no lateral adenopathy present.      Right:  No inguinal adenopathy present.       Left: No inguinal adenopathy present.  Neurological: She is alert and oriented to person, place, and time. She has normal strength and normal reflexes. No cranial nerve deficit or sensory deficit. She displays a negative Romberg sign. Coordination and gait normal.  Skin: Skin is warm and dry. No abrasion, no bruising, no ecchymosis and no rash noted. No cyanosis. Nails show no clubbing.  Psychiatric: She has a normal mood and affect. Her speech is normal and behavior is normal.          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 Davonna Belling  FH breast CA in sister       Assessment & Plan:

## 2014-12-07 ENCOUNTER — Ambulatory Visit (INDEPENDENT_AMBULATORY_CARE_PROVIDER_SITE_OTHER): Payer: Medicare Other | Admitting: Internal Medicine

## 2014-12-07 ENCOUNTER — Encounter: Payer: Self-pay | Admitting: *Deleted

## 2014-12-07 ENCOUNTER — Encounter: Payer: Self-pay | Admitting: Internal Medicine

## 2014-12-07 VITALS — BP 127/82 | HR 64 | Resp 16 | Ht 66.0 in | Wt 160.0 lb

## 2014-12-07 DIAGNOSIS — H9313 Tinnitus, bilateral: Secondary | ICD-10-CM

## 2014-12-07 DIAGNOSIS — K219 Gastro-esophageal reflux disease without esophagitis: Secondary | ICD-10-CM

## 2014-12-07 DIAGNOSIS — E042 Nontoxic multinodular goiter: Secondary | ICD-10-CM

## 2014-12-07 DIAGNOSIS — Z0000 Encounter for general adult medical examination without abnormal findings: Secondary | ICD-10-CM

## 2014-12-07 DIAGNOSIS — Z1211 Encounter for screening for malignant neoplasm of colon: Secondary | ICD-10-CM | POA: Diagnosis not present

## 2014-12-07 DIAGNOSIS — F419 Anxiety disorder, unspecified: Secondary | ICD-10-CM | POA: Diagnosis not present

## 2014-12-07 DIAGNOSIS — M3130 Wegener's granulomatosis without renal involvement: Secondary | ICD-10-CM

## 2014-12-07 DIAGNOSIS — G4700 Insomnia, unspecified: Secondary | ICD-10-CM | POA: Diagnosis not present

## 2014-12-07 DIAGNOSIS — M810 Age-related osteoporosis without current pathological fracture: Secondary | ICD-10-CM

## 2014-12-07 HISTORY — DX: Nontoxic multinodular goiter: E04.2

## 2014-12-07 LAB — HEMOCCULT GUIAC POC 1CARD (OFFICE): Fecal Occult Blood, POC: NEGATIVE

## 2014-12-07 LAB — POCT URINALYSIS DIPSTICK
Bilirubin, UA: NEGATIVE
GLUCOSE UA: NEGATIVE
Ketones, UA: NEGATIVE
LEUKOCYTES UA: NEGATIVE
Nitrite, UA: NEGATIVE
Protein, UA: NEGATIVE
RBC UA: NEGATIVE
SPEC GRAV UA: 1.01
Urobilinogen, UA: NEGATIVE
pH, UA: 6.5

## 2014-12-08 ENCOUNTER — Telehealth: Payer: Self-pay | Admitting: *Deleted

## 2014-12-08 NOTE — Telephone Encounter (Signed)
Theresa [name] called and requested a RX for Acyclovir 500mg  TID for 30 days. She has been reading a research study that said that this would help. She said that maybe her ear ringing is viral. I spoke with Dr. Coralyn Mark and she prefers that Theresa [name] talk with her ENT about this request. I spoke with [name] and she voiced understanding-eh

## 2014-12-09 ENCOUNTER — Other Ambulatory Visit: Payer: Self-pay | Admitting: *Deleted

## 2014-12-09 DIAGNOSIS — I83893 Varicose veins of bilateral lower extremities with other complications: Secondary | ICD-10-CM

## 2014-12-16 ENCOUNTER — Ambulatory Visit: Payer: Self-pay | Admitting: Cardiology

## 2014-12-25 ENCOUNTER — Other Ambulatory Visit: Payer: Self-pay | Admitting: Internal Medicine

## 2014-12-29 ENCOUNTER — Telehealth: Payer: Self-pay | Admitting: Internal Medicine

## 2014-12-29 MED ORDER — ZOLPIDEM TARTRATE 10 MG PO TABS: ORAL_TABLET | ORAL | Status: AC

## 2014-12-29 NOTE — Telephone Encounter (Signed)
Ambien called into [facility] -eh

## 2014-12-29 NOTE — Telephone Encounter (Signed)
Ok to call in Ambien as ordered  She sees and ENT at [facility] for her tinnitus   Ok to refer to Dr. Cruzita Lederer for for new endocrine MD

## 2015-01-01 ENCOUNTER — Other Ambulatory Visit: Payer: Self-pay | Admitting: Oncology

## 2015-01-01 ENCOUNTER — Telehealth: Payer: Self-pay | Admitting: *Deleted

## 2015-01-01 NOTE — Telephone Encounter (Signed)
This RN returned call to pt per VM left earlier this week.  Theresa [name] states " Dr Jannifer Rodney mentioned assisting with obtaining my reclast injections at his office since my endocrinologist has retired"  Per discussion - [name] was to get reclast in March post bone density showing ongoing osteoporosis secondary to long term steroid use for Wegner's .  " pts are now being sent to the outpatient center at the hospital but that is very expensive because I have to pay a hospital fee and a medication fee "  [name] is hoping to obtain Reclast locally as well as understand actual cost for comparison.  [name] understands request with history will be reviewed and she will be contacted late next week regarding her inquiry.  Last DEXA was 11/12/2014 at [facility] with result in Care Everywhere.  Results obtained- showing readings as   11/12/2014 Spine - -3.2 Femoral neck -2.0 Trochanter -2.2  Prior DEXA 12/2012 Spine -2.8 Femoral neck -2.1 Trochanter -2.3  DEXA 02/2010 Spine -2.1 Femoral neck -1.9  [name] was on fosamax, boniva and actonel from 2002-2011 with continued bone loss.  Post menopausel 2012.  This note will be sent to MD for review and request for orders for managed care to review for authorization and cost concerns.

## 2015-01-05 ENCOUNTER — Telehealth: Payer: Self-pay

## 2015-01-05 NOTE — Telephone Encounter (Signed)
Medical records faxed request to Dr. Ailene Rud office - pt to have [facility] at [facility].  Sent to scan.

## 2015-01-08 ENCOUNTER — Other Ambulatory Visit: Payer: Self-pay | Admitting: *Deleted

## 2015-01-13 ENCOUNTER — Other Ambulatory Visit: Payer: Self-pay | Admitting: Internal Medicine

## 2015-01-13 MED ORDER — LORAZEPAM 1 MG PO TABS: 1.0000 mg | ORAL_TABLET | Freq: Two times a day (BID) | ORAL | Status: DC | PRN

## 2015-01-15 ENCOUNTER — Other Ambulatory Visit: Payer: Self-pay | Admitting: *Deleted

## 2015-01-15 DIAGNOSIS — M3130 Wegener's granulomatosis without renal involvement: Secondary | ICD-10-CM

## 2015-01-15 DIAGNOSIS — M810 Age-related osteoporosis without current pathological fracture: Secondary | ICD-10-CM

## 2015-01-18 ENCOUNTER — Ambulatory Visit (HOSPITAL_BASED_OUTPATIENT_CLINIC_OR_DEPARTMENT_OTHER): Payer: Medicare Other

## 2015-01-18 ENCOUNTER — Telehealth: Payer: Self-pay | Admitting: Oncology

## 2015-01-18 ENCOUNTER — Other Ambulatory Visit (HOSPITAL_BASED_OUTPATIENT_CLINIC_OR_DEPARTMENT_OTHER): Payer: Medicare Other

## 2015-01-18 ENCOUNTER — Ambulatory Visit (HOSPITAL_BASED_OUTPATIENT_CLINIC_OR_DEPARTMENT_OTHER): Payer: Medicare Other | Admitting: Oncology

## 2015-01-18 VITALS — BP 121/66 | HR 60 | Temp 97.8°F | Resp 18 | Ht 66.0 in | Wt 161.6 lb

## 2015-01-18 DIAGNOSIS — M3130 Wegener's granulomatosis without renal involvement: Secondary | ICD-10-CM

## 2015-01-18 DIAGNOSIS — M810 Age-related osteoporosis without current pathological fracture: Secondary | ICD-10-CM | POA: Diagnosis not present

## 2015-01-18 LAB — CBC WITH DIFFERENTIAL/PLATELET
BASO%: 0.6 % (ref 0.0–2.0)
BASOS ABS: 0.1 10*3/uL (ref 0.0–0.1)
EOS%: 1.3 % (ref 0.0–7.0)
Eosinophils Absolute: 0.1 10*3/uL (ref 0.0–0.5)
HEMATOCRIT: 41.9 % (ref 34.8–46.6)
HEMOGLOBIN: 13.9 g/dL (ref 11.6–15.9)
LYMPH%: 30.1 % (ref 14.0–49.7)
MCH: 30.4 pg (ref 25.1–34.0)
MCHC: 33.2 g/dL (ref 31.5–36.0)
MCV: 91.7 fL (ref 79.5–101.0)
MONO#: 0.7 10*3/uL (ref 0.1–0.9)
MONO%: 7.9 % (ref 0.0–14.0)
NEUT#: 4.9 10*3/uL (ref 1.5–6.5)
NEUT%: 60.1 % (ref 38.4–76.8)
Platelets: 308 10*3/uL (ref 145–400)
RBC: 4.57 10*6/uL (ref 3.70–5.45)
RDW: 13.3 % (ref 11.2–14.5)
WBC: 8.2 10*3/uL (ref 3.9–10.3)
lymph#: 2.5 10*3/uL (ref 0.9–3.3)

## 2015-01-18 LAB — COMPREHENSIVE METABOLIC PANEL (CC13)
ALK PHOS: 62 U/L (ref 40–150)
ALT: 20 U/L (ref 0–55)
AST: 17 U/L (ref 5–34)
Albumin: 3.7 g/dL (ref 3.5–5.0)
Anion Gap: 10 mEq/L (ref 3–11)
BILIRUBIN TOTAL: 0.4 mg/dL (ref 0.20–1.20)
BUN: 19.6 mg/dL (ref 7.0–26.0)
CALCIUM: 9.4 mg/dL (ref 8.4–10.4)
CHLORIDE: 110 meq/L — AB (ref 98–109)
CO2: 22 mEq/L (ref 22–29)
Creatinine: 0.8 mg/dL (ref 0.6–1.1)
EGFR: 88 mL/min/{1.73_m2} — ABNORMAL LOW (ref >90)
Glucose: 87 mg/dl (ref 70–140)
Potassium: 3.9 mEq/L (ref 3.5–5.1)
Sodium: 142 mEq/L (ref 136–145)
Total Protein: 6.5 g/dL (ref 6.4–8.3)

## 2015-01-18 MED ORDER — ZOLEDRONIC ACID 4 MG/100ML IV SOLN
4.0000 mg | Freq: Once | INTRAVENOUS | Status: AC
  Administered 2015-01-18: 4 mg via INTRAVENOUS
  Filled 2015-01-18: qty 100

## 2015-01-18 NOTE — Progress Notes (Signed)
ID: Theresa [name]  DOB: 02/27/1961  MR#: [id number]  CSN#: [id number]  PCP: Kelton Pillar, MD GYN: SU: OTHER MD: [name] (fax [phone]); Jaquita Rector; Danton Sewer; Kandis Ban; [name] (fax [phone]); Huey Romans, Murali Ramaswami  History of wegener's granulomatosis:  Theresa [name] tells me her diagnosis of Wegener was initially made at the [facility] by Dr. [name] in 1999.  She has been treated in the past with Cytoxan and prednisone, methotrexate very briefly, Cytoxan orally, then Imuran for some time, and then off treatment for some years.  She was started on prednisone alone in 2009, and then in 06/2008 the azathioprine was added again at 150 mg daily, and variable doses of prednisone.  It is felt that she failed remission maintenance therapy since she could not reduce her dose of prednisone below 20 mg daily without having a resurgence of symptoms, and therefore she has been started on Rituxan with the first dose given at the [facility] on 06/24.  The patient tolerated the treatment quite well.  Her subsequent history is as detailed below  PMHx: Associated with the Wegener, the past medical history is significant for prior tracheostomy, prior sinus surgery x 2, prior right tear duct surgery at [facility] for epiphora (with subsequent re-obstruction).  She also has a history of nasal saddling and inflammation left greater than right.  She also gets occasional thrush problems because of the steroid treatment.  Aside from the Wegener, she has a history of osteoporosis, history of migraines, history of GERD, history of mitral valve prolapse, and a history of MRSA infection remotely (1999).  Interval History:   Theresa [name] a returns for followup of for Wegener's. From that point of view she is doing quite well. She has taken herself off prednisone as of about 10 days ago. She continues to be seen regularly by Dr. [name] and has also established herself with Dr.  Chase Caller locally.  ROS:  She has lost quite a few doctors, which have moved out of town for one reason or another. She would like to receive her zolendronate here. That of course is fine. She complains of problems with chills, ringing in her ears, hoarseness, rare palpitations possibly associated with caffeine, easy bruising, and occasional hot flashes. She is feeling a little bit tired right now, possibly because of going off steroids, although she tapered down to 2.5 mg daily for about 10 days prior to discontinuing that. She continues on Ativan, and is currently taking 2 mg without which she says she cannot sleep. A detailed review of systems today was otherwise stable   Allergies  Allergen Reactions  . Codeine Itching  . Eletriptan Nausea Only  . Vancomycin Hives  . Tape Rash    Current Outpatient Prescriptions  Medication Sig Dispense Refill  . albuterol (PROVENTIL HFA;VENTOLIN HFA) 108 (90 BASE) MCG/ACT inhaler Inhale 2 puffs into the lungs every 6 (six) hours as needed. For shortness of breath or wheezing    . Calcium Carbonate-Vitamin D (CALCIUM 600+D) 600-400 MG-UNIT per tablet Take 1 tablet by mouth daily.    Marland Kitchen esomeprazole (NEXIUM) 40 MG capsule Take 40 mg by mouth daily before breakfast.    . fluticasone (FLONASE) 50 MCG/ACT nasal spray Place 2 sprays into the nose daily.    . fluticasone (FLOVENT HFA) 110 MCG/ACT inhaler Inhale 2 puffs into the lungs daily.    . Hypertonic Nasal Wash (SINUS RINSE BOTTLE KIT NA) Place into the nose.    Marland Kitchen LORazepam (ATIVAN) 1  MG tablet Take 1 tablet (1 mg total) by mouth 2 (two) times daily as needed for anxiety. 15 tablet 0  . Melatonin 5 MG TABS Take by mouth.    . Multiple Vitamin (MULTIVITAMIN) tablet Take 1 tablet by mouth daily.    . Omega-3 Fatty Acids (FISH OIL PO) Take 1 capsule by mouth 3 (three) times daily.    . predniSONE (DELTASONE) 5 MG tablet Take 5 mg by mouth daily.    Marland Kitchen sulfamethoxazole-trimethoprim (BACTRIM DS,SEPTRA DS)  800-160 MG per tablet Take 1 tablet by mouth once.    Marland Kitchen zolpidem (AMBIEN) 10 MG tablet Take one tablet hs prn.  Do not take with Ativan 30 tablet 1   No current facility-administered medications for this visit.    FAMILY HISTORY:  The patient's father is alive in his early 87s.  The patient's mother is alive in her late 68s.  The patient has 3 sisters.  There is no one else in the family with autoimmune disease or with cancer.  GYNECOLOGIC HISTORY:  She is GX, P2, first pregnancy to term age 42.  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  Her former husband, Hilliard Clark, works for the [employer] for [employer], where he is the [employer].  They have a son currently Scientist, physiological and finance at [employer] and a daughter who is a Paramedic in high school at home with the patient.  They attend [employer] here in [place].  HEALTH MAINTENANCE:  She has never had a colonoscopy.  She is not a smoker, and never abused tobacco or alcohol.  She tells me her cholesterol is good.  She has a history of osteopenia. She is up to date on mammography (2013)  Objective: Middle-aged white woman who appears stated age  54 Vitals:   01/18/15 1339  BP: 121/66  Pulse: 60  Temp: 97.8 F (36.6 C)  Resp: 18    BMI: Body mass index is 26.1 kg/(m^2).   ECOG FS: 1  Sclerae unicteric, EOMs intact Oropharynx clear, dentition in good repair No cervical or supraclavicular adenopathy Lungs no rales or rhonchi Heart regular rate and rhythm Abd soft, nontender, positive bowel sounds MSK no focal spinal tenderness, no upper extremity lymphedema Neuro: nonfocal, well oriented, appropriate affect Breasts: Deferred    Lab Results: Review of outside labs March 2016 show vitamin D 25 hydroxy level  Of 37.1. TSH was 1.50. Basic metabolic panel was within normal limits     Chemistry      Component Value  Date/Time   NA 142 01/18/2015 1323   NA 140 08/17/2014 1511   K 3.9 01/18/2015 1323   K 4.5 08/17/2014 1511   CL 106 08/17/2014 1511   CL 106 11/07/2012 0916   CO2 22 01/18/2015 1323   CO2 25 08/17/2014 1511   BUN 19.6 01/18/2015 1323   BUN 14 08/17/2014 1511   CREATININE 0.8 01/18/2015 1323   CREATININE 0.92 08/17/2014 1511   CREATININE 0.93 04/25/2012 1023      Component Value Date/Time   CALCIUM 9.4 01/18/2015 1323   CALCIUM 9.1 08/17/2014 1511   ALKPHOS 62 01/18/2015 1323   ALKPHOS 42 08/17/2014 1511   AST 17 01/18/2015 1323   AST 18 08/17/2014 1511   ALT 20 01/18/2015 1323   ALT 17 08/17/2014 1511   BILITOT 0.40 01/18/2015 1323   BILITOT 0.4 08/17/2014  1511       Lab Results  Component Value Date   WBC 8.2 01/18/2015   HGB 13.9 01/18/2015   HCT 41.9 01/18/2015   MCV 91.7 01/18/2015   PLT 308 01/18/2015   NEUTROABS 4.9 01/18/2015    Studies/Results: No results found.   Assessment: 54 y.o.  [place] woman with a history of Wegener's granulomatosis initially diagnosed in 1999, variously treated as noted above, s/p Rituxan x4 completed July 2010, currently on low-dose prednisone maintenance.  (1) osteoporosis, treated on alendronate between 2002 and 2005, ibandronate between 2005 in 2008, then risedronate between 2008 and 2011, first dose of Reclast 10/24/2013  (a) DEXA scan 12/22/2012 lowest reading -2.8 at the spine, -2.1 of the femoral neck   Plan: Zuzu is doing remarkably well as far as her Wegener's is concerned. She is off prednisone at this time. She is following up with Dr. Lynford Citizen as far as that is concerned.  Slowly she is switching all her physicians to the [facility] system which has the great advantage that we are all in Epic and so communication is a lot easier. I have just referred her to Dr. Doug Sou across the street to be her primary care physician if she agrees.  Maranatha is developing tolerance to benzodiazepines. She understands this phenomenon  quite well. I suggested we try very low dose Effexor and see that helps her taper down. She was reluctant to do this because she had significant problems with antidepressants in the past.   She will receive zolendronate today for her osteoporosis. She will see me again in one year and we will repeat zolendronate at that time. She knows to call for any problems that may develop before her next visit here.         MAGRINAT,GUSTAV C 01/18/2015

## 2015-01-18 NOTE — Patient Instructions (Signed)

## 2015-01-18 NOTE — Telephone Encounter (Signed)
Called and left a massage with her one year appointment and the number for [facility]   anne

## 2015-01-20 ENCOUNTER — Ambulatory Visit: Payer: Self-pay | Admitting: Cardiology

## 2015-01-21 ENCOUNTER — Other Ambulatory Visit: Payer: Self-pay | Admitting: Oncology

## 2015-01-22 ENCOUNTER — Ambulatory Visit (INDEPENDENT_AMBULATORY_CARE_PROVIDER_SITE_OTHER): Payer: Medicare Other | Admitting: Internal Medicine

## 2015-01-22 ENCOUNTER — Encounter: Payer: Self-pay | Admitting: Internal Medicine

## 2015-01-22 ENCOUNTER — Other Ambulatory Visit: Payer: Self-pay | Admitting: Sports Medicine

## 2015-01-22 VITALS — BP 132/74 | HR 68 | Ht 66.0 in | Wt 160.0 lb

## 2015-01-22 DIAGNOSIS — M3130 Wegener's granulomatosis without renal involvement: Secondary | ICD-10-CM

## 2015-01-22 DIAGNOSIS — M79672 Pain in left foot: Secondary | ICD-10-CM

## 2015-01-22 NOTE — Patient Instructions (Addendum)
ICD-9-CM ICD-10-CM   1. Wegener's granulomatosis (granulomatosis with polyangiitis) 446.4 M31.30      #Granulomatous Polyangitis (GPA) disease  - Disease seems under remission based on clinical evaluation and review of [facility] notes - I have emailed my contact at [facility] to disuss with the doctor Dr Renetta Chalk directly  - I will ask question on prednisone - Glad you are doing well  #FOllowup 6 months or sooner if needed  Please call or come sooner if any complaints or concerns

## 2015-01-22 NOTE — Progress Notes (Signed)
Subjective:    Patient ID: Theresa [name], female    DOB: 11-Aug-1961, 54 y.o.   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      54 year old female. 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  CAre direction is from Dr [name] at [facility] in [place], [place]. Main reason she is here she wants local pulmonologist back up  Reports that in  1998 when pregnant with daughter she developed severe headaches and sinusitis. She recollects mulitple inteventions and Rx prior sinus surgery x 2, prior right tear duct surgery at [facility] for epiphora (with subsequent re-obstruction). She also has a history of nasal saddling and inflammation left greater than right.  And biopsies with no diagnosis. In 1999 had upper airway obstruction and underwent emergency trach at [facility] (Dr [name]). Other interventions include:  Resuled in visit to [facility] and Dr [name] making diagnosis of GPA.   She has been treated in the past with Cytoxan and prednisone, methotrexate very briefly, Cytoxan orally, then Imuran for some time, and then off treatment for some years. She was started on prednisone alone in 2009, and then in 06/2008 the azathioprine was added again at 150 mg daily, and variable doses of prednisone. Then in end 2013, early 2013 had relapse and needed rituxan and was felt  She could not go below prednisone $RemoveBeforeDE'20mg'wTPPWmSuJgjxRzE$  per day. She had first dose at [facility] and subsequent 3 weekly  doses with [name] early 2013.  Aside from the Wegener, she has a history of osteopenia,  history of migraines, history of GERD, history of mitral valve prolapse, and a history of MRSA infection remotely (1999).   In 2014: but it appers disease in stable/remission with prednisone and in 2014: she was able to get disability. In May 2015 She followed with [name], disease was felt to be stable. HEr main issues were fatigue, insomnia , anxiety, and mild anxiety. SHe has clarified that overall she has handled lif stressors well and does not suffer from anxiety disorder per se. IF atll anxiety is mild and only situational  She is now referred because she wants a local Rx team. She feels her [facility] eNT team is retiring. And apparently Dr [name] at [facility] is moving towards research + expensive to fly up there. So far Rx decisions have come from [facility] and not from [place] [facility]. She is making a trip up there in August 2015 and will see if she can continue going there but she will likely look for increasing Rx decisions from [place].  From pulm perspective, she prefers [facility] for admissions and prefers pulmonary admit her if she became critically ill. She wants airway support in [place]; we discussed local ENT docs. For now, she wants to hold off establishing someone as OPD. She is aware that if critical, ER and PCCM will coordinate that.  She also wants me to be her resouruce locally  I have agreed to her care plan     Smoking hx  reports that she  has never smoked. She has never used smokeless tobacco.   CXR Oct 2012   RADIOLOGY REPORT*  Clinical Data: Cough for 5 days. Wegener's granulomatosis.  CHEST - 2 VIEW  Comparison: None.  Findings: Emphysema is present with flattening of hemidiaphragms  and enlargement retrosternal clear space. Bronchitic changes are  present in the lower lobes bilaterally. Cardiopericardial  silhouette and mediastinal contours appear within normal limits.  Vertebral body height is preserved. No destructive osseous lesions  are identified. No plain film evidence of  adenopathy.  IMPRESSION:  Hyperinflation consist with emphysema. No active cardiopulmonary  disease.  Original Report Authenticated By: Dereck Ligas, M.D.    [place] 06/18/2014  Chief Complaint  Patient presents with  . Follow-up    Pt state since last OV pt states she is feeling better. Pt states she is going to [facility] in November. Pt denies SOB, cough and CP/tightness.     Followup GPA   - She has postponed trip to [facility] due to father and father in law being sick with cancer. She did have soe headache, MRI Was normal per hx and headaches attributed to ativan weaning (she is tyrin to get herself off ativan which she has been on for decades). Shee feels wegner under remission. No hematuria or respiratory issues. Will defer labs to [facility] visit. Recent PR-3 aparently up a point. She has not had PREVNAR and will have it 06/18/2014   She did clarify to me that plan in case of airway emergency is NOT for [place] as indicated in visit June 2015 but [place] where she has new team (pripr docs have retired)     Past: She is trying to wean herself off Espy 01/22/2015  Chief Complaint  Patient presents with  . Follow-up    [facility] with [facility]. Pt stated her breathing is doing well. Pt c/o mild prod cough with clear mucus. Pt denies CP/tightness.     Follow-up GPA  Last seen October 2015. After that she went for follow-up to [facility] in [place], [place]. Overall she's doing well. No respiratory issues. Occasionally there is some voice change. This is due to subglottic stenosis. [facility]: Nov 2015 patient is concerned about the fact her B cells are low. She wants to know what this means. I will try to get in touch with  [facility]  Drs [name] attending or Dr Regan Lemming ? Fellow to answer this questionENT at [facility] evaluated her noted to have minimal erythema at right nare but stable subglottic area without inflammation ENT at [facility]  evaluated her noted to have minimal erythema at right nare but stable subglottic area without inflammation. At that visit Nov 2015 - per notes  B Cell pending but PR-3 ANCA is 0.2.  Ovverall disease GPA was felt to be in remission  Their recommendaton 1) . She also asked them about coming off prednisone. They did not that she has had prednisoen burst 4-5 ties between her visit with them April 2014 and Nov 2015 with last burst in may 2015. They have maintained equipose in recommending if she can come off prednisone but are ok wit her trying to come off which she has done at this point. 2)  monitor Pr-3 Q3 months 3) continue bactrim 1 tab daily ( I do not see it in her [facility] today may 2016 visit)   [facility]10[facility]2014 notes reviewed and things I learned are  - GPA diagnosed June  1998 due ing 2nd prednancy.   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  - July 1998 sinus bx at [facility] - nonspecific  - Aug 1998 - left parotid bx - non specific  - Oct 1999 - c-ANCA now postive - Rx 5 weeeks  - Nov 1999 - acut closure of subglottis - Rx Cytoxan, [facility] and laser  - Dec 1999 - [facility] Appt with Dr Adan Sis Rx cytoxan and then later imuran  - Late 2002 - stop imuran and after that occ prd burst adn regular bactrim  - 2009 - recurrence with ENT symptoms: restart immuran, continue bactrim   -July 2010 - Flare up with significant nasal and subglottic disease despite high dose immuran. s/p induction rituxan with subsequent slow to reconstitute and maintained on prednisone $RemoveBefor'5mg'UIPKZsgdYrIS$  daily which seems to control her symptoms  And since then ANCA- negative  - April 2013: [place] 1 but ANCA negative (was positive prior to rituxan) - April 2014: Last seen by Dr Adan Sis prior to Nov 2015 visit. PR-3 anca negative this time - Rx Bactrim 1DS BID. Risk for relapse considered low  Past medical  history since I las saw her - Got her second reclast  infusion recently and apparently this infusion was too fast and she had some side effects and intolerance - She has tennis elbow on right side  - She also has a left fourth toe bruising/fracture being managed conservatively - tried to wean off ativan (chronic) but did not work   Current outpatient prescriptions:  .  albuterol (PROVENTIL HFA;VENTOLIN HFA) 108 (90 BASE) MCG/ACT inhaler, Inhale 2 puffs into the lungs every 6 (six) hours as needed. For shortness of breath or wheezing, Disp: , Rfl:  .  Calcium Carbonate-Vitamin D (CALCIUM 600+D) 600-400 MG-UNIT per tablet, Take 1 tablet by mouth daily., Disp: , Rfl:  .  esomeprazole (NEXIUM) 40 MG capsule, Take 40 mg by mouth daily as needed. , Disp: , Rfl:  .  fluticasone (FLONASE) 50 MCG/ACT nasal spray, Place 2 sprays into the nose daily., Disp: , Rfl:  .  fluticasone (FLOVENT HFA) 110 MCG/ACT inhaler, Inhale 2 puffs into the lungs daily., Disp: , Rfl:  .  Hypertonic Nasal Wash (SINUS RINSE BOTTLE KIT NA), Place into the nose., Disp: , Rfl:  .  LORazepam (ATIVAN) 1 MG tablet, Take 1 tablet (1 mg total) by mouth 2 (two) times daily as needed for anxiety. (Patient taking differently: Take 2 mg by mouth at bedtime. ), Disp: 15 tablet, Rfl: 0 .  Melatonin 5 MG TABS, Take by mouth., Disp: , Rfl:  .  Multiple Vitamin (MULTIVITAMIN) tablet, Take 1 tablet by mouth daily., Disp: , Rfl:  .  zolpidem (AMBIEN) 10 MG tablet, Take one tablet hs prn.  Do not take with Ativan, Disp: 30 tablet, Rfl: 1   Review of Systems  Constitutional: Negative for fever and unexpected weight change.  HENT: Negative for congestion, dental problem, ear pain, nosebleeds, postnasal drip, rhinorrhea, sinus pressure, sneezing, sore throat and trouble swallowing.   Eyes: Negative for redness and itching.  Respiratory: Positive for cough and shortness of breath. Negative for chest tightness and wheezing.   Cardiovascular:  Negative for palpitations and leg swelling.  Gastrointestinal: Negative for nausea and vomiting.  Genitourinary: Negative for dysuria.  Musculoskeletal: Negative for joint swelling.  Skin: Negative for rash.  Neurological: Negative for headaches.  Hematological: Does not bruise/bleed easily.  Psychiatric/Behavioral: Negative for dysphoric mood. The patient is not nervous/anxious.        Objective:   Physical Exam  Constitutional: She is oriented to person, place, and time. She appears well-developed and well-nourished. No distress.  HENT:  Head: Normocephalic and atraumatic.  Right Ear: External ear normal.  Left Ear: External ear normal.  Mouth/Throat: Oropharynx is clear and moist. No oropharyngeal exudate.  Scar of prior trach  Eyes: Conjunctivae and EOM are normal. Pupils are equal, round, and reactive to light. Right eye exhibits no discharge. Left eye exhibits no discharge. No scleral icterus.  Neck: Normal range of motion. Neck supple. No JVD present. No tracheal deviation present. No thyromegaly present.  Cardiovascular: Normal rate, regular rhythm, normal heart sounds and intact distal pulses.  Exam reveals no gallop and no friction rub.   No murmur heard. Pulmonary/Chest: Effort normal and breath sounds normal. No respiratory distress. She has no wheezes. She has no rales. She exhibits no tenderness.  Abdominal: Soft. Bowel sounds are normal. She exhibits no distension and no mass. There is no tenderness. There is no rebound and no guarding.  Musculoskeletal: Normal range of motion. She exhibits no edema or tenderness.  Bruising on left forearm from reclast Rt elbow strap due to tennis elbow + And small bruise on left 4th toe  Lymphadenopathy:    She has no cervical adenopathy.  Neurological: She is alert and oriented to person, place, and time. She has normal reflexes. No cranial nerve deficit. She exhibits normal muscle tone. Coordination normal.  Skin: Skin is warm and  dry. No rash noted. She is not diaphoretic. No erythema. No pallor.  Psychiatric: She has a normal mood and affect. Her behavior is normal. Judgment and thought content normal.  Vitals reviewed.   Filed Vitals:   01/22/15 1457  BP: 132/74  Pulse: 68  Height: $Remove'5\' 6"'dkjmQHv$  (1.676 m)  Weight: 160 lb (72.576 kg)  SpO2: 98%         Assessment & Plan:  #Granulomatous Polyangitis (GPA) disease  - Disease seems under remission based on clinical evaluation and [facility] - I have emailed my contact at [facility] to disuss with the doctor Dr Renetta Chalk directly  - I will ask question on prednisone but till then taper ok  - Glad you are doing well  #FOllowup 6 months or sooner if needed  Please call or come sooner if any complaints or concerns   (> 50% of this 15 min visit spent in face to face counseling or/and coordination of care)   Dr. Brand Males, M.D., [facility].C.P Pulmonary and Critical Care Medicine Staff Physician [place] [facility] Pager: [phone], If no answer or between  15:00h - 7:00h: call [phone]  01/24/2015 4:56 PM

## 2015-01-24 ENCOUNTER — Telehealth: Payer: Self-pay | Admitting: Internal Medicine

## 2015-01-24 DIAGNOSIS — M3130 Wegener's granulomatosis without renal involvement: Secondary | ICD-10-CM | POA: Insufficient documentation

## 2015-01-24 HISTORY — DX: Wegener's granulomatosis without renal involvement: M31.30

## 2015-01-24 NOTE — Telephone Encounter (Signed)
Theresa [name]  LEt patient know I Reviewed [facility] records in detail  1) ask her when her lst dose of prednisone was? 2) they recommended continuation of bactrim but I do not see it in office MAR, please find out? 3) fax number so you can send my note to [facility] 4)  I have not heard from my contact in [place] so I Can talk to her [place] doctors - let her know 5) [facility] recommended q3 month monitoring of her ANCA antibodies - is she getting it done. If so where? How?  Thanks  MR

## 2015-01-25 NOTE — Telephone Encounter (Signed)
Spoke with patient : 1) Pred started in 1998/1999 - was off of it for about 1 year in the last 18 years but has been consistently on it since beginning.  2) Pt was started in Bactrim in 1999 - has been on this daily since then at $Remov'400mg'RbALIV$  daily. Pt was on $Remo'800mg'egJYr$  but decreased to $RemoveBefo'400mg'vQcJWEhRUJV$ . Pt was told this keeps her PNA at [place]. 3) Fax # for [facility], [phone] 4) Pt was okay with you not being able to reach anyone yet to discuss care.  5) ANCA is being managed by [name] at the [facility]. Pt receives a lab kit in them mail, takes it to [facility] and once this is drawn the kit is mailed back off.  Will send to Dr Chase Caller as Juluis Rainier.

## 2015-01-25 NOTE — Telephone Encounter (Signed)
lmtcb for pt.  

## 2015-01-26 ENCOUNTER — Ambulatory Visit
Admission: RE | Admit: 2015-01-26 | Discharge: 2015-01-26 | Disposition: A | Discharge status: Home / Self Care | Payer: Medicare Other | Source: Ambulatory Visit | Attending: Sports Medicine | Admitting: Sports Medicine

## 2015-01-26 ENCOUNTER — Telehealth: Payer: Self-pay | Admitting: Internal Medicine

## 2015-01-26 DIAGNOSIS — M79672 Pain in left foot: Secondary | ICD-10-CM

## 2015-01-26 NOTE — Telephone Encounter (Signed)
New Message  This message is sent to inform you that we have made several attempts to schedule an appt with this pt. We were able to schedule an appt for 05/18 with Dr. Stanford Breed in [place]. We found that the patient has called back to cancel and request to call back at a later time to reschedule.  We have removed the pt from out referral work queue.  We simply wanted you and your staff to be aware.  Thank you  [facility]

## 2015-01-28 ENCOUNTER — Other Ambulatory Visit: Payer: Self-pay | Admitting: Vascular Surgery

## 2015-02-04 ENCOUNTER — Ambulatory Visit: Payer: Self-pay | Admitting: Vascular Surgery

## 2015-02-04 ENCOUNTER — Encounter (HOSPITAL_COMMUNITY): Payer: Self-pay

## 2015-02-10 ENCOUNTER — Telehealth: Payer: Self-pay | Admitting: Internal Medicine

## 2015-02-10 NOTE — Telephone Encounter (Signed)
Let patient know that finaly I got hold of Dr Judie Grieve at [facility] who will have Dr Tiburcio Pea contact me before she leaves [facility] for good end of the month

## 2015-02-10 NOTE — Telephone Encounter (Signed)
See phone note 01/24/15.  Have already lm for pt to call.  Will close this message

## 2015-02-10 NOTE — Telephone Encounter (Signed)
  Theresa [name]  Please let [name] know that I spoke with [facility] docs and all on same page/understanding  Dr. Brand Males, M.D., [facility].C.P Pulmonary and Critical Care Medicine Staff Physician [place] [facility] Pager: [phone], If no answer or between  15:00h - 7:00h: call [phone]  02/10/2015 10:44 AM       ....................................... D.w Dr Judee Clara of [place] who saw patient nov 2015 with Dr [name]  - long standing ENT involvement of GPA  - last rituxan 2010 during flare up  - currently in remission  - they monitor B Cells with rituxan which gets depleted by Rituxan - in Nov 2015 - B cells back which means rituxan wore off which  Puts her at risk fo flare. So, they monitor ANCA levels every 3 months by sending kits to [place]  [name] (PR-3 esp ) for recurrence of disease. In may 2016 (last month) it was negative. Their vaculitis nurse mails the kit q3 months; next in august 2016.    - Prednisone can be tapered off ideally when given on Rituxan but in [place] give her long standing pred use - she was advised to self titrate watching for adrenal symptom  - patient has been good about calling about [facility] for any issues     Thanks  Dr. Brand Males, M.D., [facility].C.P Pulmonary and Critical Care Medicine Staff Physician [place] [facility] Pager: [phone], If no answer or between  15:00h - 7:00h: call [phone]  02/10/2015 10:42 AM

## 2015-02-10 NOTE — Telephone Encounter (Signed)
LMTC x 1  

## 2015-02-11 NOTE — Telephone Encounter (Signed)
lmtcb x1 

## 2015-02-11 NOTE — Telephone Encounter (Signed)
Pt returning call.Theresa [name] ° °

## 2015-02-12 NOTE — Telephone Encounter (Signed)
Called and spoke to pt. Informed her of the recs per MR. Pt verbalized understanding and denied any further questions or concerns at this time.   

## 2015-02-15 ENCOUNTER — Other Ambulatory Visit: Payer: Self-pay | Admitting: *Deleted

## 2015-02-15 MED ORDER — LORAZEPAM 1 MG PO TABS: 1.0000 mg | ORAL_TABLET | Freq: Two times a day (BID) | ORAL | Status: AC | PRN

## 2015-04-19 ENCOUNTER — Telehealth: Payer: Self-pay | Admitting: Oncology

## 2015-04-19 ENCOUNTER — Other Ambulatory Visit: Payer: Self-pay | Admitting: *Deleted

## 2015-04-19 NOTE — Telephone Encounter (Signed)
Confirmed appointment for labs 08/16. Patient will verify all other labs on MyChart

## 2015-04-20 ENCOUNTER — Other Ambulatory Visit: Payer: Medicare Other

## 2015-05-07 ENCOUNTER — Other Ambulatory Visit: Payer: Self-pay | Admitting: *Deleted

## 2015-05-07 DIAGNOSIS — I83893 Varicose veins of bilateral lower extremities with other complications: Secondary | ICD-10-CM

## 2015-05-26 ENCOUNTER — Encounter (INDEPENDENT_AMBULATORY_CARE_PROVIDER_SITE_OTHER): Payer: Medicare Other

## 2015-05-26 DIAGNOSIS — I868 Varicose veins of other specified sites: Secondary | ICD-10-CM

## 2015-05-27 ENCOUNTER — Telehealth: Payer: Self-pay | Admitting: Vascular Surgery

## 2015-05-27 ENCOUNTER — Telehealth: Payer: Self-pay | Admitting: *Deleted

## 2015-05-27 NOTE — Telephone Encounter (Signed)
Spoke with Ms. Raynald Kemp and informed her that Curt Jews M.D. had spoken (via telephone) with [name], M.D. this morning regarding if antibiotics or steroid push were needed pre-op or post-op endovenous laser ablation procedure scheduled for 06-03-2015 to be preformed by Curt Jews M.D.     [name], M.D. [facility] physician that has managed Theresa [name]'s medical care for Wegner's syndrome) stated to Dr. Donnetta Hutching that neither antibiotics nor steroid push were needed pre or post endovenous laser procedure.  Notified Theresa [name] of this discussion and of Dr. [name] recommendation.  Theresa [name] verbalized understanding.

## 2015-05-27 NOTE — Telephone Encounter (Signed)
I spoke with Dr.Ulrich Specks at the [facility] regarding the planned upcoming procedure of staged bilateral laser ablation and stab phlebectomy on 06/03/2015 and 06/24/2015. Explained that this was under local anesthesia only lasting approximately 1-1/2 hours in the procedure involved with laser ablation and stab phlebectomy. The patient was concerned that she may need coverage with antibodies and potentially a steroid-dependent boost. I had discussed this with Dr. [name], or local hematologist who did not feel that this was necessary but suggested I speak with her specialist at the [facility]. Dr. [name] did not feel that this put her any increase for bacterial infection and therefore did not recommend antibodies. Also did not feel that she needed any additional stress dose of sterile age for this outpatient procedure. We will communicate this with the patient.

## 2015-06-01 ENCOUNTER — Telehealth: Payer: Self-pay | Admitting: *Deleted

## 2015-06-01 ENCOUNTER — Encounter: Payer: Self-pay | Admitting: Vascular Surgery

## 2015-06-01 NOTE — Telephone Encounter (Signed)
Returning Theresa [name] regarding her earlier telephone message regarding her history of mitral valve prolapse and her question of the need for antibiotics and her past history of MRSA.  Was not able to talk with her directly so left detailed phone voice message.  Talked with Dr. Donnetta Hutching this morning and reviewed her medical history with him and he stated there was no need for antibiotics pre- or post -procedure (endovenous laser ablation and stab phlebectomy scheduled for 06-03-2015).  Relayed Dr. Luther Parody decision to her (regarding no need for antibiotics)  via voice message.  Asked that she call me back if she had questions or concerns.

## 2015-06-03 ENCOUNTER — Encounter: Payer: Self-pay | Admitting: Vascular Surgery

## 2015-06-03 ENCOUNTER — Ambulatory Visit (INDEPENDENT_AMBULATORY_CARE_PROVIDER_SITE_OTHER): Payer: Medicare Other | Admitting: Vascular Surgery

## 2015-06-03 VITALS — BP 108/75 | HR 64 | Temp 97.6°F | Resp 18 | Ht 66.5 in | Wt 158.0 lb

## 2015-06-03 DIAGNOSIS — I83893 Varicose veins of bilateral lower extremities with other complications: Secondary | ICD-10-CM

## 2015-06-03 HISTORY — PX: ENDOVENOUS ABLATION SAPHENOUS VEIN W/ LASER: SUR449

## 2015-06-03 NOTE — Progress Notes (Signed)
     Laser Ablation Procedure    Date: 06/03/2015   Theresa [name] DOB:1961-02-19  Consent signed: Yes    Surgeon:  Dr. Sherren Mocha Early  Procedure: Laser Ablation: left Greater Saphenous Vein  BP 108/75 mmHg  Pulse 64  Temp(Src) 97.6 F (36.4 C) (Oral)  Resp 18  Ht 5' 6.5" (1.689 m)  Wt 158 lb (71.668 kg)  BMI 25.12 kg/m2  SpO2 98%  Tumescent Anesthesia: 475 cc 0.9% NaCl with 50 cc Carbocaine 2% and 15 cc 8.4% NaHCO3  Local Anesthesia: 3 cc Carbocaine 2% and NaHCO3 (ratio 2:1)  15 watts continuous mode        Total energy: 2050 Joules   Total time: 2:16    Stab Phlebectomy: 10-20 Sites: Thigh and Calf  (left leg)  Patient tolerated procedure well  Notes: Carbocaine 2% used in tumescent and local anesthesia as pt. stated Lidocaine has not been effective for her in the past.  Pt. took Xanax 0.5 mg at 7:59AM prior to procedure.     Description of Procedure:  After marking the course of the secondary varicosities, the patient was placed on the operating table in the supine position, and the left leg was prepped and draped in sterile fashion.   Local anesthetic was administered and under ultrasound guidance the saphenous vein was accessed with a micro needle and guide wire; then the mirco puncture sheath was placed.  A guide wire was inserted saphenofemoral junction , followed by a 5 french sheath.  The position of the sheath and then the laser fiber below the junction was confirmed using the ultrasound.  Tumescent anesthesia was administered along the course of the saphenous vein using ultrasound guidance. The patient was placed in Trendelenburg position and protective laser glasses were placed on patient and staff, and the laser was fired at 15 watts continuous mode advancing 1-61mm/second for a total of 2050 joules.   For stab phlebectomies, local anesthetic was administered at the previously marked varicosities, and tumescent anesthesia was administered around the vessels.  Ten  to 20 stab wounds were made using the tip of an 11 blade. And using the vein hook, the phlebectomies were performed using a hemostat to avulse the varicosities.  Adequate hemostasis was achieved.     Steri strips were applied to the stab wounds and ABD pads and thigh high compression stockings were applied.  Ace wrap bandages were applied over the phlebectomy sites and at the top of the saphenofemoral junction. Blood loss was less than 15 cc.  The patient ambulated out of the operating room having tolerated the procedure well.

## 2015-06-03 NOTE — Progress Notes (Signed)
     Laser Ablation Procedure    Date: 06/03/2015   Theresa [name] DOB:1961-04-22  Consent signed: Yes    Surgeon:  Dr. Sherren Mocha Early  Procedure: Laser Ablation: left Greater Saphenous Vein  BP 108/75 mmHg  Pulse 64  Temp(Src) 97.6 F (36.4 C) (Oral)  Resp 18  Ht 5' 6.5" (1.689 m)  Wt 158 lb (71.668 kg)  BMI 25.12 kg/m2  SpO2 98%  Tumescent Anesthesia: 475 cc 0.9% NaCl with 50 cc Carbocaine 2% and 15 cc 8.4% NaHCO3  Local Anesthesia: 3 cc Lidocaine HCL and NaHCO3 (ratio 2:1)  15 watts continuous mode        Total energy: 2050 Joules   Total time: 2:16    Stab Phlebectomy: 10-20 incisions Sites: Thigh and Calf  (left leg)  Patient tolerated procedure well  Notes: Carbocaine 2% used in tumescent and local anesthesia as patient stated Lidocaine has not been effective for her in the past.  Pt. Took Xanax 0.5 mg at 7:59AM prior to procedure.   Description of Procedure:  After marking the course of the secondary varicosities, the patient was placed on the operating table in the supine position, and the left leg was prepped and draped in sterile fashion.   Local anesthetic was administered and under ultrasound guidance the saphenous vein was accessed with a micro needle and guide wire; then the mirco puncture sheath was placed.  A guide wire was inserted saphenofemoral junction , followed by a 5 french sheath.  The position of the sheath and then the laser fiber below the junction was confirmed using the ultrasound.  Tumescent anesthesia was administered along the course of the saphenous vein using ultrasound guidance. The patient was placed in Trendelenburg position and protective laser glasses were placed on patient and staff, and the laser was fired at 15 watts continuous mode advancing 1-21mm/second for a total of 2050 joules.   For stab phlebectomies, local anesthetic was administered at the previously marked varicosities, and tumescent anesthesia was administered around the  vessels.  Ten to 20 stab wounds were made using the tip of an 11 blade. And using the vein hook, the phlebectomies were performed using a hemostat to avulse the varicosities.  Adequate hemostasis was achieved.     Steri strips were applied to the stab wounds and ABD pads and thigh high compression stockings were applied.  Ace wrap bandages were applied over the phlebectomy sites and at the top of the saphenofemoral junction. Blood loss was less than 15 cc.  The patient ambulated out of the operating room having tolerated the procedure well.  Laser ablation from just above the knee to just below the saphenofemoral junction

## 2015-06-04 ENCOUNTER — Telehealth: Payer: Self-pay | Admitting: *Deleted

## 2015-06-04 NOTE — Telephone Encounter (Signed)
    06/04/2015  Time: 9:41 AM   Patient Name: Theresa [name]  Patient of: T.F. Early  Procedure:Laser Ablation left greater saphenous vein and stab phlebectomy 10-20 incisions left leg 06-03-2015    Reached patient at home and checked  Her status  Yes    Comments/Actions Taken: Theresa [name] states her left inner thigh is sore especially at the insertion site.  She is using an ice compress to the area and taking Ibuprofen as directed.  Reviewed all post procedural instructions with her and reminded her of post laser ablation duplex and [facility] follow up appointment with Dr. Donnetta Hutching on 06-10-2015.      @SIGNATURE @

## 2015-06-07 ENCOUNTER — Telehealth: Payer: Self-pay | Admitting: *Deleted

## 2015-06-07 NOTE — Telephone Encounter (Signed)
Pt expressed concern over bruising on her leg and I reassured her that what she is experiencing is normal and to use heat as well as Ibuprofen. She was relieved and we will follow prn.

## 2015-06-08 ENCOUNTER — Encounter: Payer: Self-pay | Admitting: Vascular Surgery

## 2015-06-10 ENCOUNTER — Ambulatory Visit (HOSPITAL_COMMUNITY)
Admission: RE | Admit: 2015-06-10 | Discharge: 2015-06-10 | Disposition: A | Discharge status: Home / Self Care | Payer: Medicare Other | Source: Ambulatory Visit | Attending: Vascular Surgery | Admitting: Vascular Surgery

## 2015-06-10 ENCOUNTER — Ambulatory Visit (INDEPENDENT_AMBULATORY_CARE_PROVIDER_SITE_OTHER): Payer: Medicare Other | Admitting: Vascular Surgery

## 2015-06-10 ENCOUNTER — Encounter: Payer: Self-pay | Admitting: Vascular Surgery

## 2015-06-10 VITALS — BP 108/72 | HR 74 | Temp 97.2°F | Resp 14 | Ht 66.5 in | Wt 155.0 lb

## 2015-06-10 DIAGNOSIS — I83893 Varicose veins of bilateral lower extremities with other complications: Secondary | ICD-10-CM | POA: Diagnosis present

## 2015-06-10 NOTE — Progress Notes (Signed)
Patient presents today for follow-up of her ablation of her left great saphenous vein and stab phlebectomy of multiple tributary varicosities 1 week ago. She had the usual amount of soreness following the procedure. She has been compliant with her compression garment.  On physical exam she has typical amount of bruising around the phlebectomy sites and her medial calf and medial thigh. No evidence of skin irritation from her compression.  Duplex today reveals closure of her great saphenous vein from proximal calf to 0.87 cm from the saphenofemoral junction. There is no evidence of DVT  Impression and plan successful ablation of left great saphenous vein reflux stab phlebectomy of multiple tributary varicosities. She will continue her compression garment for one additional week. She is scheduled for right leg similar treatment on 06/24/2015

## 2015-06-15 ENCOUNTER — Telehealth: Payer: Self-pay | Admitting: *Deleted

## 2015-06-15 NOTE — Telephone Encounter (Signed)
Returning [name]'s earlier phone message about specific questions she had.  Left detailed phone message answering her specific questions.  1. Regarding steri strips, I told her she could remove them anytime and best to remove them when she was wet/damp as when coming out of shower or tub.  2.  Regarding therapeutic massage (recommended by a physician to help her tinnitus), I recommended that the massage not include her legs/anything below the waist.  3.  Regarding the small "lump" in her left upper thigh, I explained the "lump" was most probably blood trapped underneath the skin that would resolve over time.  Asked that Theresa [name] to call me if she had further questions or concerns.

## 2015-06-22 ENCOUNTER — Encounter: Payer: Self-pay | Admitting: Vascular Surgery

## 2015-06-23 ENCOUNTER — Encounter: Payer: Self-pay | Admitting: Vascular Surgery

## 2015-06-24 ENCOUNTER — Encounter: Payer: Self-pay | Admitting: Vascular Surgery

## 2015-06-24 ENCOUNTER — Ambulatory Visit (INDEPENDENT_AMBULATORY_CARE_PROVIDER_SITE_OTHER): Payer: Medicare Other | Admitting: Vascular Surgery

## 2015-06-24 VITALS — BP 100/68 | HR 68 | Temp 97.3°F | Resp 16 | Ht 66.5 in | Wt 158.0 lb

## 2015-06-24 DIAGNOSIS — I83893 Varicose veins of bilateral lower extremities with other complications: Secondary | ICD-10-CM | POA: Diagnosis not present

## 2015-06-24 DIAGNOSIS — I868 Varicose veins of other specified sites: Secondary | ICD-10-CM

## 2015-06-24 HISTORY — PX: ENDOVENOUS ABLATION SAPHENOUS VEIN W/ LASER: SUR449

## 2015-06-24 NOTE — Progress Notes (Signed)
     Laser Ablation Procedure    Date: 06/24/2015   Theresa [name] DOB:1960-11-09  Consent signed: Yes    Surgeon:  Dr. Sherren Mocha Jakeira Seeman  Procedure: Laser Ablation: right Greater Saphenous Vein  BP 100/68 mmHg  Pulse 68  Temp(Src) 97.3 F (36.3 C) (Oral)  Resp 16  Ht 5' 6.5" (1.689 m)  Wt 158 lb (71.668 kg)  BMI 25.12 kg/m2  SpO2 99%  Tumescent Anesthesia: 500 cc 0.9% NaCl with 50 cc Carbocaine 2% and 15 cc 8.4% NaHCO3  Local Anesthesia: 5 cc Carbocaine 2%  and NaHCO3 (ratio 2:1)  15 watts continuous mode        Total energy: 3416 JOULES   Total time: 3 :47    Stab Phlebectomy: 10-20 incisions Sites: Thigh and Calf   Right leg  Patient tolerated procedure well  Notes: Carbocaine 2% used in tumescent and local anesthesia as patient states Lidocaine has not been effective for her in the past.  Patient took Xanax 1.0 mg at 7:50AM and was driven to office by her friend.    Description of Procedure:  After marking the course of the secondary varicosities, the patient was placed on the operating table in the supine position, and the right leg was prepped and draped in sterile fashion.   Local anesthetic was administered and under ultrasound guidance the saphenous vein was accessed with a micro needle and guide wire; then the mirco puncture sheath was placed.  A guide wire was inserted saphenofemoral junction , followed by a 5 french sheath.  The position of the sheath and then the laser fiber below the junction was confirmed using the ultrasound.  Tumescent anesthesia was administered along the course of the saphenous vein using ultrasound guidance. The patient was placed in Trendelenburg position and protective laser glasses were placed on patient and staff, and the laser was fired at 15 watts continuous mode advancing 1-42mm/second for a total of 3416 joules.   For stab phlebectomies, local anesthetic was administered at the previously marked varicosities, and tumescent anesthesia  was administered around the vessels.  Ten to 20 stab wounds were made using the tip of an 11 blade. And using the vein hook, the phlebectomies were performed using a hemostat to avulse the varicosities.  Adequate hemostasis was achieved.     Steri strips were applied to the stab wounds and ABD pads and thigh high compression stockings were applied.  Ace wrap bandages were applied over the phlebectomy sites and at the top of the saphenofemoral junction. Blood loss was less than 15 cc.  The patient ambulated out of the operating room having tolerated the procedure well.

## 2015-06-25 ENCOUNTER — Telehealth: Payer: Self-pay | Admitting: *Deleted

## 2015-06-25 ENCOUNTER — Encounter: Payer: Self-pay | Admitting: Vascular Surgery

## 2015-06-25 NOTE — Telephone Encounter (Signed)
    06/25/2015  Time: 9:16 AM   Patient Name: Theresa [name]  Patient of: T.F. Early  Procedure:Laser Ablation right greater saphenous vein and stab phlebectomy 10-20 incisions right leg 06/24/2015   Reached patient at home and checked  Her status  Yes    Comments/Actions Taken: Theresa [name] states her upper right thigh and knee are sore but the "pain is less than the first procedure."  No c/o right leg bleeding/oozing.  Theresa [name] states the top of her right foot is slightly swollen but she stated she was on her feet frequently yesterday as it was a busy day. Encouraged her to keep right leg elevated at least at the level of her hip or higher.  Reviewed postprocedural instructions with her and reminded her of post laser ablation duplex and [facility] follow up appointment with Dr. Donnetta Hutching on 06-29-2015.       @SIGNATURE @

## 2015-06-25 NOTE — Telephone Encounter (Signed)
Theresa [name] states that she forgot to mention to me in our recent telephone conversation this morning that she woke up several times during the night with her right leg feeling "asleep".  She states that this morning with her leg elevated her right leg occasionally feels numb and she will feel "pins and needles in my right leg."  Encouraged her to move toes and do ankle circles and to carefully get up and walk to improve circulation.   Encouraged her to call [facility] if symptoms worsened or if she had further questions or concerns.

## 2015-06-28 ENCOUNTER — Encounter: Payer: Self-pay | Admitting: Vascular Surgery

## 2015-06-29 ENCOUNTER — Encounter: Payer: Self-pay | Admitting: Vascular Surgery

## 2015-06-29 ENCOUNTER — Ambulatory Visit (INDEPENDENT_AMBULATORY_CARE_PROVIDER_SITE_OTHER): Payer: Medicare Other | Admitting: Vascular Surgery

## 2015-06-29 ENCOUNTER — Telehealth: Payer: Self-pay | Admitting: Oncology

## 2015-06-29 ENCOUNTER — Other Ambulatory Visit: Payer: Self-pay | Admitting: *Deleted

## 2015-06-29 ENCOUNTER — Ambulatory Visit (HOSPITAL_COMMUNITY)
Admission: RE | Admit: 2015-06-29 | Discharge: 2015-06-29 | Disposition: A | Discharge status: Home / Self Care | Payer: Medicare Other | Source: Ambulatory Visit | Attending: Vascular Surgery | Admitting: Vascular Surgery

## 2015-06-29 VITALS — BP 112/77 | HR 64 | Temp 97.2°F | Resp 16 | Ht 66.5 in | Wt 158.0 lb

## 2015-06-29 DIAGNOSIS — Z48812 Encounter for surgical aftercare following surgery on the circulatory system: Secondary | ICD-10-CM | POA: Insufficient documentation

## 2015-06-29 DIAGNOSIS — I83893 Varicose veins of bilateral lower extremities with other complications: Secondary | ICD-10-CM

## 2015-06-29 NOTE — Progress Notes (Signed)
Patient presents today for follow-up of her staged bilateral great saphenous vein ablation and stab phlebectomy of multiple tributary varicosities. Most recent treatment was one week ago to her right leg. She has done well and has been compliant with her compression garments.  On physical exam she has minimal bruising associated with this in the medial thigh. The stab phlebectomy sites all appear to be healing quite well. Her left leg bruising is totally resolved and the subcutaneous thrombus is resolving as well.   Venous duplex today of her right leg reveals closure of the saphenous vein from the distal insertion port in mid calf to 9 mm from the saphenofemoral junction with no evidence of DVT  Impression and plan stable status post staged bilateral laser ablation of great saphenous vein and stab phlebectomy. We'll continue wearing compression garments one additional week in her right leg and then on as-needed basis bilaterally. She will see [place] again on as-needed basis

## 2015-06-29 NOTE — Telephone Encounter (Signed)
Appointments rescheduled per pof and left a message with new lab

## 2015-06-30 ENCOUNTER — Telehealth: Payer: Self-pay | Admitting: Oncology

## 2015-06-30 NOTE — Telephone Encounter (Signed)
Returned patients call to move her lab back to 11/15,done

## 2015-07-13 ENCOUNTER — Other Ambulatory Visit: Payer: Self-pay

## 2015-07-14 ENCOUNTER — Encounter: Payer: Self-pay | Admitting: Family Medicine

## 2015-07-19 ENCOUNTER — Telehealth: Payer: Self-pay

## 2015-07-19 ENCOUNTER — Telehealth: Payer: Self-pay | Admitting: Oncology

## 2015-07-19 ENCOUNTER — Other Ambulatory Visit: Payer: Self-pay

## 2015-07-19 DIAGNOSIS — M3130 Wegener's granulomatosis without renal involvement: Secondary | ICD-10-CM

## 2015-07-19 NOTE — Telephone Encounter (Signed)
Patient requesting flu injection tomorrow after lab.  POF placed.  LVM to let patient know.

## 2015-07-19 NOTE — Telephone Encounter (Signed)
Added inj for 11/15 after lab. Spoke with patient she is aware.

## 2015-07-20 ENCOUNTER — Ambulatory Visit (HOSPITAL_BASED_OUTPATIENT_CLINIC_OR_DEPARTMENT_OTHER): Payer: Medicare Other

## 2015-07-20 ENCOUNTER — Other Ambulatory Visit (HOSPITAL_BASED_OUTPATIENT_CLINIC_OR_DEPARTMENT_OTHER): Payer: Medicare Other

## 2015-07-20 ENCOUNTER — Other Ambulatory Visit: Payer: Self-pay

## 2015-07-20 VITALS — BP 125/70 | HR 62 | Temp 97.8°F | Resp 14

## 2015-07-20 DIAGNOSIS — Z23 Encounter for immunization: Secondary | ICD-10-CM | POA: Diagnosis not present

## 2015-07-20 DIAGNOSIS — M3130 Wegener's granulomatosis without renal involvement: Secondary | ICD-10-CM

## 2015-07-20 LAB — COMPREHENSIVE METABOLIC PANEL (CC13)
ALBUMIN: 3.7 g/dL (ref 3.5–5.0)
ALK PHOS: 50 U/L (ref 40–150)
ALT: 16 U/L (ref 0–55)
AST: 15 U/L (ref 5–34)
Anion Gap: 8 mEq/L (ref 3–11)
BILIRUBIN TOTAL: 0.49 mg/dL (ref 0.20–1.20)
BUN: 17.9 mg/dL (ref 7.0–26.0)
CALCIUM: 9.3 mg/dL (ref 8.4–10.4)
CO2: 24 mEq/L (ref 22–29)
CREATININE: 0.8 mg/dL (ref 0.6–1.1)
Chloride: 108 mEq/L (ref 98–109)
EGFR: 83 mL/min/{1.73_m2} — ABNORMAL LOW (ref >90)
Glucose: 55 mg/dl — ABNORMAL LOW (ref 70–140)
POTASSIUM: 4.5 meq/L (ref 3.5–5.1)
Sodium: 140 mEq/L (ref 136–145)
TOTAL PROTEIN: 6.4 g/dL (ref 6.4–8.3)

## 2015-07-20 LAB — CBC WITH DIFFERENTIAL/PLATELET
BASO%: 1 % (ref 0.0–2.0)
Basophils Absolute: 0.1 10*3/uL (ref 0.0–0.1)
EOS%: 0.7 % (ref 0.0–7.0)
Eosinophils Absolute: 0.1 10*3/uL (ref 0.0–0.5)
HCT: 41.5 % (ref 34.8–46.6)
HEMOGLOBIN: 13.6 g/dL (ref 11.6–15.9)
LYMPH%: 12.2 % — AB (ref 14.0–49.7)
MCH: 29.8 pg (ref 25.1–34.0)
MCHC: 32.7 g/dL (ref 31.5–36.0)
MCV: 91.1 fL (ref 79.5–101.0)
MONO#: 0.5 10*3/uL (ref 0.1–0.9)
MONO%: 5.3 % (ref 0.0–14.0)
NEUT%: 80.8 % — ABNORMAL HIGH (ref 38.4–76.8)
NEUTROS ABS: 7.5 10*3/uL — AB (ref 1.5–6.5)
Platelets: 339 10*3/uL (ref 145–400)
RBC: 4.55 10*6/uL (ref 3.70–5.45)
RDW: 12.8 % (ref 11.2–14.5)
WBC: 9.2 10*3/uL (ref 3.9–10.3)
lymph#: 1.1 10*3/uL (ref 0.9–3.3)

## 2015-07-20 MED ORDER — INFLUENZA VAC SPLIT QUAD 0.5 ML IM SUSY
0.5000 mL | PREFILLED_SYRINGE | Freq: Once | INTRAMUSCULAR | Status: AC
  Administered 2015-07-20: 0.5 mL via INTRAMUSCULAR
  Filled 2015-07-20: qty 0.5

## 2015-08-06 ENCOUNTER — Encounter: Payer: Self-pay | Admitting: Oncology

## 2015-08-20 ENCOUNTER — Other Ambulatory Visit: Payer: Self-pay | Admitting: Oncology

## 2015-08-20 NOTE — Progress Notes (Unsigned)
She reinjured her foot. She is known to have osteoporosis. She receives reclast here yearly. She will be seeing [name] next week. She may want to consider forteo through his office-- we generally do not provide that here.

## 2015-08-24 ENCOUNTER — Telehealth: Payer: Self-pay | Admitting: *Deleted

## 2015-08-24 NOTE — Telephone Encounter (Signed)
Returning [name]'s earlier phone call.  [name] is s/p endovenous laser ablation of left greater saphenous vein on 06-03-2015 and s/p endovenous laser ablation right greater saphenous vein on 06-24-2015 both by Curt Jews MD. [name] states around Thanksgiving 2016 she stepped off a curb, rolled her left foot and fractured the left foot.  She states she is casted on left leg from her toes to her knee and has to wear the cast for 5 weeks.  She is concerned that she is leaning on her right side more since she is unable to bear weight on her left foot.  She is concerned about problems with circulation in her left foot and about forming new varicose veins on her right leg.  Reviewed Theresa [name]'s clinical history and concerns with Dr. Donnetta Hutching.  Dr. Donnetta Hutching stated that she was not at any additional risk for circulatory problems on her left leg/foot and that probability for developing varicosities on right leg was minimal.  Relayed by phone Dr. Luther Parody recommendations to [name].  She verbalized understanding and relief.  Encouraged her to call [facility] if she had further questions or concerns.

## 2015-09-10 ENCOUNTER — Telehealth: Payer: Self-pay | Admitting: *Deleted

## 2015-09-10 NOTE — Telephone Encounter (Signed)
Theresa [name] calling with question about the toes of her casted foot "turning blue while they are hanging down."  She fractured her left foot again and has been in cast for 4 weeks.  She states her orthopedist has re casted her several times over the past month.  She states she had office appointment with her orthopedist last week and brought up the issue of her left toe "turning blue when they were hanging down."  She states the orthopedist checked her toes for warmth and feeling and encouraged her to keep the left foot and leg elevated when sitting.  [name] denies left foot/toe pain, numbness.  States they are cool to touch but not cold and that when she elevates her left foot/leg the color returns to pink color.  Encouraged her to keep left foot elevated and to contact her orthopedist for questions or concerns.  [name] verbalized understanding.

## 2015-09-14 ENCOUNTER — Ambulatory Visit: Payer: Self-pay | Admitting: Internal Medicine

## 2015-09-14 ENCOUNTER — Encounter: Payer: Self-pay | Admitting: Internal Medicine

## 2015-09-14 ENCOUNTER — Ambulatory Visit (INDEPENDENT_AMBULATORY_CARE_PROVIDER_SITE_OTHER): Payer: Medicare Other | Admitting: Internal Medicine

## 2015-09-14 VITALS — BP 112/78 | HR 68 | Ht 66.5 in | Wt 161.2 lb

## 2015-09-14 DIAGNOSIS — M3130 Wegener's granulomatosis without renal involvement: Secondary | ICD-10-CM

## 2015-09-14 NOTE — Patient Instructions (Addendum)
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

## 2015-09-14 NOTE — Progress Notes (Signed)
Subjective:     Patient ID: Theresa [name], female   DOB: [birth year], 55 y.o.   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      55 year old female. 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  CAre direction is from Dr [name] at [facility] in [place], [place]. Main reason she is here she wants local pulmonologist back up  Reports that in  1998 when pregnant with daughter she developed severe headaches and sinusitis. She recollects mulitple inteventions and Rx prior sinus surgery x 2, prior right tear duct surgery at [facility] for epiphora (with subsequent re-obstruction). She also has a history of nasal saddling and inflammation left greater than right.  And biopsies with no diagnosis. In 1999 had upper airway obstruction and underwent emergency trach at [facility] (Dr [name]). Other interventions include:  Resuled in visit to [facility] and Dr [name] making diagnosis of GPA.   She has been treated in the past with Cytoxan and prednisone, methotrexate very briefly, Cytoxan orally, then Imuran for some time, and then off treatment for some years. She was started on prednisone alone in 2009, and then in 06/2008 the azathioprine was added again at 150 mg daily, and variable doses of prednisone. Then in end 2013, early 2013 had relapse and needed rituxan and was felt  She could not go below prednisone 41m per day. [facility] with Dr  MJana Hakimearly 2013.  Aside from the Wegener, she has a history of osteopenia, history of migraines, history of GERD, history of mitral valve prolapse, and a history of MRSA infection remotely (1999).   In 2014: but it appers disease in stable/remission with prednisone and in 2014: she was able to get disability. In May 2015 She followed with Dr MJana Hakim disease was felt to be stable. HEr main issues were fatigue, insomnia , anxiety, and mild anxiety. SHe has clarified that overall she has handled lif stressors well and does not suffer from anxiety disorder per se. IF atll anxiety is mild and only situational  She is now referred because she wants a local Rx team. [facility] expensive to [facility]. [facility]. She is making a trip up there in August 2015 and will see if she can continue going there but she will likely look for increasing Rx decisions from [facility]  [facility], [facility] and prefers pulmonary admit her if she became critically ill. She wants airway support in [place] we discussed local ENT docs. For now, she wants to hold off establishing someone as OPD. She is aware that if critical, ER and PCCM will coordinate that.  She also wants me to be her resouruce locally  I have agreed to her care plan     Smoking hx  reports that she has never smoked. She has never used smokeless tobacco.   CXR Oct 2012   RADIOLOGY REPORT*  Clinical Data: Cough for 5 days. Wegener's granulomatosis.  CHEST - 2 VIEW  Comparison: None.  Findings: Emphysema is present with flattening of hemidiaphragms  and enlargement retrosternal clear space. Bronchitic changes are  present in the lower lobes bilaterally. Cardiopericardial  silhouette and mediastinal contours appear within normal limits.  Vertebral body height is  preserved. No destructive osseous lesions  are identified. No plain film evidence of adenopathy.  IMPRESSION:  Hyperinflation consist with emphysema. No active cardiopulmonary  disease.  Original Report Authenticated By: Dereck Ligas, M.D.    Ok Edwards 06/18/2014  Chief Complaint  Patient presents with  . Follow-up    Pt state since last OV pt states she is feeling better. Pt states she is going to [facility] in November. Pt denies SOB, cough and CP/tightness.     Followup GPA   - She has postponed trip to [facility] due to father and father in law being sick with cancer. She did have soe headache, MRI Was normal per hx and headaches attributed to ativan weaning (she is tyrin to get herself off ativan which she has been on for decades). Shee feels wegner under remission. No hematuria or respiratory issues. Will defer labs to [facility] visit. Recent PR-3 aparently up a point. She has not had PREVNAR and will have it 06/18/2014   She did clarify to me that plan in case of airway emergency is NOT for [place] as indicated in visit June 2015 but [place] where she has new team (pripr docs have retired)     Past: She is trying to wean herself off Valier 01/22/2015  Chief Complaint  Patient presents with  . Follow-up    [facility]. Pt stated her breathing is doing well. Pt c/o mild prod cough with clear mucus. Pt denies CP/tightness.     Follow-up GPA  Last seen October 2015. After that she went for follow-up to [facility] in [place], [place]. Overall she's doing well. No respiratory issues. Occasionally there is some voice change. This is due to subglottic stenosis. [facility]: Nov 2015 patient is concerned about the fact her B cells are low. She wants to know what this means. I will try to get in touch with  [facility]  Drs [name] attending or Dr Regan Lemming ? Fellow to answer this questionENT at [facility] her noted to have minimal  erythema at right nare but stable subglottic area without inflammation ENT at [facility] evaluated her noted to have minimal erythema at right nare but stable subglottic area without inflammation. At that visit Nov 2015 - per notes  B Cell pending but PR-3 ANCA is 0.2.  Ovverall disease GPA was felt to be in remission  Their recommendaton 1) . She also asked them about coming off prednisone. They did not that she has had prednisoen burst 4-5 ties between her visit with them April 2014 and Nov 2015 with last burst in may 2015. They have maintained equipose in recommending if she can come off prednisone but are ok wit her trying to come off which she has done at this point. 2)  monitor Pr-3 Q3 months 3) continue bactrim 1 tab daily ( I do not see it in her [facility] today may 2016  visit)   [facility] 07[facility]10[facility]2014 notes reviewed and things I learned are  - GPA diagnosed June 1998 due ing 2nd prednancy.   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 s/p induction rituxan with subsequent slow to reconstitute and maintained on prednisone 53m daily which seems to control her symptoms  And since then ANCA- negative  - April 2013: BReno1 but ANCA negative (was positive prior to rituxan) - April 2014: Last seen by Dr SAdan Sisprior to Nov 2015 visit. PR-3 anca negative  this time - Rx Bactrim 1DS BID. Risk for relapse considered low  Past medical history since I las saw her - Got her second reclast  infusion recently and apparently this infusion was too fast and she had some side effects and intolerance - She has tennis elbow on right side  - She also has a left fourth toe bruising/fracture being managed conservatively - tried to wean off ativan (chronic) but did not work  1) Pred started in 1998/1999 - was off of it for about 1 year in the last 18 years but has been consistently on it since beginning.  2) Pt was started in Bactrim in 1999 - has been on this daily since then at 4010mdaily. Pt was on 80089mut decreased to 400m46mt was told this keeps her PNA at bay.[place] ANCA is being managed by Dr MagrJana [facility]imthe [facility]. Pt receives a lab kit in them mail, [facility] is drawn the kit is mailed back off.    OV 09/14/2015  Chief Complaint  Patient presents with  . Follow-up    Pt recently fractured her left foot. Pt states she recently had BIL varicose vein surgery. Pt c/o prod cough with green mucus. Pt denies CP/tightness and SOB.    Follow-up granulomatous polyangiitis  Six-month follow-up. In interim has fractured her left foot particularly the left fifth metatarsal and has been in acast for the last few months/several weeks. She is under the care of Dr. [place]ilah [place] has a new primary care physician. She feels vasculitis is in remission. [facility] and current follow-up is on hold until the cast comes off which he hopes this today. After this [facility]. Because of the fracture which is been deemed due to osteoporosis prednisone has been cut down to 4 mg per day by her and new endocrinologist Dr. JeffDagmar Hait this point in time she has a cough which is baseline for the winter but she does  not feel she is infected and overall feels vasculitis is in remission.  Current outpatient prescriptions:  .  albuterol (PROVENTIL HFA;VENTOLIN HFA) 108 (90 BASE) MCG/ACT inhaler, Inhale 2 puffs into the lungs every 6 (six) hours as needed. For shortness of breath or wheezing, Disp: , Rfl:  .  esomeprazole (NEXIUM) 40 MG capsule, Take 40 mg by mouth daily as needed. , Disp: , Rfl:  .  fluticasone (FLONASE) 50 MCG/ACT nasal spray, Place 2 sprays into the nose daily., Disp: , Rfl:  .  fluticasone (FLOVENT HFA) 110 MCG/ACT inhaler, Inhale 2 puffs into the lungs daily., Disp: , Rfl:  .  Hypertonic Nasal Wash (SINUS RINSE BOTTLE KIT NA), Place into the nose., Disp: , Rfl:  .  LORazepam (ATIVAN) 1 MG tablet, Take 1 tablet (1 mg total) by mouth 2 (two) times daily as needed for anxiety., Disp: 60 tablet, Rfl: 1 .  Melatonin 5 MG TABS, Take by mouth., Disp: , Rfl:  .  Multiple Vitamin (MULTIVITAMIN) tablet, Take 1 tablet by mouth daily., Disp: , Rfl:  .  Multiple Vitamins-Minerals (PRESERVISION AREDS 2 PO), Take by mouth., Disp: , Rfl:  .  predniSONE (DELTASONE) 5 MG tablet, Take 4 mg by mouth daily with breakfast. , Disp: , Rfl:  .  sulfamethoxazole-trimethoprim (BACTRIM,SEPTRA) 400-80 MG tablet, Take 1 tablet by mouth daily., Disp: , Rfl:  .  zolpidem (AMBIEN) 10 MG tablet, Take one tablet hs prn.  Do not take with Ativan, Disp: 30 tablet, Rfl: 1  Allergies  Allergen Reactions  . Codeine Itching  . Eletriptan Nausea Only  . Vancomycin Hives  . Tape Rash    Immunization History  Administered Date(s) Administered  . Hep B / HiB 11/02/2013  . Influenza Split 06/04/2013, 05/19/2014  . Influenza,inj,Quad PF,36+ Mos 07/20/2015  . Pneumococcal Conjugate-13 06/18/2014      Review of Systems Per hpi    Objective:   Physical Exam  Constitutional: She is oriented to person, place, and time. She appears well-developed and well-nourished. No distress.  HENT:  Head: Normocephalic and  atraumatic.  Right Ear: External ear normal.  Left Ear: External ear normal.  Mouth/Throat: Oropharynx is clear and moist. No oropharyngeal exudate.  Old trach scar=  Eyes: Conjunctivae and EOM are normal. Pupils are equal, round, and reactive to light. Right eye exhibits no discharge. Left eye exhibits no discharge. No scleral icterus.  Neck: Normal range of motion. Neck supple. No JVD present. No tracheal deviation present. No thyromegaly present.  Cardiovascular: Normal rate, regular rhythm, normal heart sounds and intact distal pulses.  Exam reveals no gallop and no friction rub.   No murmur heard. Pulmonary/Chest: Effort normal and breath sounds normal. No respiratory distress. She has no wheezes. She has no rales. She exhibits no tenderness.  Abdominal: Soft. Bowel sounds are normal. She exhibits no distension and no mass. There is no tenderness. There is no rebound and no guarding.  Musculoskeletal: Normal range of motion. She exhibits no edema or tenderness.  Lower extremity in cast and she is using crutches  Lymphadenopathy:    She has no cervical adenopathy.  Neurological: She is alert and oriented to person, place, and time. She has normal reflexes. No cranial nerve deficit. She exhibits normal muscle tone. Coordination normal.  Skin: Skin is warm and dry. No rash noted. She is not diaphoretic. No erythema. No pallor.  Psychiatric: She has a normal mood and affect. Her behavior is normal. Judgment and thought content normal.  Vitals reviewed.  Filed Vitals:   09/14/15 1008  BP: 112/78  Pulse: 68  Height: 5' 6.5" (1.689 m)  Weight: 161 lb 3.2 oz (73.12 kg)  SpO2: 97%        Assessment:       ICD-9-CM ICD-10-CM   1. Wegener's granulomatosis (granulomatosis with polyangiitis) (HCC) 446.4 M31.30        Plan:         #Granulomatous Polyangitis (GPA) disease  - Disease seems under remission based on clinical history and exam - Glad you are doing well other than left  foot  #[facility] as per your plan 6 months or sooner if needed  Please call or come sooner if any complaints or concerns   (> 50% of this 15 min visit spent in face to face counseling or/and coordination of care)   Dr. Brand Males, M.D., [facility].C.P Pulmonary and Critical Care Medicine Staff Physician [facility] Pager: [phone], If no answer or between  15:00h - 7:00h: call [phone]  09/14/2015 10:30 AM

## 2015-10-18 ENCOUNTER — Telehealth: Payer: Self-pay

## 2015-10-18 NOTE — Telephone Encounter (Signed)
Writer returned patient's call today who stated that she was going to [facility] on 2/20 thru 2/22 and would have lab work drawn when she is there.  Patient has appt tomorrow for labs- appt will be canceled, patient aware.  LVM requesting patient to have lab results as well as notes faxed to Dr. [name] after her [facility] visit.

## 2015-10-19 ENCOUNTER — Other Ambulatory Visit: Payer: Self-pay

## 2015-11-04 ENCOUNTER — Other Ambulatory Visit: Payer: Self-pay | Admitting: Sports Medicine

## 2015-11-04 DIAGNOSIS — S92355D Nondisplaced fracture of fifth metatarsal bone, left foot, subsequent encounter for fracture with routine healing: Secondary | ICD-10-CM

## 2015-11-12 ENCOUNTER — Ambulatory Visit
Admission: RE | Admit: 2015-11-12 | Discharge: 2015-11-12 | Disposition: A | Discharge status: Home / Self Care | Payer: Medicare Other | Source: Ambulatory Visit | Attending: Sports Medicine | Admitting: Sports Medicine

## 2015-11-12 DIAGNOSIS — S92355D Nondisplaced fracture of fifth metatarsal bone, left foot, subsequent encounter for fracture with routine healing: Secondary | ICD-10-CM

## 2016-01-04 ENCOUNTER — Telehealth: Payer: Self-pay | Admitting: Oncology

## 2016-01-04 NOTE — Telephone Encounter (Signed)
PAL - R/S FROM 5/16 TO 5/24. Left message for patient and mailed schedule.

## 2016-01-18 ENCOUNTER — Other Ambulatory Visit: Payer: Self-pay

## 2016-01-18 ENCOUNTER — Ambulatory Visit: Payer: Self-pay | Admitting: Oncology

## 2016-01-18 ENCOUNTER — Ambulatory Visit: Payer: Self-pay

## 2016-01-19 ENCOUNTER — Telehealth: Payer: Self-pay | Admitting: Oncology

## 2016-01-19 NOTE — Telephone Encounter (Signed)
returned call and lvm confirming appts... °

## 2016-01-21 ENCOUNTER — Other Ambulatory Visit: Payer: Self-pay | Admitting: *Deleted

## 2016-01-25 ENCOUNTER — Other Ambulatory Visit: Payer: Self-pay | Admitting: *Deleted

## 2016-01-25 DIAGNOSIS — M3130 Wegener's granulomatosis without renal involvement: Secondary | ICD-10-CM

## 2016-01-26 ENCOUNTER — Ambulatory Visit: Payer: Self-pay

## 2016-01-26 ENCOUNTER — Ambulatory Visit: Payer: Self-pay | Admitting: Oncology

## 2016-01-26 ENCOUNTER — Other Ambulatory Visit: Payer: Self-pay

## 2016-01-26 NOTE — Progress Notes (Signed)
o show

## 2016-02-11 ENCOUNTER — Other Ambulatory Visit: Payer: Self-pay | Admitting: *Deleted

## 2016-02-14 ENCOUNTER — Ambulatory Visit (HOSPITAL_COMMUNITY)
Admission: RE | Admit: 2016-02-14 | Discharge: 2016-02-14 | Disposition: A | Discharge status: Home / Self Care | Payer: Medicare Other | Source: Ambulatory Visit | Attending: Internal Medicine | Admitting: Internal Medicine

## 2016-02-15 ENCOUNTER — Ambulatory Visit (HOSPITAL_BASED_OUTPATIENT_CLINIC_OR_DEPARTMENT_OTHER): Payer: Medicare Other | Admitting: Oncology

## 2016-02-15 ENCOUNTER — Telehealth: Payer: Self-pay | Admitting: Oncology

## 2016-02-15 VITALS — BP 105/74 | HR 60 | Temp 98.3°F | Resp 18 | Ht 66.5 in | Wt 162.0 lb

## 2016-02-15 DIAGNOSIS — M810 Age-related osteoporosis without current pathological fracture: Secondary | ICD-10-CM | POA: Diagnosis not present

## 2016-02-15 DIAGNOSIS — M3130 Wegener's granulomatosis without renal involvement: Secondary | ICD-10-CM | POA: Diagnosis not present

## 2016-02-15 NOTE — Progress Notes (Signed)
ID: [name]  DOB: [birth year]  MR#: [id number]  CSN#: [id number]  Patient Care Team: Antony Contras, MD as PCP - General (Family Medicine) Brand Males, MD as Consulting Physician (Pulmonary Disease) Delrae Rend, MD as Consulting Physician (Endocrinology) Chauncey Cruel, MD as Consulting Physician (Oncology) Ernestine Conrad, MD as Referring Physician (Otolaryngology) Garen Grams, MD (Otolaryngology) Pedro Earls, MD as Attending Physician (Family Medicine) OTHER MD: [name] (fax [phone]); Jaquita Rector; Danton Sewer; Kandis Ban; [name] (fax [phone]); Huey Romans, Murali Ramaswami  History of wegener's granulomatosis:  Theresa [name] tells me her diagnosis of Wegener was initially made at the [facility] by Dr. [name] in 1999.  She has been treated in the past with Cytoxan and prednisone, methotrexate very briefly, Cytoxan orally, then Imuran for some time, and then off treatment for some years.  She was started on prednisone alone in 2009, and then in 06/2008 the azathioprine was added again at 150 mg daily, and variable doses of prednisone.  It is felt that she failed remission maintenance therapy since she could not reduce her dose of prednisone below 20 mg daily without having [name] resurgence of symptoms, and therefore she has been started on Rituxan with the first dose given at the [facility] on 06/24.  The patient tolerated the treatment quite well.  Her subsequent history is as detailed below  PMHx: Associated with the Wegener, the past medical history is significant for prior tracheostomy, prior sinus surgery x 2, prior right tear duct surgery at [facility] for epiphora (with subsequent re-obstruction).  She also has [name] history of nasal saddling and inflammation left greater than right.  She also gets occasional thrush problems because of the steroid treatment.  Aside from the Wegener, she has [name] history of osteoporosis, history of  migraines, history of GERD, history of mitral valve prolapse, and [name] history of MRSA infection remotely (1999).  Interval History:   Theresa [name] returns for followup of of her Wegener's. Since the last visit here, she has had problems with her left foot, followed by orthopedics. The fracture there is now being treated with [name] bone stimulator. She has established herself with Dr. Buddy Duty, who has reviewed her bone density and feels his alendronate is not working for her. They're planning either Forteo or Prolia. She continues to follow with Dr. Chase Caller as well as the otolaryngologists at [facility] and she has established herself with Dr. Rockwell Germany and primary care. In short she now has very good medical care in this area and are role accordingly becomes less important.  ROS:  She has cut the prednisone down to 4 mg [name] day with some guidance from the [facility] and she plans to keep it there. She is taking the Bactrim at probably ineffective doses, namely 400 mg once [name] week. She might as well stop or consider increasing that to DS twice weekly if she wants to really prophylax. She does have some arthritis pain particularly involving the big toe in the left foot where she was recently evaluated for gout and found not to have it. She has some ringing in her years. She bruises easily. She is gaining weight. Otherwise [name] detailed review of systems today was noncontributory.  Allergies  Allergen Reactions  . Codeine Itching  . Eletriptan Nausea Only  . Vancomycin Hives  . Tape Rash    Current Outpatient Prescriptions  Medication Sig Dispense Refill  . albuterol (PROVENTIL HFA;VENTOLIN HFA) 108 (90 BASE) MCG/ACT inhaler  Inhale 2 puffs into the lungs every 6 (six) hours as needed. For shortness of breath or wheezing    . esomeprazole (NEXIUM) 40 MG capsule Take 40 mg by mouth daily as needed.     . fluticasone (FLONASE) 50 MCG/ACT nasal spray Place 2 sprays into the nose daily.    . fluticasone (FLOVENT HFA) 110  MCG/ACT inhaler Inhale 2 puffs into the lungs daily.    . Hypertonic Nasal Wash (SINUS RINSE BOTTLE KIT NA) Place into the nose.    Marland Kitchen LORazepam (ATIVAN) 1 MG tablet Take 1 tablet (1 mg total) by mouth 2 (two) times daily as needed for anxiety. 60 tablet 1  . Melatonin 5 MG TABS Take by mouth.    . Multiple Vitamin (MULTIVITAMIN) tablet Take 1 tablet by mouth daily.    . Multiple Vitamins-Minerals (PRESERVISION AREDS 2 PO) Take by mouth.    . predniSONE (DELTASONE) 5 MG tablet Take 4 mg by mouth daily with breakfast.     . sulfamethoxazole-trimethoprim (BACTRIM,SEPTRA) 400-80 MG tablet Take 1 tablet by mouth daily.    Marland Kitchen zolpidem (AMBIEN) 10 MG tablet Take one tablet hs prn.  Do not take with Ativan 30 tablet 1   No current facility-administered medications for this visit.    FAMILY HISTORY:  The patient's father is alive in his early 30s.  The patient's mother is alive in her late 37s.  The patient has 3 sisters.  There is no one else in the family with autoimmune disease or with cancer.  GYNECOLOGIC HISTORY:  She is GX, P2, first pregnancy to term age 11.  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  Her former husband, Hilliard Clark, works for the [employer] for [employer], where he is the [employer].  They have [name] son currently Scientist, physiological and finance at [employer] and [name] daughter who is [name] Paramedic in high school at home with the patient.  They attend [employer] here in [place].  HEALTH MAINTENANCE:  She has never had [name] colonoscopy.  She is not [name] smoker, and never abused tobacco or alcohol.  She tells me her cholesterol is good.  She has [name] history of osteopenia. She is up to date on mammography (2013)  Objective: Middle-aged white woman in no acute distress  Filed Vitals:   02/15/16 1014  BP: 105/74  Pulse: 60  Temp: 98.3 F (36.8 C)  Resp: 18    BMI: Body mass index is 25.76  kg/(m^2).   ECOG FS: 1  Sclerae unicteric, pupils round and equal Oropharynx clear and moist-- no thrush or other lesions No cervical or supraclavicular adenopathy Lungs no rales or rhonchi Heart regular rate and rhythm Abd soft, nontender, positive bowel sounds MSK no focal spinal tenderness, no upper extremity lymphedema Neuro: nonfocal, well oriented, appropriate affect Breasts: Deferred   Lab Results:    Chemistry      Component Value Date/Time   NA 140 07/20/2015 1018   NA 140 08/17/2014 1511   K 4.5 07/20/2015 1018   K 4.5 08/17/2014 1511   CL 106 08/17/2014 1511   CL 106 11/07/2012 0916   CO2 24 07/20/2015 1018   CO2 25 08/17/2014 1511   BUN 17.9 07/20/2015 1018   BUN 14 08/17/2014 1511   CREATININE 0.8 07/20/2015 1018   CREATININE 0.92 08/17/2014 1511   CREATININE 0.93 04/25/2012 1023  Component Value Date/Time   CALCIUM 9.3 07/20/2015 1018   CALCIUM 9.1 08/17/2014 1511   ALKPHOS 50 07/20/2015 1018   ALKPHOS 42 08/17/2014 1511   AST 15 07/20/2015 1018   AST 18 08/17/2014 1511   ALT 16 07/20/2015 1018   ALT 17 08/17/2014 1511   BILITOT 0.49 07/20/2015 1018   BILITOT 0.4 08/17/2014 1511       Lab Results  Component Value Date   WBC 9.2 07/20/2015   HGB 13.6 07/20/2015   HCT 41.5 07/20/2015   MCV 91.1 07/20/2015   PLT 339 07/20/2015   NEUTROABS 7.5* 07/20/2015    Studies/Results: CLINICAL DATA: History of left fifth metatarsal fracture with left foot its history of left fifth metatarsal fracture. Twisting injuries left foot January in November 2016. Continued pain. Subsequent encounter.  EXAM: CT OF THE LEFT FOOT WITHOUT CONTRAST  TECHNIQUE: Multidetector CT imaging of the left foot was performed according to the standard protocol. Multiplanar CT image reconstructions were also generated.  COMPARISON: CT left foot 01/26/2015. Plain films left foot 10/29/2014 and 10/07/2014.  FINDINGS: Fracture through the proximal metaphysis of  the fifth metatarsal is again seen. Since the prior examination, the patient suffered [name] subsequent fracture in the same location with [name] new fracture line visible along the inferior and lateral cortex of the fifth metatarsal. Bridging bone is present about the dorsal and medial margins of the fracture site. Fracture lines appear corticated.  No other fracture is identified. There is no dislocation. Mild first MTP degenerative change is seen. Soft tissues demonstrate peroneus brevis tenosynovitis and longitudinal split tearing. Tendons are otherwise unremarkable. No ligament tear is identified.  IMPRESSION: Acute or subacute on chronic fracture of the fifth metatarsal is [name] partial union as described above.  Peroneus brevis tenosynovitis with longitudinal split tearing identified.   Electronically Signed  By: Inge Rise M.D.  On: 11/12/2015 10:11   Assessment: 55 y.o.  [place] woman with [name] history of Wegener's granulomatosis initially diagnosed in 1999, variously treated as noted above, s/p Rituxan x4 completed July 2010, currently on low-dose prednisone maintenance.  (1) osteoporosis, treated on alendronate between 2002 and 2005, ibandronate between 2005 in 2008, then risedronate between 2008 and 2011, first dose of Reclast/zolendronate 10/24/2013  ([name]) DEXA scan 12/22/2012 lowest reading -2.8 at the spine, -2.1 of the femoral neck  (b) repeat bone density 2017 (specific results not available today) show worsening T score-- zolendronate discontinued  Plan: Theresa [name] continues to do remarkably well as far as her Wegener's is concerned.  The main thing we have been doing for her aside from checking lab work is giving her the zolendronate. However at this point she is considering switching either to [facility] or Prolia under the direction of Dr. Buddy Duty. Accordingly we consider discontinuing follow-up here  The one thing that she does want to have done through our office is  checking the B-cell subsets which she is is doing  at the direction of Dr. Claire Shown. We can easily continue to facilitate that. It does mean that I will need to see her on [name] once [name] year basis and I have made an appointment for her.  She knows to call for any other problems that may develop before her next visit here.      MAGRINAT,GUSTAV C 02/15/2016

## 2016-02-15 NOTE — Telephone Encounter (Signed)
appt made and avs printed °

## 2016-02-16 ENCOUNTER — Ambulatory Visit (HOSPITAL_COMMUNITY): Admission: RE | Admit: 2016-02-16 | Payer: Medicare Other | Source: Ambulatory Visit

## 2016-02-29 ENCOUNTER — Other Ambulatory Visit (HOSPITAL_BASED_OUTPATIENT_CLINIC_OR_DEPARTMENT_OTHER): Payer: Medicare Other

## 2016-02-29 DIAGNOSIS — M3130 Wegener's granulomatosis without renal involvement: Secondary | ICD-10-CM

## 2016-02-29 LAB — COMPREHENSIVE METABOLIC PANEL
ALBUMIN: 3.7 g/dL (ref 3.5–5.0)
ALK PHOS: 55 U/L (ref 40–150)
ALT: 21 U/L (ref 0–55)
AST: 18 U/L (ref 5–34)
Anion Gap: 8 mEq/L (ref 3–11)
BILIRUBIN TOTAL: 0.49 mg/dL (ref 0.20–1.20)
BUN: 14.6 mg/dL (ref 7.0–26.0)
CO2: 27 mEq/L (ref 22–29)
CREATININE: 1 mg/dL (ref 0.6–1.1)
Calcium: 9.4 mg/dL (ref 8.4–10.4)
Chloride: 105 mEq/L (ref 98–109)
EGFR: 66 mL/min/{1.73_m2} — ABNORMAL LOW (ref >90)
GLUCOSE: 80 mg/dL (ref 70–140)
Potassium: 4.5 mEq/L (ref 3.5–5.1)
SODIUM: 140 meq/L (ref 136–145)
TOTAL PROTEIN: 6.8 g/dL (ref 6.4–8.3)

## 2016-02-29 LAB — CBC WITH DIFFERENTIAL/PLATELET
BASO%: 1.1 % (ref 0.0–2.0)
Basophils Absolute: 0.1 10*3/uL (ref 0.0–0.1)
EOS ABS: 0.2 10*3/uL (ref 0.0–0.5)
EOS%: 2.4 % (ref 0.0–7.0)
HCT: 42.1 % (ref 34.8–46.6)
HEMOGLOBIN: 13.7 g/dL (ref 11.6–15.9)
LYMPH%: 36.6 % (ref 14.0–49.7)
MCH: 29.8 pg (ref 25.1–34.0)
MCHC: 32.5 g/dL (ref 31.5–36.0)
MCV: 91.5 fL (ref 79.5–101.0)
MONO#: 0.9 10*3/uL (ref 0.1–0.9)
MONO%: 10.8 % (ref 0.0–14.0)
NEUT%: 49.1 % (ref 38.4–76.8)
NEUTROS ABS: 4 10*3/uL (ref 1.5–6.5)
Platelets: 329 10*3/uL (ref 145–400)
RBC: 4.6 10*6/uL (ref 3.70–5.45)
RDW: 13.5 % (ref 11.2–14.5)
WBC: 8.2 10*3/uL (ref 3.9–10.3)
lymph#: 3 10*3/uL (ref 0.9–3.3)

## 2016-03-15 ENCOUNTER — Other Ambulatory Visit: Payer: Self-pay | Admitting: Sports Medicine

## 2016-03-15 DIAGNOSIS — S92355K Nondisplaced fracture of fifth metatarsal bone, left foot, subsequent encounter for fracture with nonunion: Secondary | ICD-10-CM

## 2016-03-21 ENCOUNTER — Ambulatory Visit: Payer: Self-pay | Admitting: Internal Medicine

## 2016-03-27 ENCOUNTER — Telehealth: Payer: Self-pay | Admitting: *Deleted

## 2016-03-27 DIAGNOSIS — M3130 Wegener's granulomatosis without renal involvement: Secondary | ICD-10-CM

## 2016-03-27 DIAGNOSIS — Z7952 Long term (current) use of systemic steroids: Secondary | ICD-10-CM

## 2016-03-27 DIAGNOSIS — M810 Age-related osteoporosis without current pathological fracture: Secondary | ICD-10-CM

## 2016-03-27 DIAGNOSIS — E042 Nontoxic multinodular goiter: Secondary | ICD-10-CM

## 2016-03-27 NOTE — Telephone Encounter (Signed)
This RN spoke with pt per her call wanting to follow up on previous calls regarding cost to her if she obtains her prolia for osteoporosis in this office.  She has obtained reclast in the past on day of MD visit and cost was only her copay for MD visit.  Note prior calls were transferred to managed care and she states she has not received a return call.  Theresa [name] would also like to be referred to a new endocrinologist per her previous MD has retired .  Per call - Concern relating to cost issues will be sent to Managed Care.  Per MD review - referral will be made to Dr Loanne Drilling with [facility] .

## 2016-03-28 ENCOUNTER — Ambulatory Visit: Payer: Self-pay | Admitting: Internal Medicine

## 2016-04-03 ENCOUNTER — Other Ambulatory Visit: Payer: Self-pay

## 2016-04-05 ENCOUNTER — Ambulatory Visit
Admission: RE | Admit: 2016-04-05 | Discharge: 2016-04-05 | Disposition: A | Discharge status: Home / Self Care | Payer: Medicare Other | Source: Ambulatory Visit | Attending: Sports Medicine | Admitting: Sports Medicine

## 2016-04-05 ENCOUNTER — Telehealth: Payer: Self-pay

## 2016-04-05 DIAGNOSIS — S92355K Nondisplaced fracture of fifth metatarsal bone, left foot, subsequent encounter for fracture with nonunion: Secondary | ICD-10-CM

## 2016-04-05 NOTE — Telephone Encounter (Signed)
Pt LM as follows:  She would like to have Prolia here and is ready to set that up.  She would like another referral for an endocrinologist - would prefer someone younger that "won't retire on her like all her other docs".    LMOVM - identified voice mail.  Returned call to pt re: prolia and endocrinologist, advised I would route this message to Dr. [name] and Marlon Pel.  Pt to call clinic if she has any questions.

## 2016-04-11 ENCOUNTER — Other Ambulatory Visit: Payer: Self-pay | Admitting: Oncology

## 2016-04-11 ENCOUNTER — Telehealth: Payer: Self-pay | Admitting: Oncology

## 2016-04-11 DIAGNOSIS — M810 Age-related osteoporosis without current pathological fracture: Secondary | ICD-10-CM

## 2016-04-11 DIAGNOSIS — D8989 Other specified disorders involving the immune mechanism, not elsewhere classified: Secondary | ICD-10-CM

## 2016-04-11 NOTE — Telephone Encounter (Signed)
lvm to inform pt of 8/29 appt date.time per GM orders

## 2016-04-11 NOTE — Telephone Encounter (Signed)
Referral placed to Dr Renato Shin- also request sent to schedule Prolia with this office.

## 2016-04-11 NOTE — Progress Notes (Signed)
Theresa [name] has decided she would like to receive Prolia and would like to receive it in our office. I have gone ahead and entered the appropriate orders. She was also referred to Dr. Loanne Drilling for endocrinology follow-up

## 2016-05-02 ENCOUNTER — Ambulatory Visit (HOSPITAL_BASED_OUTPATIENT_CLINIC_OR_DEPARTMENT_OTHER): Payer: Medicare Other

## 2016-05-02 ENCOUNTER — Other Ambulatory Visit (HOSPITAL_BASED_OUTPATIENT_CLINIC_OR_DEPARTMENT_OTHER): Payer: Medicare Other

## 2016-05-02 VITALS — BP 104/61 | HR 68 | Temp 97.3°F | Resp 18

## 2016-05-02 DIAGNOSIS — D8989 Other specified disorders involving the immune mechanism, not elsewhere classified: Secondary | ICD-10-CM

## 2016-05-02 DIAGNOSIS — M810 Age-related osteoporosis without current pathological fracture: Secondary | ICD-10-CM

## 2016-05-02 DIAGNOSIS — M3130 Wegener's granulomatosis without renal involvement: Secondary | ICD-10-CM | POA: Diagnosis not present

## 2016-05-02 LAB — CBC WITH DIFFERENTIAL/PLATELET
BASO%: 1 % (ref 0.0–2.0)
Basophils Absolute: 0.1 10*3/uL (ref 0.0–0.1)
EOS ABS: 0.1 10*3/uL (ref 0.0–0.5)
EOS%: 1 % (ref 0.0–7.0)
HEMATOCRIT: 41.9 % (ref 34.8–46.6)
HEMOGLOBIN: 13.8 g/dL (ref 11.6–15.9)
LYMPH#: 1.7 10*3/uL (ref 0.9–3.3)
LYMPH%: 20 % (ref 14.0–49.7)
MCH: 30.2 pg (ref 25.1–34.0)
MCHC: 33 g/dL (ref 31.5–36.0)
MCV: 91.4 fL (ref 79.5–101.0)
MONO#: 0.6 10*3/uL (ref 0.1–0.9)
MONO%: 7.3 % (ref 0.0–14.0)
NEUT%: 70.7 % (ref 38.4–76.8)
NEUTROS ABS: 6 10*3/uL (ref 1.5–6.5)
PLATELETS: 324 10*3/uL (ref 145–400)
RBC: 4.58 10*6/uL (ref 3.70–5.45)
RDW: 13.2 % (ref 11.2–14.5)
WBC: 8.4 10*3/uL (ref 3.9–10.3)

## 2016-05-02 LAB — COMPREHENSIVE METABOLIC PANEL
ALBUMIN: 3.6 g/dL (ref 3.5–5.0)
ALK PHOS: 54 U/L (ref 40–150)
ALT: 17 U/L (ref 0–55)
ANION GAP: 8 meq/L (ref 3–11)
AST: 19 U/L (ref 5–34)
BILIRUBIN TOTAL: 0.35 mg/dL (ref 0.20–1.20)
BUN: 15.4 mg/dL (ref 7.0–26.0)
CALCIUM: 9.7 mg/dL (ref 8.4–10.4)
CO2: 26 meq/L (ref 22–29)
CREATININE: 0.9 mg/dL (ref 0.6–1.1)
Chloride: 107 mEq/L (ref 98–109)
EGFR: 74 mL/min/{1.73_m2} — AB (ref >90)
Glucose: 80 mg/dl (ref 70–140)
Potassium: 4.6 mEq/L (ref 3.5–5.1)
Sodium: 141 mEq/L (ref 136–145)
TOTAL PROTEIN: 6.8 g/dL (ref 6.4–8.3)

## 2016-05-02 MED ORDER — DENOSUMAB 60 MG/ML ~~LOC~~ SOLN
60.0000 mg | Freq: Once | SUBCUTANEOUS | Status: AC
  Administered 2016-05-02: 60 mg via SUBCUTANEOUS
  Filled 2016-05-02: qty 1

## 2016-05-02 NOTE — Patient Instructions (Signed)
Denosumab injection  What is this medicine?  DENOSUMAB (den oh sue mab) slows bone breakdown. Prolia is used to treat osteoporosis in women after menopause and in men. Xgeva is used to prevent bone fractures and other bone problems caused by cancer bone metastases. Xgeva is also used to treat giant cell tumor of the bone.  This medicine may be used for other purposes; ask your health care provider or pharmacist if you have questions.  What should I tell my health care provider before I take this medicine?  They need to know if you have any of these conditions:  -dental disease  -eczema  -infection or history of infections  -kidney disease or on dialysis  -low blood calcium or vitamin D  -malabsorption syndrome  -scheduled to have surgery or tooth extraction  -taking medicine that contains denosumab  -thyroid or parathyroid disease  -an unusual reaction to denosumab, other medicines, foods, dyes, or preservatives  -pregnant or trying to get pregnant  -breast-feeding  How should I use this medicine?  This medicine is for injection under the skin. It is given by a health care professional in a hospital or clinic setting.  If you are getting Prolia, a special MedGuide will be given to you by the pharmacist with each prescription and refill. Be sure to read this information carefully each time.  For Prolia, talk to your pediatrician regarding the use of this medicine in children. Special care may be needed. For Xgeva, talk to your pediatrician regarding the use of this medicine in children. While this drug may be prescribed for children as young as 13 years for selected conditions, precautions do apply.  Overdosage: If you think you have taken too much of this medicine contact a poison control center or emergency room at once.  NOTE: This medicine is only for you. Do not share this medicine with others.  What if I miss a dose?  It is important not to miss your dose. Call your doctor or health care professional if you are  unable to keep an appointment.  What may interact with this medicine?  Do not take this medicine with any of the following medications:  -other medicines containing denosumab  This medicine may also interact with the following medications:  -medicines that suppress the immune system  -medicines that treat cancer  -steroid medicines like prednisone or cortisone  This list may not describe all possible interactions. Give your health care provider a list of all the medicines, herbs, non-prescription drugs, or dietary supplements you use. Also tell them if you smoke, drink alcohol, or use illegal drugs. Some items may interact with your medicine.  What should I watch for while using this medicine?  Visit your doctor or health care professional for regular checks on your progress. Your doctor or health care professional may order blood tests and other tests to see how you are doing.  Call your doctor or health care professional if you get a cold or other infection while receiving this medicine. Do not treat yourself. This medicine may decrease your body's ability to fight infection.  You should make sure you get enough calcium and vitamin D while you are taking this medicine, unless your doctor tells you not to. Discuss the foods you eat and the vitamins you take with your health care professional.  See your dentist regularly. Brush and floss your teeth as directed. Before you have any dental work done, tell your dentist you are receiving this medicine.  Do   not become pregnant while taking this medicine or for 5 months after stopping it. Women should inform their doctor if they wish to become pregnant or think they might be pregnant. There is a potential for serious side effects to an unborn child. Talk to your health care professional or pharmacist for more information.  What side effects may I notice from receiving this medicine?  Side effects that you should report to your doctor or health care professional as soon as  possible:  -allergic reactions like skin rash, itching or hives, swelling of the face, lips, or tongue  -breathing problems  -chest pain  -fast, irregular heartbeat  -feeling faint or lightheaded, falls  -fever, chills, or any other sign of infection  -muscle spasms, tightening, or twitches  -numbness or tingling  -skin blisters or bumps, or is dry, peels, or red  -slow healing or unexplained pain in the mouth or jaw  -unusual bleeding or bruising  Side effects that usually do not require medical attention (Report these to your doctor or health care professional if they continue or are bothersome.):  -muscle pain  -stomach upset, gas  This list may not describe all possible side effects. Call your doctor for medical advice about side effects. You may report side effects to FDA at 1-800-FDA-1088.  Where should I keep my medicine?  This medicine is only given in a clinic, doctor's office, or other health care setting and will not be stored at home.  NOTE: This sheet is a summary. It may not cover all possible information. If you have questions about this medicine, talk to your doctor, pharmacist, or health care provider.      2016, Elsevier/Gold Standard. (2012-02-19 12:37:47)

## 2016-07-18 ENCOUNTER — Other Ambulatory Visit: Payer: Medicare Other

## 2016-07-18 DIAGNOSIS — D8989 Other specified disorders involving the immune mechanism, not elsewhere classified: Secondary | ICD-10-CM

## 2016-08-14 ENCOUNTER — Encounter: Payer: Self-pay | Admitting: Oncology

## 2016-08-18 ENCOUNTER — Ambulatory Visit
Admission: RE | Admit: 2016-08-18 | Discharge: 2016-08-18 | Disposition: A | Discharge status: Home / Self Care | Payer: Medicare Other | Source: Ambulatory Visit | Attending: Family Medicine | Admitting: Family Medicine

## 2016-08-18 ENCOUNTER — Other Ambulatory Visit: Payer: Self-pay | Admitting: Family Medicine

## 2016-08-18 DIAGNOSIS — R1030 Lower abdominal pain, unspecified: Secondary | ICD-10-CM

## 2016-08-18 MED ORDER — IOPAMIDOL (ISOVUE-300) INJECTION 61%
100.0000 mL | Freq: Once | INTRAVENOUS | Status: AC | PRN
  Administered 2016-08-18: 100 mL via INTRAVENOUS

## 2016-08-21 ENCOUNTER — Other Ambulatory Visit: Payer: Self-pay | Admitting: Family Medicine

## 2016-10-03 ENCOUNTER — Other Ambulatory Visit (HOSPITAL_BASED_OUTPATIENT_CLINIC_OR_DEPARTMENT_OTHER): Payer: Medicare Other

## 2016-10-03 ENCOUNTER — Ambulatory Visit (HOSPITAL_BASED_OUTPATIENT_CLINIC_OR_DEPARTMENT_OTHER): Payer: Medicare Other

## 2016-10-03 VITALS — BP 113/74 | HR 58 | Temp 97.8°F | Resp 20

## 2016-10-03 DIAGNOSIS — M810 Age-related osteoporosis without current pathological fracture: Secondary | ICD-10-CM

## 2016-10-03 DIAGNOSIS — D8989 Other specified disorders involving the immune mechanism, not elsewhere classified: Secondary | ICD-10-CM

## 2016-10-03 DIAGNOSIS — M3130 Wegener's granulomatosis without renal involvement: Secondary | ICD-10-CM

## 2016-10-03 LAB — COMPREHENSIVE METABOLIC PANEL
ALBUMIN: 3.8 g/dL (ref 3.5–5.0)
ALT: 28 U/L (ref 0–55)
ANION GAP: 7 meq/L (ref 3–11)
AST: 19 U/L (ref 5–34)
Alkaline Phosphatase: 45 U/L (ref 40–150)
BILIRUBIN TOTAL: 0.29 mg/dL (ref 0.20–1.20)
BUN: 14.3 mg/dL (ref 7.0–26.0)
CO2: 26 meq/L (ref 22–29)
CREATININE: 0.8 mg/dL (ref 0.6–1.1)
Calcium: 9.6 mg/dL (ref 8.4–10.4)
Chloride: 105 mEq/L (ref 98–109)
EGFR: 81 mL/min/{1.73_m2} — ABNORMAL LOW (ref >90)
GLUCOSE: 87 mg/dL (ref 70–140)
Potassium: 4.8 mEq/L (ref 3.5–5.1)
Sodium: 138 mEq/L (ref 136–145)
TOTAL PROTEIN: 6.7 g/dL (ref 6.4–8.3)

## 2016-10-03 LAB — CBC WITH DIFFERENTIAL/PLATELET
BASO%: 0.8 % (ref 0.0–2.0)
Basophils Absolute: 0.1 10*3/uL (ref 0.0–0.1)
EOS%: 1.1 % (ref 0.0–7.0)
Eosinophils Absolute: 0.1 10*3/uL (ref 0.0–0.5)
HCT: 41.5 % (ref 34.8–46.6)
HEMOGLOBIN: 13.8 g/dL (ref 11.6–15.9)
LYMPH#: 2.2 10*3/uL (ref 0.9–3.3)
LYMPH%: 22.2 % (ref 14.0–49.7)
MCH: 30.5 pg (ref 25.1–34.0)
MCHC: 33.3 g/dL (ref 31.5–36.0)
MCV: 91.7 fL (ref 79.5–101.0)
MONO#: 0.8 10*3/uL (ref 0.1–0.9)
MONO%: 8.3 % (ref 0.0–14.0)
NEUT%: 67.6 % (ref 38.4–76.8)
NEUTROS ABS: 6.7 10*3/uL — AB (ref 1.5–6.5)
PLATELETS: 309 10*3/uL (ref 145–400)
RBC: 4.52 10*6/uL (ref 3.70–5.45)
RDW: 13.7 % (ref 11.2–14.5)
WBC: 9.9 10*3/uL (ref 3.9–10.3)

## 2016-10-03 MED ORDER — DENOSUMAB 60 MG/ML ~~LOC~~ SOLN
60.0000 mg | Freq: Once | SUBCUTANEOUS | Status: DC
  Administered 2016-10-03: 60 mg via SUBCUTANEOUS

## 2016-10-03 NOTE — Patient Instructions (Signed)
Denosumab injection  What is this medicine?  DENOSUMAB (den oh sue mab) slows bone breakdown. Prolia is used to treat osteoporosis in women after menopause and in men. Xgeva is used to prevent bone fractures and other bone problems caused by cancer bone metastases. Xgeva is also used to treat giant cell tumor of the bone.  This medicine may be used for other purposes; ask your health care provider or pharmacist if you have questions.  What should I tell my health care provider before I take this medicine?  They need to know if you have any of these conditions:  -dental disease  -eczema  -infection or history of infections  -kidney disease or on dialysis  -low blood calcium or vitamin D  -malabsorption syndrome  -scheduled to have surgery or tooth extraction  -taking medicine that contains denosumab  -thyroid or parathyroid disease  -an unusual reaction to denosumab, other medicines, foods, dyes, or preservatives  -pregnant or trying to get pregnant  -breast-feeding  How should I use this medicine?  This medicine is for injection under the skin. It is given by a health care professional in a hospital or clinic setting.  If you are getting Prolia, a special MedGuide will be given to you by the pharmacist with each prescription and refill. Be sure to read this information carefully each time.  For Prolia, talk to your pediatrician regarding the use of this medicine in children. Special care may be needed. For Xgeva, talk to your pediatrician regarding the use of this medicine in children. While this drug may be prescribed for children as young as 13 years for selected conditions, precautions do apply.  Overdosage: If you think you have taken too much of this medicine contact a poison control center or emergency room at once.  NOTE: This medicine is only for you. Do not share this medicine with others.  What if I miss a dose?  It is important not to miss your dose. Call your doctor or health care professional if you are  unable to keep an appointment.  What may interact with this medicine?  Do not take this medicine with any of the following medications:  -other medicines containing denosumab  This medicine may also interact with the following medications:  -medicines that suppress the immune system  -medicines that treat cancer  -steroid medicines like prednisone or cortisone  This list may not describe all possible interactions. Give your health care provider a list of all the medicines, herbs, non-prescription drugs, or dietary supplements you use. Also tell them if you smoke, drink alcohol, or use illegal drugs. Some items may interact with your medicine.  What should I watch for while using this medicine?  Visit your doctor or health care professional for regular checks on your progress. Your doctor or health care professional may order blood tests and other tests to see how you are doing.  Call your doctor or health care professional if you get a cold or other infection while receiving this medicine. Do not treat yourself. This medicine may decrease your body's ability to fight infection.  You should make sure you get enough calcium and vitamin D while you are taking this medicine, unless your doctor tells you not to. Discuss the foods you eat and the vitamins you take with your health care professional.  See your dentist regularly. Brush and floss your teeth as directed. Before you have any dental work done, tell your dentist you are receiving this medicine.  Do   not become pregnant while taking this medicine or for 5 months after stopping it. Women should inform their doctor if they wish to become pregnant or think they might be pregnant. There is a potential for serious side effects to an unborn child. Talk to your health care professional or pharmacist for more information.  What side effects may I notice from receiving this medicine?  Side effects that you should report to your doctor or health care professional as soon as  possible:  -allergic reactions like skin rash, itching or hives, swelling of the face, lips, or tongue  -breathing problems  -chest pain  -fast, irregular heartbeat  -feeling faint or lightheaded, falls  -fever, chills, or any other sign of infection  -muscle spasms, tightening, or twitches  -numbness or tingling  -skin blisters or bumps, or is dry, peels, or red  -slow healing or unexplained pain in the mouth or jaw  -unusual bleeding or bruising  Side effects that usually do not require medical attention (Report these to your doctor or health care professional if they continue or are bothersome.):  -muscle pain  -stomach upset, gas  This list may not describe all possible side effects. Call your doctor for medical advice about side effects. You may report side effects to FDA at 1-800-FDA-1088.  Where should I keep my medicine?  This medicine is only given in a clinic, doctor's office, or other health care setting and will not be stored at home.  NOTE: This sheet is a summary. It may not cover all possible information. If you have questions about this medicine, talk to your doctor, pharmacist, or health care provider.      2016, Elsevier/Gold Standard. (2012-02-19 12:37:47)

## 2016-10-12 ENCOUNTER — Other Ambulatory Visit: Payer: Self-pay | Admitting: Endocrinology

## 2016-10-12 DIAGNOSIS — E041 Nontoxic single thyroid nodule: Secondary | ICD-10-CM

## 2016-10-17 ENCOUNTER — Other Ambulatory Visit: Payer: Self-pay

## 2016-10-18 ENCOUNTER — Other Ambulatory Visit: Payer: Self-pay

## 2016-10-18 ENCOUNTER — Ambulatory Visit
Admission: RE | Admit: 2016-10-18 | Discharge: 2016-10-18 | Disposition: A | Discharge status: Home / Self Care | Payer: Medicare Other | Source: Ambulatory Visit | Attending: Endocrinology | Admitting: Endocrinology

## 2016-10-18 DIAGNOSIS — E041 Nontoxic single thyroid nodule: Secondary | ICD-10-CM

## 2016-10-24 ENCOUNTER — Other Ambulatory Visit: Payer: Self-pay | Admitting: Endocrinology

## 2016-10-24 DIAGNOSIS — E041 Nontoxic single thyroid nodule: Secondary | ICD-10-CM

## 2016-11-06 ENCOUNTER — Other Ambulatory Visit (HOSPITAL_BASED_OUTPATIENT_CLINIC_OR_DEPARTMENT_OTHER): Payer: Medicare Other

## 2016-11-06 DIAGNOSIS — M3130 Wegener's granulomatosis without renal involvement: Secondary | ICD-10-CM | POA: Diagnosis not present

## 2016-11-06 DIAGNOSIS — D8989 Other specified disorders involving the immune mechanism, not elsewhere classified: Secondary | ICD-10-CM

## 2016-11-06 LAB — COMPREHENSIVE METABOLIC PANEL
ALBUMIN: 4 g/dL (ref 3.5–5.0)
ALK PHOS: 46 U/L (ref 40–150)
ALT: 23 U/L (ref 0–55)
AST: 17 U/L (ref 5–34)
Anion Gap: 9 mEq/L (ref 3–11)
BILIRUBIN TOTAL: 0.39 mg/dL (ref 0.20–1.20)
BUN: 15.8 mg/dL (ref 7.0–26.0)
CALCIUM: 9.8 mg/dL (ref 8.4–10.4)
CO2: 27 mEq/L (ref 22–29)
CREATININE: 0.8 mg/dL (ref 0.6–1.1)
Chloride: 106 mEq/L (ref 98–109)
EGFR: 82 mL/min/{1.73_m2} — ABNORMAL LOW (ref >90)
Glucose: 85 mg/dl (ref 70–140)
Potassium: 4.5 mEq/L (ref 3.5–5.1)
Sodium: 141 mEq/L (ref 136–145)
Total Protein: 7.1 g/dL (ref 6.4–8.3)

## 2016-11-06 LAB — CBC WITH DIFFERENTIAL/PLATELET
BASO%: 0.8 % (ref 0.0–2.0)
Basophils Absolute: 0.1 10*3/uL (ref 0.0–0.1)
EOS%: 1.1 % (ref 0.0–7.0)
Eosinophils Absolute: 0.1 10*3/uL (ref 0.0–0.5)
HEMATOCRIT: 42.5 % (ref 34.8–46.6)
HEMOGLOBIN: 14.1 g/dL (ref 11.6–15.9)
LYMPH#: 1.9 10*3/uL (ref 0.9–3.3)
LYMPH%: 22.2 % (ref 14.0–49.7)
MCH: 30.5 pg (ref 25.1–34.0)
MCHC: 33.3 g/dL (ref 31.5–36.0)
MCV: 91.8 fL (ref 79.5–101.0)
MONO#: 0.8 10*3/uL (ref 0.1–0.9)
MONO%: 8.9 % (ref 0.0–14.0)
NEUT#: 5.9 10*3/uL (ref 1.5–6.5)
NEUT%: 67 % (ref 38.4–76.8)
PLATELETS: 311 10*3/uL (ref 145–400)
RBC: 4.63 10*6/uL (ref 3.70–5.45)
RDW: 13.2 % (ref 11.2–14.5)
WBC: 8.7 10*3/uL (ref 3.9–10.3)

## 2016-11-09 ENCOUNTER — Telehealth: Payer: Self-pay | Admitting: *Deleted

## 2016-11-09 NOTE — Telephone Encounter (Signed)
This RN returned call to pt per prior discussion regarding her concerns with recent U/S of thyroid and recommendation for biopsy.  Biopsy is currently scheduled at [facility] imaging for 3/20.  Theresa [name] wanted Dr Gerarda Fraction review and if she needed a consult by a surgeon per above vs a radiologist.  Per MD review - above is a FNA procedure ( not a core ) - current plan is appropriate.  This RN left message on identified VM requesting a return call.

## 2016-11-21 ENCOUNTER — Other Ambulatory Visit (HOSPITAL_COMMUNITY)
Admission: RE | Admit: 2016-11-21 | Discharge: 2016-11-21 | Disposition: A | Discharge status: Home / Self Care | Payer: Medicare Other | Source: Ambulatory Visit | Attending: Interventional Radiology | Admitting: Interventional Radiology

## 2016-11-21 ENCOUNTER — Ambulatory Visit
Admission: RE | Admit: 2016-11-21 | Discharge: 2016-11-21 | Disposition: A | Discharge status: Home / Self Care | Payer: Medicare Other | Source: Ambulatory Visit | Attending: Endocrinology | Admitting: Endocrinology

## 2016-11-21 DIAGNOSIS — E041 Nontoxic single thyroid nodule: Secondary | ICD-10-CM

## 2017-01-30 ENCOUNTER — Other Ambulatory Visit: Payer: Self-pay | Admitting: Sports Medicine

## 2017-01-30 DIAGNOSIS — M1712 Unilateral primary osteoarthritis, left knee: Secondary | ICD-10-CM

## 2017-02-09 ENCOUNTER — Other Ambulatory Visit: Payer: Self-pay

## 2017-02-14 ENCOUNTER — Ambulatory Visit (HOSPITAL_BASED_OUTPATIENT_CLINIC_OR_DEPARTMENT_OTHER): Payer: Medicare Other | Admitting: Oncology

## 2017-02-14 ENCOUNTER — Encounter: Payer: Self-pay | Admitting: Oncology

## 2017-02-14 VITALS — BP 115/70 | HR 60 | Temp 98.0°F | Resp 16 | Ht 66.5 in | Wt 157.3 lb

## 2017-02-14 DIAGNOSIS — M810 Age-related osteoporosis without current pathological fracture: Secondary | ICD-10-CM

## 2017-02-14 DIAGNOSIS — M818 Other osteoporosis without current pathological fracture: Secondary | ICD-10-CM

## 2017-02-14 DIAGNOSIS — M3130 Wegener's granulomatosis without renal involvement: Secondary | ICD-10-CM

## 2017-02-14 NOTE — Progress Notes (Signed)
ID: Theresa [name]  DOB: Feb 26, 1961  MR#: [id number]  CSN#: [id number]  Patient Care Team: Antony Contras, MD as PCP - General (Family Medicine) Brand Males, MD as Consulting Physician (Pulmonary Disease) Delrae Rend, MD as Consulting Physician (Endocrinology) Shaylon Gillean, Virgie Dad, MD as Consulting Physician (Oncology) Ernestine Conrad, MD as Referring Physician (Otolaryngology) Clinger, Chrystie Nose, MD (Otolaryngology) Pedro Earls, MD as Attending Physician (Family Medicine) OTHER MD: [name] (fax [phone]); Jaquita Rector; Danton Sewer; Kandis Ban; [name] (fax [phone]); Huey Romans, Murali Ramaswami  Interval History:   Theresa [name] returns today for 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 We facilitate her lab work and so I see her on a once a year basis to make sure everything remains in order.  Since her last visit here she had biopsy of 2 thyroid nodules on 11/21/2016. One was benign (Bethesda category 2 (NZA [id number]). This second nodule was Bethesda category 1, which basically means no findings of concern but also insufficient tissue for definitive diagnosis. This is being followed through Dr. [place] office  She has been found to have some changes in her left nostril which may require rhinoplasty, which she would do at the [facility].  ROS:  She has her labs drawn here for B-cell counts which are monitored and reviewed at the [facility]. She tells me they are finding her counts stay low although she has had no treatment for quite some time. 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 This may indeed be long-term. Aside from this, she generally feels well. She denies significant cough, phlegm production or pleurisy at this point. There been no intercurrent fevers or pain. Her weight is stable. She has some joint pains here and there which are not more intense  or persistent than before. She has some knee issues. A detailed review of systems was otherwise stable  History of wegener's granulomatosis:  [name] tells me her diagnosis of Wegener was initially made at the [facility] by Dr. [name] in 1999.  She has been treated in the past with Cytoxan and prednisone, methotrexate very briefly, Cytoxan orally, then Imuran for some time, and then off treatment for some years.  She was started on prednisone alone in 2009, and then in 06/2008 the azathioprine was added again at 150 mg daily, and variable doses of prednisone.  It is felt that she failed remission maintenance therapy since she could not reduce her dose of prednisone below 20 mg daily without having a resurgence of symptoms, and therefore she has been started on Rituxan with the first dose given at the [facility] on 06/24.  The patient tolerated the treatment quite well.  Her subsequent history is as detailed below  PMHx: Associated with the Wegener, the past medical history is significant for prior tracheostomy, prior sinus surgery x 2, prior right tear duct surgery at [facility] for epiphora (with subsequent re-obstruction).  She also has a history of nasal saddling and inflammation left greater than right.  She also gets occasional thrush problems because of the steroid treatment.  Aside from the Wegener, she has a history of osteoporosis, history of migraines, history of GERD, history of mitral valve prolapse, and a history of MRSA infection remotely (1999).    Allergies  Allergen Reactions  . Codeine Itching  . Eletriptan Nausea Only  . Vancomycin Hives  . Tape Rash    Current Outpatient Prescriptions  Medication Sig  Dispense Refill  . albuterol (PROVENTIL HFA;VENTOLIN HFA) 108 (90 BASE) MCG/ACT inhaler Inhale 2 puffs into the lungs every 6 (six) hours as needed. For shortness of breath or wheezing    . esomeprazole (NEXIUM) 40 MG capsule Take 40 mg by mouth daily as needed.     .  fluticasone (FLONASE) 50 MCG/ACT nasal spray Place 2 sprays into the nose daily.    . fluticasone (FLOVENT HFA) 110 MCG/ACT inhaler Inhale 2 puffs into the lungs daily.    . Hypertonic Nasal Wash (SINUS RINSE BOTTLE KIT NA) Place into the nose.    Marland Kitchen LORazepam (ATIVAN) 1 MG tablet Take 1 tablet (1 mg total) by mouth 2 (two) times daily as needed for anxiety. 60 tablet 1  . Melatonin 5 MG TABS Take by mouth.    . Multiple Vitamin (MULTIVITAMIN) tablet Take 1 tablet by mouth daily.    . Multiple Vitamins-Minerals (PRESERVISION AREDS 2 PO) Take by mouth.    . predniSONE (DELTASONE) 5 MG tablet Take 4 mg by mouth daily with breakfast.     . sulfamethoxazole-trimethoprim (BACTRIM,SEPTRA) 400-80 MG tablet Take 1 tablet by mouth daily.    Marland Kitchen zolpidem (AMBIEN) 10 MG tablet Take one tablet hs prn.  Do not take with Ativan 30 tablet 1   No current facility-administered medications for this visit.     FAMILY HISTORY:  The patient's father is alive in his early 20s.  The patient's mother is alive in her late 65s.  The patient has 3 sisters.  There is no one else in the family with autoimmune disease or with cancer.  GYNECOLOGIC HISTORY:  She is GX, P2, first pregnancy to term age 40.  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  They have a son  Theresa [name], currently in [place] and a second son, Theresa [name], going to [place] .the patient attends [employer] here in [place].   Objective: Middle-aged white woman who appears well  Vitals:   02/14/17 1013  BP: 115/70  Pulse: 60  Resp: 16  Temp: 98 F (36.7 C)    BMI: Body mass index is 25.01 kg/m.   ECOG FS: 1  Sclerae unicteric, EOMs intact Oropharynx clear and moist, no external nasal  abnormality noted No cervical or supraclavicular adenopathy Lungs no rales or rhonchi Heart regular rate and rhythm Abd soft, nontender, positive bowel sounds MSK no focal spinal tenderness, no upper extremity lymphedema Neuro: nonfocal, well oriented, appropriate affect Breasts: Deferred    Lab Results:    Chemistry      Component Value Date/Time   NA 141 11/06/2016 1142   K 4.5 11/06/2016 1142   CL 106 08/17/2014 1511   CL 106 11/07/2012 0916   CO2 27 11/06/2016 1142   BUN 15.8 11/06/2016 1142   CREATININE 0.8 11/06/2016 1142      Component Value Date/Time   CALCIUM 9.8 11/06/2016 1142   ALKPHOS 46 11/06/2016 1142   AST 17 11/06/2016 1142   ALT 23 11/06/2016 1142   BILITOT 0.39 11/06/2016 1142       Lab Results  Component Value Date   WBC 8.7 11/06/2016   HGB 14.1 11/06/2016   HCT 42.5 11/06/2016   MCV 91.8  11/06/2016   PLT 311 11/06/2016   NEUTROABS 5.9 11/06/2016    Studies/Results: Bilateral screening mammography at [facility] 12/07/2016 found a breast density to be category B. There was no evidence of malignancy Assessment: 56 y.o.  [place] woman with a history of Wegener's granulomatosis initially diagnosed in 1999, variously treated as noted above, s/p Rituxan x4 completed July 2010, currently on low-dose prednisone maintenance.  (1) osteoporosis, treated on alendronate between 2002 and 2005, ibandronate between 2005 in 2008, then risedronate between 2008 and 2011, first dose of Reclast/zolendronate 10/24/2013  (a) DEXA scan 12/22/2012 lowest reading -2.8 at the spine, -2.1 of the femoral neck  (b) repeat bone density 2017 (specific results not available today) show worsening T score-- zolendronate discontinued  Plan: Chalsey is doing remarkably well as far as her Wegener's is concerned. Our role in that regard is limited: She has excellent pulmonologists at [facility] and locally through Dr. Chase Caller her thyroid issues are being handled through Dr. Buddy Duty. She has  good primary care follow-up  I have set her up for lab work every 3 months for the next year and to see me again a year from now. She knows I will be glad to see her at any point if I can be of help.    Mirta Mally C 02/14/2017

## 2017-03-13 ENCOUNTER — Other Ambulatory Visit: Payer: Self-pay | Admitting: Internal Medicine

## 2017-03-13 DIAGNOSIS — E042 Nontoxic multinodular goiter: Secondary | ICD-10-CM

## 2017-03-22 ENCOUNTER — Other Ambulatory Visit: Payer: Self-pay

## 2017-04-03 ENCOUNTER — Ambulatory Visit
Admission: RE | Admit: 2017-04-03 | Discharge: 2017-04-03 | Disposition: A | Discharge status: Home / Self Care | Payer: Medicare Other | Source: Ambulatory Visit | Attending: Internal Medicine | Admitting: Internal Medicine

## 2017-04-03 ENCOUNTER — Other Ambulatory Visit (HOSPITAL_COMMUNITY)
Admission: RE | Admit: 2017-04-03 | Discharge: 2017-04-03 | Disposition: A | Discharge status: Home / Self Care | Payer: Medicare Other | Source: Ambulatory Visit | Attending: Physician Assistant | Admitting: Physician Assistant

## 2017-04-03 DIAGNOSIS — E042 Nontoxic multinodular goiter: Secondary | ICD-10-CM | POA: Diagnosis present

## 2017-04-03 NOTE — Procedures (Signed)
Interventional Radiology Procedure Note  Procedure: US guided biopsy of left thyroid nodule.  5 FNA performed.  Complications: None Recommendations:  - Ok to shower tomorrow - Do not submerge for 7 days - Routine care   Signed,  Dulcy Fanny. Earleen Newport, DO

## 2017-04-09 ENCOUNTER — Other Ambulatory Visit: Payer: Self-pay

## 2017-04-09 ENCOUNTER — Other Ambulatory Visit (HOSPITAL_BASED_OUTPATIENT_CLINIC_OR_DEPARTMENT_OTHER): Payer: Medicare Other

## 2017-04-09 DIAGNOSIS — D8989 Other specified disorders involving the immune mechanism, not elsewhere classified: Secondary | ICD-10-CM

## 2017-04-09 DIAGNOSIS — M3130 Wegener's granulomatosis without renal involvement: Secondary | ICD-10-CM

## 2017-04-09 LAB — COMPREHENSIVE METABOLIC PANEL
ALBUMIN: 3.7 g/dL (ref 3.5–5.0)
ALT: 17 U/L (ref 0–55)
AST: 19 U/L (ref 5–34)
Alkaline Phosphatase: 40 U/L (ref 40–150)
Anion Gap: 7 mEq/L (ref 3–11)
BUN: 12.6 mg/dL (ref 7.0–26.0)
CHLORIDE: 110 meq/L — AB (ref 98–109)
CO2: 26 mEq/L (ref 22–29)
Calcium: 9.5 mg/dL (ref 8.4–10.4)
Creatinine: 0.8 mg/dL (ref 0.6–1.1)
EGFR: 82 mL/min/{1.73_m2} — AB (ref >90)
GLUCOSE: 70 mg/dL (ref 70–140)
POTASSIUM: 4 meq/L (ref 3.5–5.1)
SODIUM: 143 meq/L (ref 136–145)
Total Bilirubin: 0.49 mg/dL (ref 0.20–1.20)
Total Protein: 6.5 g/dL (ref 6.4–8.3)

## 2017-04-09 LAB — CBC WITH DIFFERENTIAL/PLATELET
BASO%: 1.2 % (ref 0.0–2.0)
BASOS ABS: 0.1 10*3/uL (ref 0.0–0.1)
EOS ABS: 0.1 10*3/uL (ref 0.0–0.5)
EOS%: 1.7 % (ref 0.0–7.0)
HCT: 42.4 % (ref 34.8–46.6)
HEMOGLOBIN: 14 g/dL (ref 11.6–15.9)
LYMPH%: 32.8 % (ref 14.0–49.7)
MCH: 30.5 pg (ref 25.1–34.0)
MCHC: 32.9 g/dL (ref 31.5–36.0)
MCV: 92.9 fL (ref 79.5–101.0)
MONO#: 0.6 10*3/uL (ref 0.1–0.9)
MONO%: 9.4 % (ref 0.0–14.0)
NEUT#: 3.5 10*3/uL (ref 1.5–6.5)
NEUT%: 54.9 % (ref 38.4–76.8)
Platelets: 303 10*3/uL (ref 145–400)
RBC: 4.57 10*6/uL (ref 3.70–5.45)
RDW: 13.3 % (ref 11.2–14.5)
WBC: 6.4 10*3/uL (ref 3.9–10.3)
lymph#: 2.1 10*3/uL (ref 0.9–3.3)

## 2017-04-30 ENCOUNTER — Other Ambulatory Visit: Payer: Self-pay

## 2017-04-30 ENCOUNTER — Other Ambulatory Visit (HOSPITAL_BASED_OUTPATIENT_CLINIC_OR_DEPARTMENT_OTHER): Payer: Medicare Other

## 2017-04-30 DIAGNOSIS — M3130 Wegener's granulomatosis without renal involvement: Secondary | ICD-10-CM | POA: Diagnosis not present

## 2017-04-30 DIAGNOSIS — D8989 Other specified disorders involving the immune mechanism, not elsewhere classified: Secondary | ICD-10-CM

## 2017-04-30 LAB — CBC WITH DIFFERENTIAL/PLATELET
BASO%: 0.5 % (ref 0.0–2.0)
BASOS ABS: 0.1 10*3/uL (ref 0.0–0.1)
EOS ABS: 0.1 10*3/uL (ref 0.0–0.5)
EOS%: 1.3 % (ref 0.0–7.0)
HEMATOCRIT: 40.2 % (ref 34.8–46.6)
HEMOGLOBIN: 13.2 g/dL (ref 11.6–15.9)
LYMPH#: 2.7 10*3/uL (ref 0.9–3.3)
LYMPH%: 28.5 % (ref 14.0–49.7)
MCH: 30.4 pg (ref 25.1–34.0)
MCHC: 32.8 g/dL (ref 31.5–36.0)
MCV: 92.6 fL (ref 79.5–101.0)
MONO#: 0.6 10*3/uL (ref 0.1–0.9)
MONO%: 6.6 % (ref 0.0–14.0)
NEUT#: 5.9 10*3/uL (ref 1.5–6.5)
NEUT%: 63.1 % (ref 38.4–76.8)
PLATELETS: 313 10*3/uL (ref 145–400)
RBC: 4.34 10*6/uL (ref 3.70–5.45)
RDW: 13.1 % (ref 11.2–14.5)
WBC: 9.3 10*3/uL (ref 3.9–10.3)

## 2017-04-30 LAB — COMPREHENSIVE METABOLIC PANEL
ALBUMIN: 3.7 g/dL (ref 3.5–5.0)
ALK PHOS: 41 U/L (ref 40–150)
ALT: 16 U/L (ref 0–55)
ANION GAP: 7 meq/L (ref 3–11)
AST: 15 U/L (ref 5–34)
BILIRUBIN TOTAL: 0.45 mg/dL (ref 0.20–1.20)
BUN: 11.6 mg/dL (ref 7.0–26.0)
CO2: 27 mEq/L (ref 22–29)
Calcium: 9.3 mg/dL (ref 8.4–10.4)
Chloride: 105 mEq/L (ref 98–109)
Creatinine: 0.9 mg/dL (ref 0.6–1.1)
EGFR: 77 mL/min/{1.73_m2} — AB (ref >90)
GLUCOSE: 95 mg/dL (ref 70–140)
Potassium: 3.9 mEq/L (ref 3.5–5.1)
SODIUM: 138 meq/L (ref 136–145)
TOTAL PROTEIN: 6.7 g/dL (ref 6.4–8.3)

## 2017-05-09 ENCOUNTER — Encounter: Payer: Self-pay | Admitting: Physical Therapy

## 2017-05-09 ENCOUNTER — Ambulatory Visit: Payer: Medicare Other | Attending: Orthopedic Surgery | Admitting: Physical Therapy

## 2017-05-09 DIAGNOSIS — R262 Difficulty in walking, not elsewhere classified: Secondary | ICD-10-CM | POA: Insufficient documentation

## 2017-05-09 DIAGNOSIS — M25562 Pain in left knee: Secondary | ICD-10-CM

## 2017-05-09 NOTE — Therapy (Signed)
[address] [place], [place], [address] Phone: [phone]   Fax:  [phone]  Physical Therapy Evaluation  Patient Details  Name: Theresa [name] MRN: [id number] Date of Birth: 1961-06-22 Referring Provider: Wynelle Link  Encounter Date: 05/09/2017      PT End of Session - 05/09/17 0921    Visit Number 1   Date for PT Re-Evaluation 07/09/17   PT Start Time 0850   PT Stop Time 0928   PT Time Calculation (min) 38 min   Activity Tolerance Patient tolerated treatment well   Behavior During Therapy [facility] for tasks assessed/performed      Past Medical History:  Diagnosis Date  . Anxiety   . Arthritis   . GERD (gastroesophageal reflux disease)   . Headaches, cluster   . Hx MRSA infection   . Mitral valve prolapse   . Osteoporosis   . Varicose veins   . Wegener's granulomatosis ([employer])     Past Surgical History:  Procedure Laterality Date  . AIRWAY FOREIGN BODY REMOVAL    . ENDOVENOUS ABLATION SAPHENOUS VEIN W/ LASER Left 06-03-2015   endovenous laser ablation left greater saphenous vein and stab phlebectomy 10-20 incisions left leg by Curt Jews MD  . ENDOVENOUS ABLATION SAPHENOUS VEIN W/ LASER Right 06-24-2015   endovenous laser ablation right greater saphenous vein and stab phlebectomy 10-20 incisions   . EYE SURGERY    . mitral valve prolaspe    . NASAL SINUS SURGERY    . tear duct surgery    . TRACHEOSTOMY CLOSURE      There were no vitals filed for this visit.       Subjective Assessment - 05/09/17 0852    Subjective Patient reports that she started having left knee pain over the past month or two, she is unsure of a cause, but reports that she has had two 5th metatarsal fractures over the past two years, so she thinks it could be the compensation as she has been in a boot for that.     Limitations Walking   Patient Stated Goals have less knee pain   Currently in Pain? Yes   Pain Score 1     Pain Location Knee   Pain Orientation Left   Pain Descriptors / Indicators Aching;Sharp   Pain Type Acute pain   Pain Onset More than a month ago   Pain Frequency Intermittent   Aggravating Factors  stairs, squatting and getting up from floor, up to 7/10   Pain Relieving Factors rest pain can be a 0/10   Effect of Pain on Daily Activities difficulty with walking, stairs, getting up and down            [facility] PT Assessment - 05/09/17 0001      Assessment   Medical Diagnosis left knee pain   Referring Provider Aluisio   Onset Date/Surgical Date 04/08/17     Precautions   Precautions None     Balance Screen   Has the patient fallen in the past 6 months No   Has the patient had a decrease in activity level because of a fear of falling?  No   Is the patient reluctant to leave their home because of a fear of falling?  No     Home Environment   Additional Comments has stairs, does housework     Prior Function   Level of Independence Independent   Vocation Part time employment  Vocation Requirements sitting/standing   Leisure "Y" elliptical, afrraid to do weights with legs     ROM / Strength   AROM / PROM / Strength AROM;Strength     AROM   AROM Assessment Site Knee   Right/Left Knee Left   Left Knee Extension 0   Left Knee Flexion 110     Strength   Overall Strength Comments 4/5 with pain for extension     Flexibility   Soft Tissue Assessment /Muscle Length --  tight ITB, mild HS tightness     Palpation   Palpation comment crepitus with extension, mild warmth peripatellar     Ambulation/Gait   Gait Comments has antalgic gait with stairs, worse going up, gait very mild antalgic on the left            Objective measurements completed on examination: See above findings.                  PT Education - 05/09/17 [phone]    Education provided Yes   Education Details HEP for VMO activation, ITB stretches   Person(s) Educated Patient   Methods  Explanation;Demonstration;Tactile cues;Verbal cues;Handout   Comprehension Verbalized understanding;Returned demonstration;Verbal cues required;Tactile cues required          PT Short Term Goals - 05/09/17 0924      PT SHORT TERM GOAL #1   Title independent with initial HEP   Time 2   Period Weeks   Status New           PT Long Term Goals - 05/09/17 6295      PT LONG TERM GOAL #1   Title decrease pain 50%   Time 8   Period Weeks   Status New     PT LONG TERM GOAL #2   Title go up and down stairs without difficulty   Time 8   Period Weeks   Status New     PT LONG TERM GOAL #3   Title get up from floor with minimal difficulty and pain   Time 8   Period Weeks   Status New     PT LONG TERM GOAL #4   Title return safely to the gym   Time 8   Period Weeks   Status New                Plan - 05/09/17 2841    Clinical Impression Statement Patient has left knee pain, she seems to have some ITB tightness, has weak VMO with lateral tracking patella, she has crepitus with knee extension.  Mild warmth peripatellar.  Difficulty with stairs, and getting up from sitting   Clinical Presentation Stable   Clinical Decision Making Low   Rehab Potential Good   PT Frequency 2x / week   PT Duration 8 weeks   PT Treatment/Interventions Cryotherapy;Electrical Stimulation;Iontophoresis 4mg /ml Dexamethasone;Stair training;Gait training;Therapeutic activities;Therapeutic exercise;Balance training;Neuromuscular re-education;Manual techniques;Vasopneumatic Device;Taping   PT Next Visit Plan work to activate VMO and stretch ITB/lateralis, avoid crepitus if possible   Consulted and Agree with Plan of Care Patient      Patient will benefit from skilled therapeutic intervention in order to improve the following deficits and impairments:  Abnormal gait, Decreased activity tolerance, Decreased strength, Increased edema, Improper body mechanics, Impaired flexibility, Pain, Difficulty  walking  Visit Diagnosis: Acute pain of left knee - Plan: PT plan of care cert/re-cert  Difficulty in walking, not elsewhere classified - Plan: PT plan of care cert/re-cert  G-Codes - 05/09/17 7322    Functional Assessment Tool Used (Outpatient Only) foto 53% limitation   Functional Limitation Mobility: Walking and moving around   Mobility: Walking and Moving Around Current Status [phone]) At least 40 percent but less than 60 percent impaired, limited or restricted   Mobility: Walking and Moving Around Goal Status [phone]) At least 20 percent but less than 40 percent impaired, limited or restricted       Problem List Patient Active Problem List   Diagnosis Date Noted  . Wegener's granulomatosis (granulomatosis with polyangiitis) ([place]) 01/24/2015  . Goiter, nontoxic, multinodular  per pt history  12/07/2014  . Sleep disorder 10/20/2014  . Injury of left foot 10/08/2014  . Varicose veins of lower extremities with complications 76/28/3151  . GERD (gastroesophageal reflux disease) 07/07/2014  . DJD (degenerative joint disease) 07/07/2014  . Family history of breast cancer in sister 07/07/2014  . Osteoporosis  on Reclast per Dr. Davonna Belling 07/06/2014  . Anxiety state 07/06/2014  . Insomnia  off BZP now  07/06/2014  . Migraine headache 07/06/2014  . Hx MRSA infection 07/06/2014  . Granulomatosis with polyangiitis ([place]) 03/24/2009    Sumner Boast., PT 05/09/2017, 9:32 AM  [address] [place], [place], [address] Phone: [phone]   Fax:  [phone]  Name: KAEDANCE MAGOS MRN: [id number] Date of Birth: 13-May-1961

## 2017-05-16 ENCOUNTER — Encounter: Payer: Self-pay | Admitting: Physical Therapy

## 2017-05-16 ENCOUNTER — Ambulatory Visit: Payer: Medicare Other | Admitting: Physical Therapy

## 2017-05-16 DIAGNOSIS — R262 Difficulty in walking, not elsewhere classified: Secondary | ICD-10-CM

## 2017-05-16 DIAGNOSIS — M25562 Pain in left knee: Secondary | ICD-10-CM

## 2017-05-16 NOTE — Therapy (Signed)
[address] [place], [place], [address] Phone: [phone]   Fax:  [phone]  Physical Therapy Treatment  Patient Details  Name: Theresa [name] MRN: [id number] Date of Birth: May 12, 1961 Referring Provider: Wynelle Link  Encounter Date: 05/16/2017      PT End of Session - 05/16/17 1048    Visit Number 2   Date for PT Re-Evaluation 07/09/17   PT Start Time 0930   PT Stop Time 1020   PT Time Calculation (min) 50 min   Activity Tolerance Patient tolerated treatment well   Behavior During Therapy [facility] for tasks assessed/performed      Past Medical History:  Diagnosis Date  . Anxiety   . Arthritis   . GERD (gastroesophageal reflux disease)   . Headaches, cluster   . Hx MRSA infection   . Mitral valve prolapse   . Osteoporosis   . Varicose veins   . Wegener's granulomatosis ([place])     Past Surgical History:  Procedure Laterality Date  . AIRWAY FOREIGN BODY REMOVAL    . ENDOVENOUS ABLATION SAPHENOUS VEIN W/ LASER Left 06-03-2015   endovenous laser ablation left greater saphenous vein and stab phlebectomy 10-20 incisions left leg by Curt Jews MD  . ENDOVENOUS ABLATION SAPHENOUS VEIN W/ LASER Right 06-24-2015   endovenous laser ablation right greater saphenous vein and stab phlebectomy 10-20 incisions   . EYE SURGERY    . mitral valve prolaspe    . NASAL SINUS SURGERY    . tear duct surgery    . TRACHEOSTOMY CLOSURE      There were no vitals filed for this visit.      Subjective Assessment - 05/16/17 0934    Subjective Patient reports that the SAQ's cause popping and pain and she stopped that, reports that the QS does cause some popping if she "really squeeze"s.  REports tried a different elliptical last night that caused popping   Currently in Pain? Yes   Pain Score 1    Pain Location Knee   Pain Orientation Left                         [facility] Adult PT Treatment/Exercise  - 05/16/17 0001      Exercises   Exercises Knee/Hip     Knee/Hip Exercises: Aerobic   Nustep level 5 x 6 minutes     Knee/Hip Exercises: Machines for Strengthening   Cybex Knee Extension 5# x10 small ROM but a lot of popping laterally   Cybex Knee Flexion 25# 2x10   Total Gym Leg Press 20# 2x10, with small ROM   Other Machine tried a 4" step up again with severe crepitus     Knee/Hip Exercises: Supine   Quad Sets 2 sets;10 reps   Short Arc Quad Sets 2 sets;10 reps   Short Arc Quad Sets Limitations with the tape on     Modalities   Modalities Iontophoresis;Ultrasound     Ultrasound   Ultrasound Location left peripatellar area   Ultrasound Parameters 100% 1.3w/cm2   Ultrasound Goals Pain     Iontophoresis   Type of Iontophoresis Dexamethasone   Location left lateral patella and distal   Dose 46m   Time 4 hour patch #1     Manual Therapy   Manual Therapy Soft tissue mobilization;Taping   Manual therapy comments passive ITB stretches   Soft tissue mobilization left ITB with roller   Kinesiotex  Inhibit Muscle     Kinesiotix   Inhibit Muscle  tried to alleviate some pressure on patella and have a gentle medial pull                  PT Short Term Goals - 05/16/17 1050      PT SHORT TERM GOAL #1   Title independent with initial HEP   Status Partially Met           PT Long Term Goals - 05/09/17 0924      PT LONG TERM GOAL #1   Title decrease pain 50%   Time 8   Period Weeks   Status New     PT LONG TERM GOAL #2   Title go up and down stairs without difficulty   Time 8   Period Weeks   Status New     PT LONG TERM GOAL #3   Title get up from floor with minimal difficulty and pain   Time 8   Period Weeks   Status New     PT LONG TERM GOAL #4   Title return safely to the gym   Time 8   Period Weeks   Status New               Plan - 05/16/17 1049    Clinical Impression Statement Very difficult to get exercises that do not create  the crepitus and pain, tried kinesiotape, with little change, may try leukotape next.   PT Next Visit Plan see if she feels that the modalities helped   Consulted and Agree with Plan of Care Patient      Patient will benefit from skilled therapeutic intervention in order to improve the following deficits and impairments:  Abnormal gait, Decreased activity tolerance, Decreased strength, Increased edema, Improper body mechanics, Impaired flexibility, Pain, Difficulty walking  Visit Diagnosis: Acute pain of left knee  Difficulty in walking, not elsewhere classified     Problem List Patient Active Problem List   Diagnosis Date Noted  . Wegener's granulomatosis (granulomatosis with polyangiitis) ([place]) 01/24/2015  . Goiter, nontoxic, multinodular  per pt history  12/07/2014  . Sleep disorder 10/20/2014  . Injury of left foot 10/08/2014  . Varicose veins of lower extremities with complications 80/88/1103  . GERD (gastroesophageal reflux disease) 07/07/2014  . DJD (degenerative joint disease) 07/07/2014  . Family history of breast cancer in sister 07/07/2014  . Osteoporosis  on Reclast per Dr. Davonna Belling 07/06/2014  . Anxiety state 07/06/2014  . Insomnia  off BZP now  07/06/2014  . Migraine headache 07/06/2014  . Hx MRSA infection 07/06/2014  . Granulomatosis with polyangiitis ([place]) 03/24/2009    Sumner Boast., PT 05/16/2017, 10:55 AM  [address] [place], [place], [address] Phone: [phone]   Fax:  [phone]  Name: Theresa [name] MRN: [id number] Date of Birth: Oct 17, 1960

## 2017-06-04 ENCOUNTER — Ambulatory Visit: Payer: Medicare Other | Attending: Orthopedic Surgery | Admitting: Physical Therapy

## 2017-06-04 ENCOUNTER — Encounter: Payer: Self-pay | Admitting: Physical Therapy

## 2017-06-04 DIAGNOSIS — R262 Difficulty in walking, not elsewhere classified: Secondary | ICD-10-CM | POA: Diagnosis present

## 2017-06-04 DIAGNOSIS — M25562 Pain in left knee: Secondary | ICD-10-CM | POA: Diagnosis present

## 2017-06-04 NOTE — Therapy (Signed)
[address] [place], [place], [address] Phone: [phone]   Fax:  [phone]  Physical Therapy Treatment  Patient Details  Name: Theresa [name] MRN: [id number] Date of Birth: October 29, 1960 Referring Provider: Wynelle Link  Encounter Date: 06/04/2017      PT End of Session - 06/04/17 1105    Visit Number 3   Date for PT Re-Evaluation 07/09/17   PT Start Time 1014   PT Stop Time 1118   PT Time Calculation (min) 64 min   Activity Tolerance Patient tolerated treatment well   Behavior During Therapy [facility] for tasks assessed/performed      Past Medical History:  Diagnosis Date  . Anxiety   . Arthritis   . GERD (gastroesophageal reflux disease)   . Headaches, cluster   . Hx MRSA infection   . Mitral valve prolapse   . Osteoporosis   . Varicose veins   . Wegener's granulomatosis ([place])     Past Surgical History:  Procedure Laterality Date  . AIRWAY FOREIGN BODY REMOVAL    . ENDOVENOUS ABLATION SAPHENOUS VEIN W/ LASER Left 06-03-2015   endovenous laser ablation left greater saphenous vein and stab phlebectomy 10-20 incisions left leg by Curt Jews MD  . ENDOVENOUS ABLATION SAPHENOUS VEIN W/ LASER Right 06-24-2015   endovenous laser ablation right greater saphenous vein and stab phlebectomy 10-20 incisions   . EYE SURGERY    . mitral valve prolaspe    . NASAL SINUS SURGERY    . tear duct surgery    . TRACHEOSTOMY CLOSURE      There were no vitals filed for this visit.      Subjective Assessment - 06/04/17 0934    Subjective I think the tape and the patch helped some.  Reports that she has been away with her parents   Currently in Pain? Yes   Pain Score 1    Pain Location Knee   Pain Orientation Left;Lateral                         [facility] Adult PT Treatment/Exercise - 06/04/17 0001      High Level Balance   High Level Balance Comments Bosu balance and ball toss     Knee/Hip  Exercises: Stretches   Passive Hamstring Stretch 3 reps;30 seconds   Other Knee/Hip Stretches ITB stretch     Knee/Hip Exercises: Aerobic   Nustep level 5 x 6 minutes     Knee/Hip Exercises: Machines for Strengthening   Cybex Knee Flexion 35# 2x10   Total Gym Leg Press 20# 2x10, with small ROM, single leg left   Other Machine SLS with 4# dead lift     Knee/Hip Exercises: Supine   Quad Sets 2 sets;10 reps   Short Arc Quad Sets 2 sets;10 reps     Iontophoresis   Type of Iontophoresis Dexamethasone   Location left lateral patella and distal   Dose 33mA   Time 4 hour patch #2     Manual Therapy   Manual Therapy Soft tissue mobilization;Taping   Kinesiotex Inhibit Muscle     Kinesiotix   Inhibit Muscle  tried to alleviate some pressure on patella and have a gentle medial pull                  PT Short Term Goals - 06/04/17 1107      PT SHORT TERM GOAL #1   Title  independent with initial HEP   Status Achieved           PT Long Term Goals - 05/09/17 0924      PT LONG TERM GOAL #1   Title decrease pain 50%   Time 8   Period Weeks   Status New     PT LONG TERM GOAL #2   Title go up and down stairs without difficulty   Time 8   Period Weeks   Status New     PT LONG TERM GOAL #3   Title get up from floor with minimal difficulty and pain   Time 8   Period Weeks   Status New     PT LONG TERM GOAL #4   Title return safely to the gym   Time 8   Period Weeks   Status New               Plan - 06/04/17 1105    Clinical Impression Statement Patient with good ability to do exercises where we limit her popping, she tends to have more pain with the longer angle motions   PT Next Visit Plan may continue with tape   Consulted and Agree with Plan of Care Patient      Patient will benefit from skilled therapeutic intervention in order to improve the following deficits and impairments:  Abnormal gait, Decreased activity tolerance, Decreased strength,  Increased edema, Improper body mechanics, Impaired flexibility, Pain, Difficulty walking  Visit Diagnosis: Acute pain of left knee  Difficulty in walking, not elsewhere classified     Problem List Patient Active Problem List   Diagnosis Date Noted  . Wegener's granulomatosis (granulomatosis with polyangiitis) ([place]) 01/24/2015  . Goiter, nontoxic, multinodular  per pt history  12/07/2014  . Sleep disorder 10/20/2014  . Injury of left foot 10/08/2014  . Varicose veins of lower extremities with complications 54/62/7035  . GERD (gastroesophageal reflux disease) 07/07/2014  . DJD (degenerative joint disease) 07/07/2014  . Family history of breast cancer in sister 07/07/2014  . Osteoporosis  on Reclast per Dr. Davonna Belling 07/06/2014  . Anxiety state 07/06/2014  . Insomnia  off BZP now  07/06/2014  . Migraine headache 07/06/2014  . Hx MRSA infection 07/06/2014  . 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

## 2017-07-08 ENCOUNTER — Telehealth: Payer: Self-pay | Admitting: Oncology

## 2017-07-08 NOTE — Telephone Encounter (Signed)
Lvm advising appt 11/5 moved to 11/26 at 11am per staff msg. Advised in voicemail that subsequent lab appts have also been moved and asked that she collect a new calendar on 11/26.

## 2017-07-09 ENCOUNTER — Other Ambulatory Visit: Payer: Self-pay

## 2017-07-30 ENCOUNTER — Other Ambulatory Visit: Payer: Self-pay

## 2017-08-06 ENCOUNTER — Other Ambulatory Visit (HOSPITAL_BASED_OUTPATIENT_CLINIC_OR_DEPARTMENT_OTHER): Payer: Medicare Other

## 2017-08-06 DIAGNOSIS — M3130 Wegener's granulomatosis without renal involvement: Secondary | ICD-10-CM | POA: Diagnosis not present

## 2017-08-06 DIAGNOSIS — D8989 Other specified disorders involving the immune mechanism, not elsewhere classified: Secondary | ICD-10-CM

## 2017-08-06 LAB — COMPREHENSIVE METABOLIC PANEL
ALT: 20 U/L (ref 0–55)
AST: 18 U/L (ref 5–34)
Albumin: 3.9 g/dL (ref 3.5–5.0)
Alkaline Phosphatase: 50 U/L (ref 40–150)
Anion Gap: 8 mEq/L (ref 3–11)
BUN: 17.3 mg/dL (ref 7.0–26.0)
CHLORIDE: 106 meq/L (ref 98–109)
CO2: 26 meq/L (ref 22–29)
Calcium: 9.9 mg/dL (ref 8.4–10.4)
Creatinine: 0.9 mg/dL (ref 0.6–1.1)
EGFR: >60 mL/min/{1.73_m2} (ref >60)
GLUCOSE: 62 mg/dL — AB (ref 70–140)
POTASSIUM: 4.7 meq/L (ref 3.5–5.1)
SODIUM: 140 meq/L (ref 136–145)
Total Bilirubin: 0.4 mg/dL (ref 0.20–1.20)
Total Protein: 7 g/dL (ref 6.4–8.3)

## 2017-08-06 LAB — CBC WITH DIFFERENTIAL/PLATELET
BASO%: 0.5 % (ref 0.0–2.0)
BASOS ABS: 0.1 10*3/uL (ref 0.0–0.1)
EOS ABS: 0.2 10*3/uL (ref 0.0–0.5)
EOS%: 1.7 % (ref 0.0–7.0)
HCT: 41.7 % (ref 34.8–46.6)
HGB: 13.5 g/dL (ref 11.6–15.9)
LYMPH%: 24.8 % (ref 14.0–49.7)
MCH: 29.8 pg (ref 25.1–34.0)
MCHC: 32.4 g/dL (ref 31.5–36.0)
MCV: 92.1 fL (ref 79.5–101.0)
MONO#: 0.9 10*3/uL (ref 0.1–0.9)
MONO%: 9.7 % (ref 0.0–14.0)
NEUT%: 63.3 % (ref 38.4–76.8)
NEUTROS ABS: 5.9 10*3/uL (ref 1.5–6.5)
PLATELETS: 317 10*3/uL (ref 145–400)
RBC: 4.53 10*6/uL (ref 3.70–5.45)
RDW: 13.2 % (ref 11.2–14.5)
WBC: 9.3 10*3/uL (ref 3.9–10.3)
lymph#: 2.3 10*3/uL (ref 0.9–3.3)

## 2017-09-18 ENCOUNTER — Telehealth: Payer: Self-pay

## 2017-09-18 NOTE — Telephone Encounter (Signed)
msg sent to schedulers to add an injection appt and appt to see GM on 10/29/17 per [name]

## 2017-10-08 ENCOUNTER — Other Ambulatory Visit: Payer: Self-pay

## 2017-10-19 ENCOUNTER — Encounter: Payer: Self-pay | Admitting: Oncology

## 2017-10-19 ENCOUNTER — Other Ambulatory Visit: Payer: Self-pay | Admitting: *Deleted

## 2017-10-19 DIAGNOSIS — M818 Other osteoporosis without current pathological fracture: Secondary | ICD-10-CM

## 2017-10-19 DIAGNOSIS — M3130 Wegener's granulomatosis without renal involvement: Secondary | ICD-10-CM

## 2017-10-22 ENCOUNTER — Inpatient Hospital Stay: Payer: Medicare Other | Attending: Oncology

## 2017-10-22 DIAGNOSIS — Z9221 Personal history of antineoplastic chemotherapy: Secondary | ICD-10-CM | POA: Insufficient documentation

## 2017-10-22 DIAGNOSIS — M818 Other osteoporosis without current pathological fracture: Secondary | ICD-10-CM

## 2017-10-22 DIAGNOSIS — M3130 Wegener's granulomatosis without renal involvement: Secondary | ICD-10-CM | POA: Diagnosis present

## 2017-10-22 DIAGNOSIS — Z79899 Other long term (current) drug therapy: Secondary | ICD-10-CM | POA: Insufficient documentation

## 2017-10-22 DIAGNOSIS — M810 Age-related osteoporosis without current pathological fracture: Secondary | ICD-10-CM | POA: Insufficient documentation

## 2017-10-22 LAB — CMP (CANCER CENTER ONLY)
ALBUMIN: 3.6 g/dL (ref 3.5–5.0)
ALK PHOS: 57 U/L (ref 40–150)
ALT: 16 U/L (ref 0–55)
AST: 18 U/L (ref 5–34)
Anion gap: 8 (ref 3–11)
BILIRUBIN TOTAL: 0.6 mg/dL (ref 0.2–1.2)
BUN: 17 mg/dL (ref 7–26)
CALCIUM: 9.7 mg/dL (ref 8.4–10.4)
CO2: 26 mmol/L (ref 22–29)
CREATININE: 0.87 mg/dL (ref 0.60–1.10)
Chloride: 106 mmol/L (ref 98–109)
GFR, Est AFR Am: >60 mL/min (ref >60)
GLUCOSE: 72 mg/dL (ref 70–140)
Potassium: 4.5 mmol/L (ref 3.5–5.1)
Sodium: 140 mmol/L (ref 136–145)
TOTAL PROTEIN: 6.7 g/dL (ref 6.4–8.3)

## 2017-10-22 LAB — CBC WITH DIFFERENTIAL (CANCER CENTER ONLY)
BASOS ABS: 0.1 10*3/uL (ref 0.0–0.1)
Basophils Relative: 1 %
EOS ABS: 0.2 10*3/uL (ref 0.0–0.5)
EOS PCT: 2 %
HCT: 40.9 % (ref 34.8–46.6)
Hemoglobin: 13.6 g/dL (ref 11.6–15.9)
Lymphocytes Relative: 25 %
Lymphs Abs: 2 10*3/uL (ref 0.9–3.3)
MCH: 29.9 pg (ref 25.1–34.0)
MCHC: 33.3 g/dL (ref 31.5–36.0)
MCV: 89.8 fL (ref 79.5–101.0)
MONO ABS: 0.7 10*3/uL (ref 0.1–0.9)
Monocytes Relative: 9 %
Neutro Abs: 5.1 10*3/uL (ref 1.5–6.5)
Neutrophils Relative %: 63 %
PLATELETS: 309 10*3/uL (ref 145–400)
RBC: 4.55 MIL/uL (ref 3.70–5.45)
RDW: 13.3 % (ref 11.2–14.5)
WBC: 8.1 10*3/uL (ref 3.9–10.3)

## 2017-10-29 ENCOUNTER — Other Ambulatory Visit: Payer: Self-pay

## 2017-10-29 ENCOUNTER — Ambulatory Visit: Payer: Self-pay

## 2017-10-29 ENCOUNTER — Ambulatory Visit: Payer: Self-pay | Admitting: Oncology

## 2017-10-30 ENCOUNTER — Telehealth: Payer: Self-pay | Admitting: Oncology

## 2017-10-30 ENCOUNTER — Inpatient Hospital Stay (HOSPITAL_BASED_OUTPATIENT_CLINIC_OR_DEPARTMENT_OTHER): Payer: Medicare Other | Admitting: Oncology

## 2017-10-30 ENCOUNTER — Encounter: Payer: Self-pay | Admitting: Oncology

## 2017-10-30 ENCOUNTER — Inpatient Hospital Stay: Payer: Medicare Other

## 2017-10-30 VITALS — BP 105/66 | HR 62 | Temp 97.7°F | Resp 17 | Ht 66.5 in | Wt 162.0 lb

## 2017-10-30 DIAGNOSIS — M810 Age-related osteoporosis without current pathological fracture: Secondary | ICD-10-CM

## 2017-10-30 DIAGNOSIS — Z79899 Other long term (current) drug therapy: Secondary | ICD-10-CM | POA: Diagnosis not present

## 2017-10-30 DIAGNOSIS — M3130 Wegener's granulomatosis without renal involvement: Secondary | ICD-10-CM

## 2017-10-30 DIAGNOSIS — Z9221 Personal history of antineoplastic chemotherapy: Secondary | ICD-10-CM | POA: Diagnosis not present

## 2017-10-30 MED ORDER — DENOSUMAB 60 MG/ML ~~LOC~~ SOLN
60.0000 mg | Freq: Once | SUBCUTANEOUS | Status: AC
  Administered 2017-10-30: 60 mg via SUBCUTANEOUS

## 2017-10-30 NOTE — Progress Notes (Signed)
ID: Theresa [name]  DOB: [birth year]  MR#: [id number]  CSN#: [id number]  Patient Care Team: Antony Contras, MD as PCP - General (Family Medicine) Brand Males, MD as Consulting Physician (Pulmonary Disease) Delrae Rend, MD as Consulting Physician (Endocrinology) Magrinat, Virgie Dad, MD as Consulting Physician (Oncology) Ernestine Conrad, MD as Referring Physician (Otolaryngology) Clinger, Chrystie Nose, MD (Otolaryngology) Pedro Earls, MD as Attending Physician (Family Medicine) OTHER MD: [name] (fax [phone]); Jaquita Rector; Danton Sewer; Kandis Ban; [name] (fax [phone]); Huey Romans, Murali Ramaswami  Interval History:   [name] returns today for 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  She continues to be followed through the [facility], and her next meeting there will be in May of this year.  She has also established herself with Dr. Zenia Resides at [facility] in rheumatology.  These as currently struggling with Plaquenil.  Since her last visit she had a thyroid biopsy ([id number]) with results showing: Thyroid, fine needle aspiration, LMP (Specimen 1 of 1, collected 04/03/17): Consistent with benign follicular nodule (Bethesda category II)   ROS:  Briannon reports that she is currently going through some personal issues. She saw Dr. Zenia Resides due to joint pain. She notes that she is having side affects from taking Plaquenil. She had stomach aches and dry, itchy hives on the skin. Her mouth and throat were also dry. She is now taking it every other day, which has helped decrease the skin rashes. She also takes water aerobics classes to aid with the dry skin. [facility]. She is trying to eat healthier and cut out sugars and carbohydrates, but she feels that she is not losing any weight. Her weight fluctuates within a 5 lbs range on weekly basis. She follows up with her PCP regarding soreness and pain in her bilateral knees. She  receives gel shots with Dr. Elmyra Ricks. She also completed physical therapy for this in the past. She denies unusual headaches, visual changes, nausea, vomiting, or dizziness. There has been no unusual cough, phlegm production, or pleurisy. This been no change in bowel or bladder habits. She denies unexplained fatigue or unexplained weight loss, bleeding, rash, or fever. A detailed review of systems was otherwise stable.    History of wegener's granulomatosis:  [name] tells me her diagnosis of Wegener was initially made at the [facility] by Dr. [name] in 1999.  She has been treated in the past with Cytoxan and prednisone, methotrexate very briefly, Cytoxan orally, then Imuran for some time, and then off treatment for some years.  She was started on prednisone alone in 2009, and then in 06/2008 the azathioprine was added again at 150 mg daily, and variable doses of prednisone.  It is felt that she failed remission maintenance therapy since she could not reduce her dose of prednisone below 20 mg daily without having a resurgence of symptoms, and therefore she has been started on Rituxan with the first dose given at the [facility] on 06/24.  The patient tolerated the treatment quite well.  Her subsequent history is as detailed below  PMHx: Associated with the Wegener, the past medical history is significant for prior tracheostomy, prior sinus surgery x 2, prior right tear duct surgery at [facility] for epiphora (with subsequent re-obstruction).  She also has a history of nasal saddling and inflammation left greater than right.  She also gets occasional thrush problems because of the steroid treatment.  Aside from the Wegener, she has  a history of osteoporosis, history of migraines, history of GERD, history of mitral valve prolapse, and a history of MRSA infection remotely (1999).    Allergies  Allergen Reactions  . Codeine Itching  . Eletriptan Nausea Only  . Vancomycin Hives  . Tape Rash     Current Outpatient Medications  Medication Sig Dispense Refill  . albuterol (PROVENTIL HFA;VENTOLIN HFA) 108 (90 BASE) MCG/ACT inhaler Inhale 2 puffs into the lungs every 6 (six) hours as needed. For shortness of breath or wheezing    . esomeprazole (NEXIUM) 40 MG capsule Take 40 mg by mouth daily as needed.     . fluticasone (FLONASE) 50 MCG/ACT nasal spray Place 2 sprays into the nose daily.    . fluticasone (FLOVENT HFA) 110 MCG/ACT inhaler Inhale 2 puffs into the lungs daily.    . Hypertonic Nasal Wash (SINUS RINSE BOTTLE KIT NA) Place into the nose.    Marland Kitchen LORazepam (ATIVAN) 1 MG tablet Take 1 tablet (1 mg total) by mouth 2 (two) times daily as needed for anxiety. 60 tablet 1  . Melatonin 5 MG TABS Take by mouth.    . Multiple Vitamin (MULTIVITAMIN) tablet Take 1 tablet by mouth daily.    . Multiple Vitamins-Minerals (PRESERVISION AREDS 2 PO) Take by mouth.    . predniSONE (DELTASONE) 5 MG tablet Take 4 mg by mouth daily with breakfast.     . sulfamethoxazole-trimethoprim (BACTRIM,SEPTRA) 400-80 MG tablet Take 1 tablet by mouth daily.    Marland Kitchen zolpidem (AMBIEN) 10 MG tablet Take one tablet hs prn.  Do not take with Ativan 30 tablet 1   No current facility-administered medications for this visit.     FAMILY HISTORY:  The patient's father is alive in his early 80s.  The patient's mother is alive in her late 105s.  The patient has 3 sisters.  There is no one else in the family with autoimmune disease or with cancer.  GYNECOLOGIC HISTORY:  She is GX, P2, first pregnancy to term age 36.  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  He is living in [place] and has remarried.  The patient's son  [name], currently in [place] and  a second child, [name], is going to [place] state .  The patient attends [employer] here in [place].   Objective: Middle-aged white woman who appears stated age  57:   10/30/17 1009  BP: 105/66  Pulse: 62  Resp: 17  Temp: 97.7 F (36.5 C)  SpO2: 99%    BMI: Body mass index is 25.76 kg/m.   ECOG FS: 1  Sclerae unicteric, pupils round and equal Oropharynx clear and moist No cervical or supraclavicular adenopathy Lungs no rales or rhonchi; coughs through the exam Heart regular rate and rhythm Abd soft, nontender, positive bowel sounds MSK no focal spinal tenderness, no upper extremity lymphedema Neuro: nonfocal, well oriented, appropriate affect Breasts: Deferred     Lab Results:    Chemistry      Component Value Date/Time   NA 140 10/22/2017 1001   NA 140 08/06/2017 1335   K 4.5 10/22/2017 1001   K 4.7 08/06/2017 1335   CL 106 10/22/2017 1001   CL 106 11/07/2012 0916  CO2 26 10/22/2017 1001   CO2 26 08/06/2017 1335   BUN 17 10/22/2017 1001   BUN 17.3 08/06/2017 1335   CREATININE 0.87 10/22/2017 1001   CREATININE 0.9 08/06/2017 1335      Component Value Date/Time   CALCIUM 9.7 10/22/2017 1001   CALCIUM 9.9 08/06/2017 1335   ALKPHOS 57 10/22/2017 1001   ALKPHOS 50 08/06/2017 1335   AST 18 10/22/2017 1001   AST 18 08/06/2017 1335   ALT 16 10/22/2017 1001   ALT 20 08/06/2017 1335   BILITOT 0.6 10/22/2017 1001   BILITOT 0.40 08/06/2017 1335       Lab Results  Component Value Date   WBC 8.1 10/22/2017   HGB 13.5 08/06/2017   HCT 40.9 10/22/2017   MCV 89.8 10/22/2017   PLT 309 10/22/2017   NEUTROABS 5.1 10/22/2017    Studies/Results: No results found.   Assessment: 57 y.o.  [place] woman with a history of Wegener's granulomatosis initially diagnosed in 1999, variously treated as noted above, s/p Rituxan x4 completed July 2010, currently on low-dose prednisone maintenance.  (1) osteoporosis, treated on alendronate between 2002 and 2005,  ibandronate between 2005 in 2008, then risedronate between 2008 and 2011, first dose of Reclast/zolendronate 10/24/2013  (a) DEXA scan 12/22/2012 lowest reading -2.8 at the spine, -2.1 of the femoral neck  (b) repeat bone density 2017 show worsening T score-- zolendronate discontinued 01/18/2015  (c) denosumab/Prolia started 05/02/2016  Plan: Amandine is struggling currently with her medical conditions.  It does not help that there are significant financial issues relating to her former husband having remarried having to go back to court for [place] and other issues.  I have reassured her that we will do whatever we can to facilitate her situation from a financial point of view and I scheduled her to meet with our patient advocate today.  I am also requesting an appointment with Dr. Lynford Citizen  Otherwise she will continue to have her labs checked every 3 months here, followed through the [facility], and she will receive denosumab/Prolia today and in August and then again next February when she will see me again  She knows to call for any other issues that may develop before the next visit.   Magrinat, Virgie Dad, MD  10/30/17 10:29 AM Medical Oncology and Hematology [facility] [address] [place], [place] [address] Tel. [phone]    Fax. [phone]    This document serves as a record of services personally performed by Lurline Del, MD. It was created on his behalf by Sheron Nightingale, a trained medical scribe. The creation of this record is based on the scribe's personal observations and the provider's statements to them.   I have reviewed the above documentation for accuracy and completeness, and I agree with the above.

## 2017-10-30 NOTE — Telephone Encounter (Signed)
Gave avs and calendar for august  °

## 2017-10-30 NOTE — Patient Instructions (Signed)
Denosumab injection  What is this medicine?  DENOSUMAB (den oh sue mab) slows bone breakdown. Prolia is used to treat osteoporosis in women after menopause and in men. Xgeva is used to prevent bone fractures and other bone problems caused by cancer bone metastases. Xgeva is also used to treat giant cell tumor of the bone.  This medicine may be used for other purposes; ask your health care provider or pharmacist if you have questions.  What should I tell my health care provider before I take this medicine?  They need to know if you have any of these conditions:  -dental disease  -eczema  -infection or history of infections  -kidney disease or on dialysis  -low blood calcium or vitamin D  -malabsorption syndrome  -scheduled to have surgery or tooth extraction  -taking medicine that contains denosumab  -thyroid or parathyroid disease  -an unusual reaction to denosumab, other medicines, foods, dyes, or preservatives  -pregnant or trying to get pregnant  -breast-feeding  How should I use this medicine?  This medicine is for injection under the skin. It is given by a health care professional in a hospital or clinic setting.  If you are getting Prolia, a special MedGuide will be given to you by the pharmacist with each prescription and refill. Be sure to read this information carefully each time.  For Prolia, talk to your pediatrician regarding the use of this medicine in children. Special care may be needed. For Xgeva, talk to your pediatrician regarding the use of this medicine in children. While this drug may be prescribed for children as young as 13 years for selected conditions, precautions do apply.  Overdosage: If you think you have taken too much of this medicine contact a poison control center or emergency room at once.  NOTE: This medicine is only for you. Do not share this medicine with others.  What if I miss a dose?  It is important not to miss your dose. Call your doctor or health care professional if you are  unable to keep an appointment.  What may interact with this medicine?  Do not take this medicine with any of the following medications:  -other medicines containing denosumab  This medicine may also interact with the following medications:  -medicines that suppress the immune system  -medicines that treat cancer  -steroid medicines like prednisone or cortisone  This list may not describe all possible interactions. Give your health care provider a list of all the medicines, herbs, non-prescription drugs, or dietary supplements you use. Also tell them if you smoke, drink alcohol, or use illegal drugs. Some items may interact with your medicine.  What should I watch for while using this medicine?  Visit your doctor or health care professional for regular checks on your progress. Your doctor or health care professional may order blood tests and other tests to see how you are doing.  Call your doctor or health care professional if you get a cold or other infection while receiving this medicine. Do not treat yourself. This medicine may decrease your body's ability to fight infection.  You should make sure you get enough calcium and vitamin D while you are taking this medicine, unless your doctor tells you not to. Discuss the foods you eat and the vitamins you take with your health care professional.  See your dentist regularly. Brush and floss your teeth as directed. Before you have any dental work done, tell your dentist you are receiving this medicine.  Do   not become pregnant while taking this medicine or for 5 months after stopping it. Women should inform their doctor if they wish to become pregnant or think they might be pregnant. There is a potential for serious side effects to an unborn child. Talk to your health care professional or pharmacist for more information.  What side effects may I notice from receiving this medicine?  Side effects that you should report to your doctor or health care professional as soon as  possible:  -allergic reactions like skin rash, itching or hives, swelling of the face, lips, or tongue  -breathing problems  -chest pain  -fast, irregular heartbeat  -feeling faint or lightheaded, falls  -fever, chills, or any other sign of infection  -muscle spasms, tightening, or twitches  -numbness or tingling  -skin blisters or bumps, or is dry, peels, or red  -slow healing or unexplained pain in the mouth or jaw  -unusual bleeding or bruising  Side effects that usually do not require medical attention (Report these to your doctor or health care professional if they continue or are bothersome.):  -muscle pain  -stomach upset, gas  This list may not describe all possible side effects. Call your doctor for medical advice about side effects. You may report side effects to FDA at 1-800-FDA-1088.  Where should I keep my medicine?  This medicine is only given in a clinic, doctor's office, or other health care setting and will not be stored at home.  NOTE: This sheet is a summary. It may not cover all possible information. If you have questions about this medicine, talk to your doctor, pharmacist, or health care provider.      2016, Elsevier/Gold Standard. (2012-02-19 12:37:47)

## 2017-11-20 IMAGING — US US THYROID
1 series · 13 of 25 positions shown · non-contrast
Comparison: 07/27/2009 by report only

CLINICAL DATA: Thyroid nodules if

EXAM:
THYROID ULTRASOUND
TECHNIQUE: Ultrasound examination of the thyroid gland and adjacent soft
tissues was performed.

[Series 1: us thyroid · 0.05mm/px · 13 of 71 slices shown]
[im 1/71]
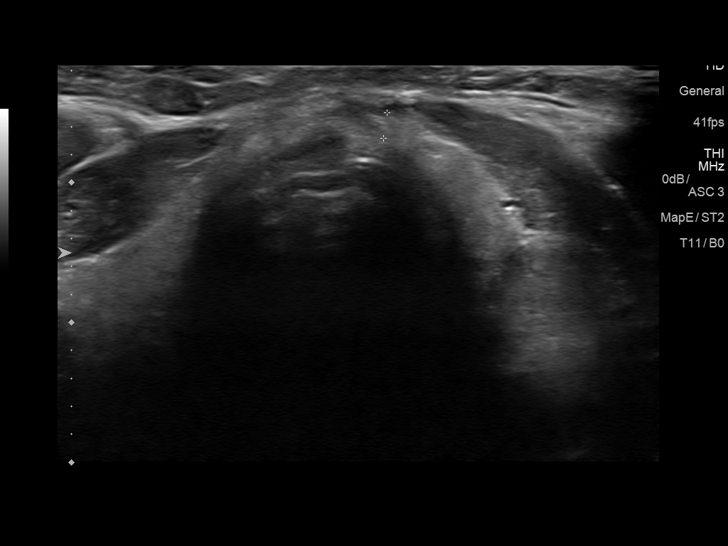
[im 6/71]
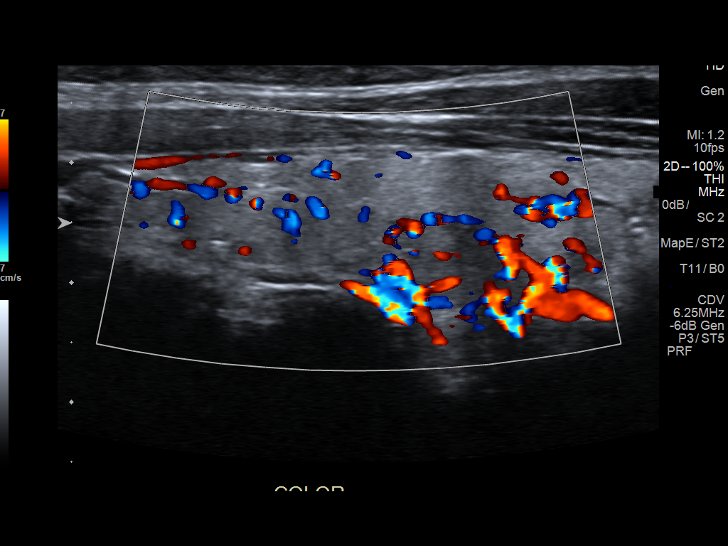
[im 12/71]
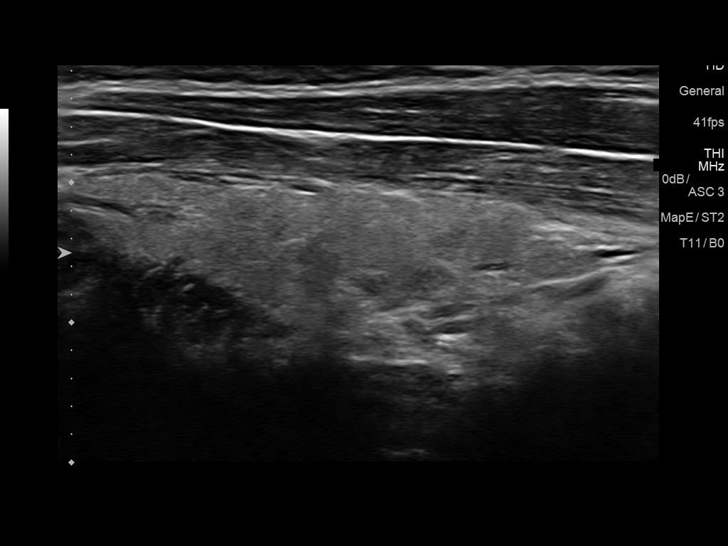
[im 18/71]
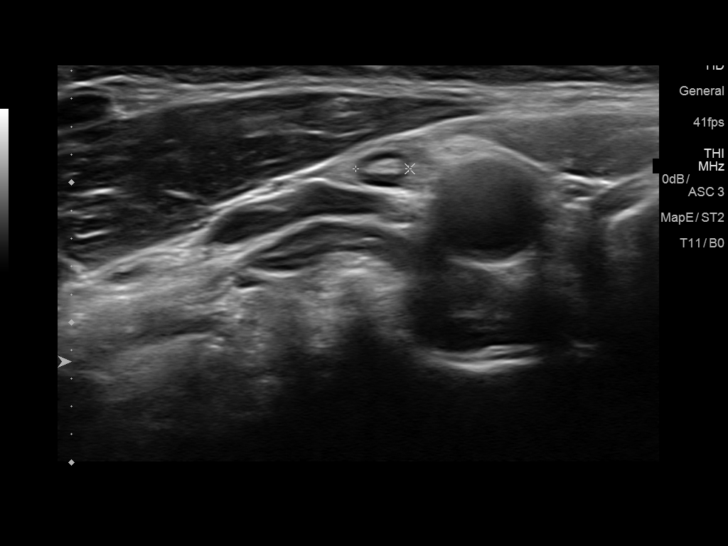
[im 24/71]
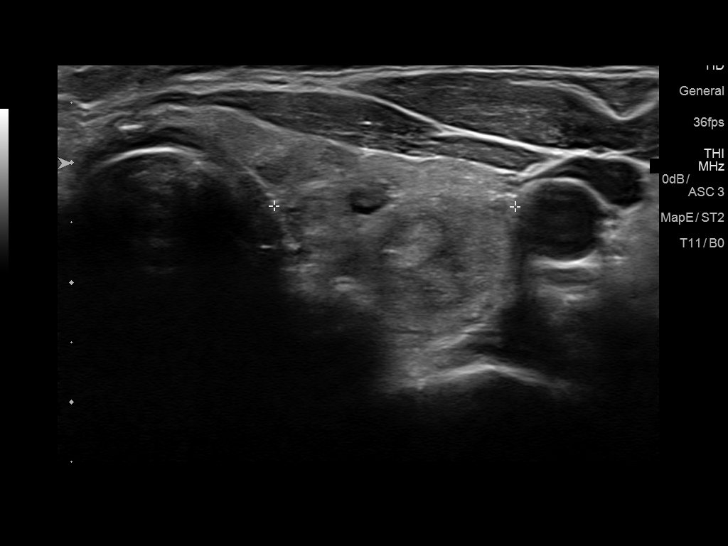
[im 30/71]
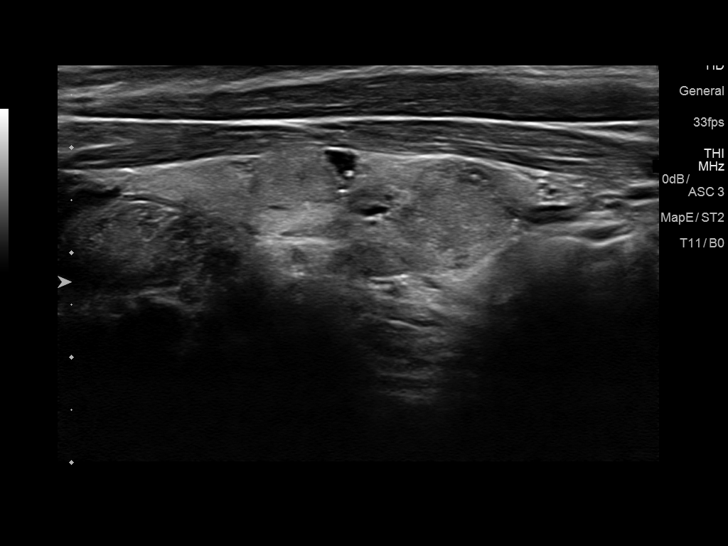
[im 36/71]
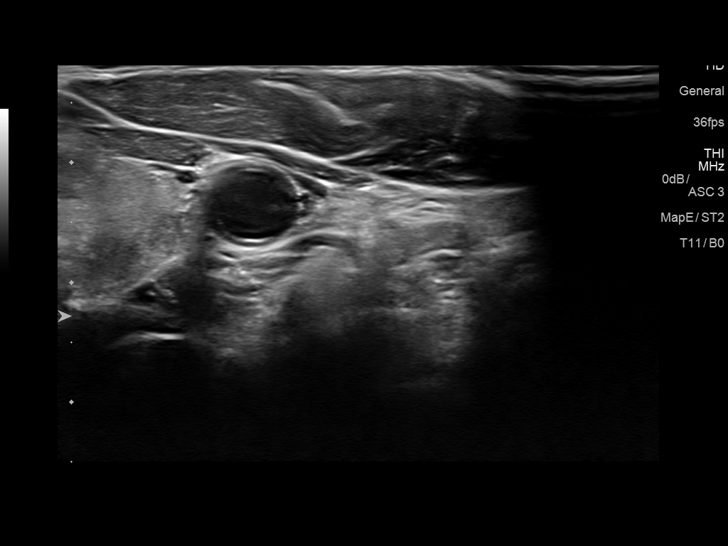
[im 41/71]
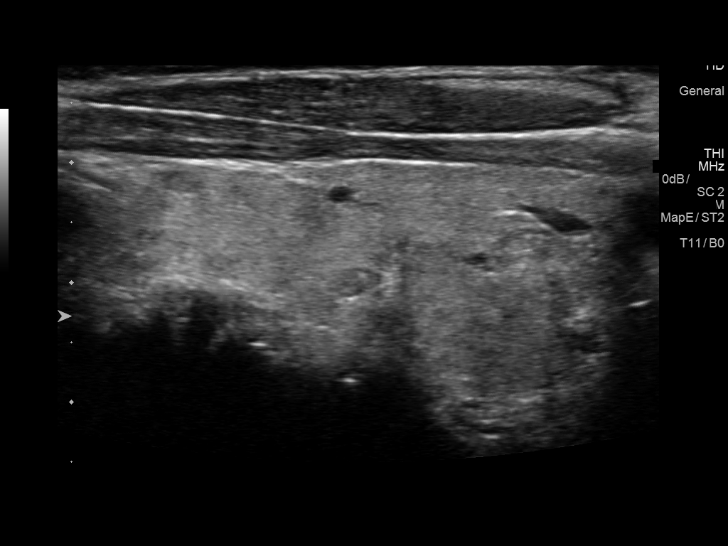
[im 47/71]
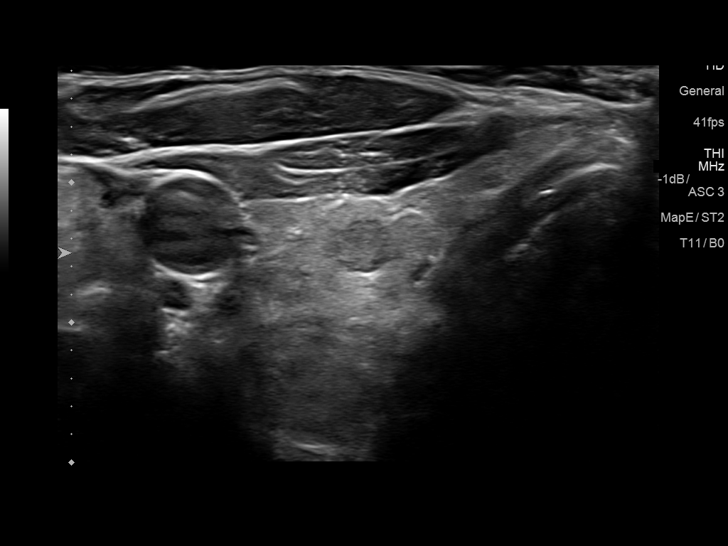
[im 53/71]
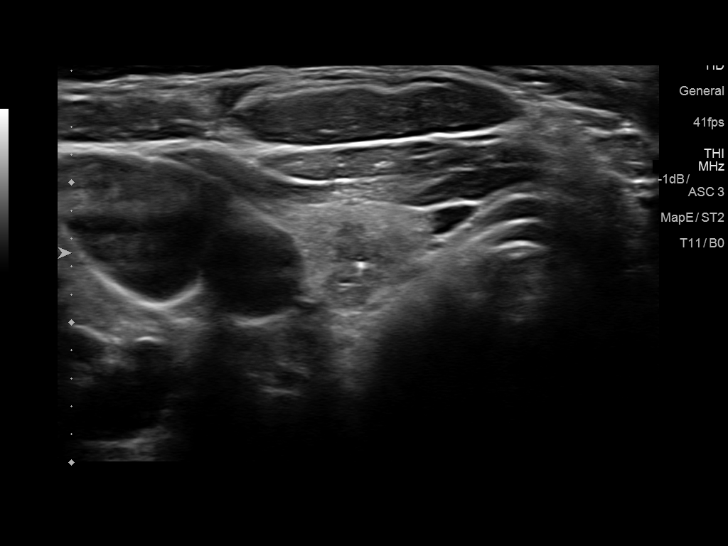
[im 59/71]
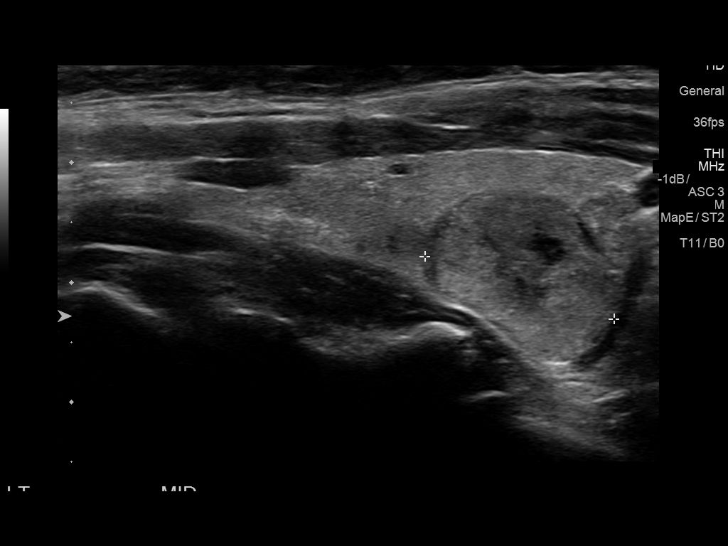
[im 65/71]
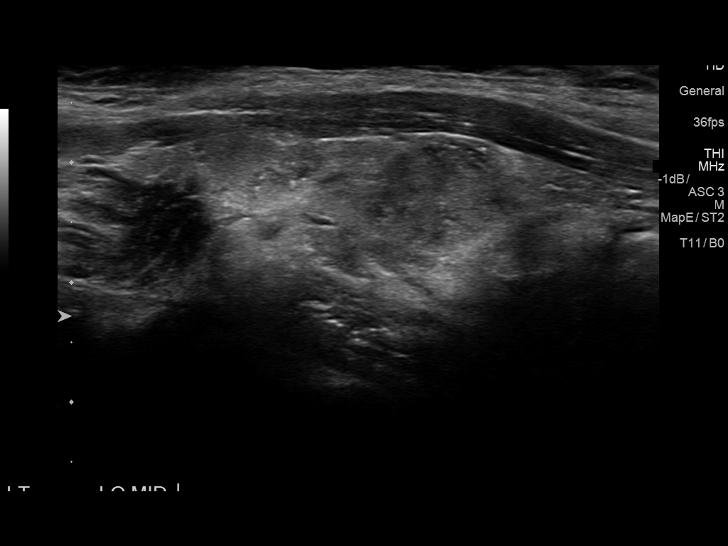
[im 71/71]
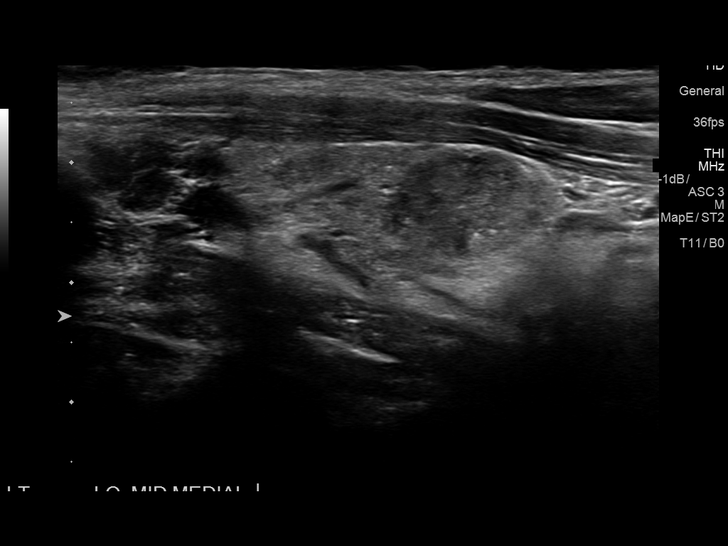

[13 of 25 positions shown; findings below may reference images not displayed]

FINDINGS: Parenchymal Echotexture: Mildly heterogenous

Isthmus: 0.2 cm thickness

Right lobe: 5 x 1.4 x 1.5 cm (previously reported 5.7 x 1.5 x 2.1)

Left lobe: 4.2 x 1.5 x 2 cm (previously 4.6 x 1.8 x 1.8)

_________________________________________________________

Estimated total number of nodules >/= 1 cm: 3

Number of spongiform nodules >/=  2 cm not described below (TR1): 0

Number of mixed cystic and solid nodules >/= 1.5 cm not described
below (TR2): 0

Nodule # 1:

Location: Left; Superior

Maximum size: 1 cm; Other 2 dimensions: 0.5 x 0.8 cm

Composition: solid/almost completely solid (2)

Echogenicity: hypoechoic (2)

Shape: not taller-than-wide (0)

Margins: ill-defined (0)

Echogenic foci: macrocalcifications (1)

ACR TI-RADS total points: 5.

ACR TI-RADS risk category: TR4 (4-6 points).

ACR TI-RADS recommendations:

*Given size (>/= 1 - 1.4 cm) and appearance, a follow-up ultrasound
in 1 year should be considered based on TI-RADS criteria.

Nodule # 2:

Location: Left; Mid

Maximum size: 1.7 cm; Other 2 dimensions: 1.3 x 1.1 cm

Composition: solid/almost completely solid (2)

Echogenicity: hypoechoic (2)

Shape: taller-than-wide (3)

Margins: smooth (0)

Echogenic foci: none (0)

ACR TI-RADS total points: 7.

ACR TI-RADS risk category: TR5 (>/= 7 points).

ACR TI-RADS recommendations:

**Given size (>/= 1.0 cm) and appearance, fine needle aspiration of
this highly suspicious nodule should be considered based on TI-RADS
criteria.

Nodule # 3:

Location: Left; Mid medially

Maximum size: 1.3 cm; Other 2 dimensions: 0.8 x 0.8 cm

Composition: solid/almost completely solid (2)

Echogenicity: hypoechoic (2)

Shape: not taller-than-wide (0)

Margins: smooth (0)

Echogenic foci: punctate echogenic foci (3)

ACR TI-RADS total points: 7.

ACR TI-RADS risk category: TR5 (>/= 7 points).

ACR TI-RADS recommendations:

**Given size (>/= 1.0 cm) and appearance, fine needle aspiration of
this highly suspicious nodule should be considered based on TI-RADS
criteria.

At least 3 right lobe hypoechoic nodules without calcifications are
identified, all less than 1 cm.
IMPRESSION: 1. Mild thyromegaly with bilateral nodules.
2. Recommend FNA biopsy of 2 highly suspicious mid left nodules as
above.
3. Recommend 1 year follow-up ultrasound of superior left 1 cm
lesion.

The above is in keeping with the ACR TI-RADS recommendations - [HOSPITAL] 3979;[DATE].

## 2017-12-28 ENCOUNTER — Ambulatory Visit: Payer: Medicare Other | Admitting: Internal Medicine

## 2017-12-28 ENCOUNTER — Encounter: Payer: Self-pay | Admitting: Internal Medicine

## 2017-12-28 VITALS — BP 116/70 | HR 75 | Ht 66.5 in | Wt 157.8 lb

## 2017-12-28 DIAGNOSIS — M3130 Wegener's granulomatosis without renal involvement: Secondary | ICD-10-CM | POA: Diagnosis not present

## 2017-12-28 DIAGNOSIS — R05 Cough: Secondary | ICD-10-CM

## 2017-12-28 DIAGNOSIS — R053 Chronic cough: Secondary | ICD-10-CM

## 2017-12-28 LAB — NITRIC OXIDE: NITRIC OXIDE: 26

## 2017-12-28 MED ORDER — MOMETASONE FUROATE 100 MCG/ACT IN AERO: 2.0000 | INHALATION_SPRAY | Freq: Two times a day (BID) | RESPIRATORY_TRACT | 0 refills | Status: DC

## 2017-12-28 NOTE — Progress Notes (Signed)
Subjective:     Patient ID: Theresa [name], female   DOB: [birth year], 57 y.o.   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      57 year old female. 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  CAre direction is from Dr [name] at [facility] in [place], [place]. Main reason she is here she wants local pulmonologist back up  Reports that in  1998 when pregnant with daughter she developed severe headaches and sinusitis. She recollects mulitple inteventions and Rx prior sinus surgery x 2, prior right tear duct surgery at [facility] for epiphora (with subsequent re-obstruction). She also has a history of nasal saddling and inflammation left greater than right.  And biopsies with no diagnosis. In 1999 had upper airway obstruction and underwent emergency trach at [facility] (Dr [name]). Other interventions include:  Resuled in visit to [facility] and Dr [name] making diagnosis of GPA.   She has been treated in the past with Cytoxan and prednisone, methotrexate very briefly, Cytoxan orally, then Imuran for some time, and then off treatment for some years. She was started on prednisone alone in 2009, and then in 06/2008 the azathioprine was added again at 150 mg daily, and variable doses of prednisone. Then in end 2013, early 2013 had relapse and needed rituxan and was felt  She could not go below prednisone 57m per day. [facility]  [facility]oses with Dr  MJana Hakimearly 2013.  Aside from the Wegener, she has a history of osteopenia, history of migraines, history of GERD, history of mitral valve prolapse, and a history of MRSA infection remotely (1999).   In 2014: but it appers disease in stable/remission with prednisone and in 2014: she was able to get disability. In May 2015 She followed with Dr MJana Hakim disease was felt to be stable. HEr main issues were fatigue, insomnia , anxiety, and mild anxiety. SHe has clarified that overall she has handled lif stressors well and does not suffer from anxiety disorder per se. IF atll anxiety is mild and only situational  She is now referred because she wants a local Rx team. She [facility]. [facility] expensive to [facility] there. So [facility]. She is making a trip up there in August 2015 and will see if she can continue going there but she will likely look for increasing Rx decisions from [place]  [facility] and [facility] pulmonary admit her if she became critically ill. She wants airway support in [place] we discussed local ENT docs. For now, she wants to hold off establishing someone as OPD. She is aware that if critical, ER and PCCM will coordinate that.  She also wants me to be her resouruce locally  I have agreed to her care plan     Smoking hx  reports that she has never smoked. She has never used smokeless tobacco.   CXR Oct 2012   RADIOLOGY REPORT*  Clinical Data: Cough for 5 days. Wegener's granulomatosis.  CHEST - 2 VIEW  Comparison: None.  Findings: Emphysema is present with flattening of hemidiaphragms  and enlargement retrosternal clear space. Bronchitic changes are  present in the lower lobes bilaterally. Cardiopericardial  silhouette and mediastinal contours appear within normal limits.  Vertebral body height is  preserved. No destructive osseous lesions  are identified. No plain film evidence of adenopathy.  IMPRESSION:  Hyperinflation consist with emphysema. No active cardiopulmonary  disease.  Original Report Authenticated By: Dereck Ligas, M.D.    Ok Edwards 06/18/2014  Chief Complaint  Patient presents with  . Follow-up    Pt state since last OV pt states she is feeling better. Pt states she is going to [facility] in November. Pt denies SOB, cough and CP/tightness.     Followup GPA   - She has postponed trip to [facility] due to father and father in law being sick with cancer. She did have soe headache, MRI Was normal per hx and headaches attributed to ativan weaning (she is tyrin to get herself off ativan which she has been on for decades). Shee feels wegner under remission. No hematuria or respiratory issues. Will defer labs to [facility] visit. Recent PR-3 aparently up a point. She has not had PREVNAR and will have it 06/18/2014   She did clarify to me that plan in case of airway emergency is NOT for [place] as indicated in visit June 2015 but [place] where she has new team (pripr docs have retired)     Past: She is trying to wean herself off Valier 01/22/2015  Chief Complaint  Patient presents with  . Follow-up    [facility]. Pt stated her breathing is doing well. Pt c/o mild prod cough with clear mucus. Pt denies CP/tightness.     Follow-up GPA  Last seen October 2015. After that she went for follow-up to [facility] in [place], [place]. Overall she's doing well. No respiratory issues. Occasionally there is some voice change. This is due to subglottic stenosis. [facility]: Nov 2015 patient is concerned about the fact her B cells are low. She wants to know what this means. I will try to get in touch with  [facility]  Drs [name] attending or Dr Regan Lemming ? Fellow to answer this questionENT at [facility] her noted to have minimal  erythema at right nare but stable subglottic area without inflammation ENT at [facility] evaluated her noted to have minimal erythema at right nare but stable subglottic area without inflammation. At that visit Nov 2015 - per notes  B Cell pending but PR-3 ANCA is 0.2.  Ovverall disease GPA was felt to be in remission  Their recommendaton 1) . She also asked them about coming off prednisone. They did not that she has had prednisoen burst 4-5 ties between her visit with them April 2014 and Nov 2015 with last burst in may 2015. They have maintained equipose in recommending if she can come off prednisone but are ok wit her trying to come off which she has done at this point. 2)  monitor Pr-3 Q3 months 3) continue bactrim 1 tab daily ( I do not see it in her [facility] today may 2016  visit)   [facility] 07[facility]10[facility]2014 notes reviewed and things I learned are  - GPA diagnosed June 1998 due ing 2nd prednancy.   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 s/p induction rituxan with subsequent slow to reconstitute and maintained on prednisone 53m daily which seems to control her symptoms  And since then ANCA- negative  - April 2013: BReno1 but ANCA negative (was positive prior to rituxan) - April 2014: Last seen by Dr SAdan Sisprior to Nov 2015 visit. PR-3 anca negative  this time - Rx Bactrim 1DS BID. Risk for relapse considered low  Past medical history since I las saw her - Got her second reclast  infusion recently and apparently this infusion was too fast and she had some side effects and intolerance - She has tennis elbow on right side  - She also has a left fourth toe bruising/fracture being managed conservatively - tried to wean off ativan (chronic) but did not work  1) Pred started in 1998/1999 - was off of it for about 1 year in the last 18 years but has been consistently on it since beginning.  2) Pt was started in Bactrim in 1999 - has been on this daily since then at 4010mdaily. Pt was on 80089mut decreased to 400m46mt was told this keeps her PNA at bay.[place] ANCA is being managed by Dr MagrJana Hakimthe [facility]. Pt receives a lab kit in them mail, [facility] this is drawn the kit is mailed back off.    OV 09/14/2015  Chief Complaint  Patient presents with  . Follow-up    Pt recently fractured her left foot. Pt states she recently had BIL varicose vein surgery. Pt c/o prod cough with green mucus. Pt denies CP/tightness and SOB.    Follow-up granulomatous polyangiitis  Six-month follow-up. In interim has fractured her left foot particularly the left fifth metatarsal and has been in acast for the last few months/several weeks. She is under the care of Dr. [place]ilah [place] has a new primary care physician. She feels vasculitis is in remission. [facility] and current follow-up is on hold until the cast comes off which he hopes this today. After this [facility]. Because of the fracture which is been deemed due to osteoporosis prednisone has been cut down to 4 mg per day by her and new endocrinologist Dr. JeffDagmar Hait this point in time she has a cough which is baseline for the winter but she does  not feel she is infected and overall feels vasculitis is in remission.  OV 12/28/2017  Chief Complaint  Patient presents with  . Follow-up    Last seen 09/14/15. Pt states oncologist wanted pt to come make a visit with MR. States she has been coughing a lot with clear to yellow mucus. pt stated she had the flu and bacterial infection beginning of April 2019.   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  It is been over 2 years since I last saw her.  In the interim in May 2018 had follow-up visit at [facility].  She reports that she saw Dr. Claire Shown national expert and this condition.  She was advised that her B cells has still undetectable/very mildly detectable.  So she continues on Bactrim.  She is on observation therapy.  Her disease is being considered [burnt out/remission].  She saw ENT Dr. Shawn Stall.  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  She states that even [facility] is advised her that only Dr. Shawn [facility]all at [facility] can do the surgery.  Her next visit to [facility] in [place] is scheduled somewhere between May and July 2019.  She is trying to figure out the finances for her trip now that she separated from her husband and their financial issues.  From a pulmonary perspective she reports that 3 or 4 months ago she tried to wean herself off her acid reflux medications and prednisone and when she did that successfully her cough got worse/started - new issue for me.  She was seen at [facility] for worsening arthralgia.  Prescribed Plaquenil but this did not go well with her.  She still is remaining off the Plaquenil.  Then approximately 3 or 4 weeks ago early part of April 2019 she had a bronchitis episode with colored sputum suspected influenza and was given antibiotics at this point in time the cough got worse.  She  then reintroduced her acid reflux medications of PPI and H2 blockade.  After this and the treatment of her bronchitis her cough has gotten better but still not back to full baseline.  From a energy perspective she is doing overall better.  Of note last year at [facility] her ANCA antibodies were negative  FeNO 12/28/2017 - 26 ppb and mikdly elevated in grey zone     has a past medical history of Anxiety, Arthritis, GERD (gastroesophageal reflux disease), Headaches, cluster, MRSA infection, Mitral valve prolapse, Osteoporosis, Varicose veins, and Wegener's granulomatosis ([place]).   reports that she has never smoked. She has never used smokeless tobacco.  Past Surgical History:  Procedure Laterality Date  . AIRWAY FOREIGN BODY REMOVAL    . ENDOVENOUS ABLATION SAPHENOUS VEIN W/ LASER Left 06-03-2015   endovenous laser ablation left greater saphenous vein and stab phlebectomy 10-20 incisions left leg by Curt Jews MD  . ENDOVENOUS ABLATION SAPHENOUS VEIN W/ LASER Right 06-24-2015   endovenous laser ablation right greater saphenous vein and stab phlebectomy 10-20 incisions   . EYE SURGERY    . mitral valve prolaspe    . NASAL SINUS SURGERY    . tear duct surgery    . TRACHEOSTOMY CLOSURE      Allergies  Allergen Reactions  . Codeine Itching  . Eletriptan Nausea Only  . Plaquenil [Hydroxychloroquine Sulfate] Other (See Comments)    itching  . Vancomycin Hives  . Tape Rash    Immunization History  Administered Date(s)  Administered  . Hep B / HiB 11/02/2013  . Influenza Split 06/04/2013, 05/19/2014  . Influenza,inj,Quad PF,6+ Mos 07/20/2015  . Influenza-Unspecified 07/20/2015, 06/07/2017  . Pneumococcal Conjugate-13 06/18/2014    Family History  Problem Relation Age of Onset  . Asthma Daughter   . Asthma Sister   . Cancer Sister   . Breast cancer Sister   . CAD Father   . Skin cancer Father   . Heart disease Father   . Osteoporosis Father   . COPD Father   . Prostate  cancer Father   . Breast cancer Sister   . Hypertension Mother   . Osteoporosis Mother      Current Outpatient Medications:  .  albuterol (PROVENTIL HFA;VENTOLIN HFA) 108 (90 BASE) MCG/ACT inhaler, Inhale 2 puffs into the lungs every 6 (six) hours as needed. For shortness of breath or wheezing, Disp: , Rfl:  .  esomeprazole (NEXIUM) 40 MG capsule, Take 40 mg by mouth daily as needed. , Disp: , Rfl:  .  fluticasone (FLONASE) 50 MCG/ACT nasal spray, Place 2 sprays into the nose daily., Disp: , Rfl:  .  fluticasone (FLOVENT HFA) 110 MCG/ACT inhaler, Inhale 2 puffs into the lungs daily., Disp: , Rfl:  .  Hypertonic Nasal Wash (SINUS RINSE BOTTLE KIT NA), Place into the nose., Disp: , Rfl:  .  LORazepam (ATIVAN) 1 MG tablet, Take 1 tablet (1 mg total) by mouth 2 (two) times daily as needed for anxiety., Disp: 60 tablet, Rfl: 1 .  Multiple Vitamins-Minerals (PRESERVISION AREDS 2 PO), Take by mouth., Disp: , Rfl:  .  sulfamethoxazole-trimethoprim (BACTRIM,SEPTRA) 400-80 MG tablet, Take 1 tablet by mouth daily., Disp: , Rfl:  .  zolpidem (AMBIEN) 10 MG tablet, Take one tablet hs prn.  Do not take with Ativan, Disp: 30 tablet, Rfl: 1    Review of Systems     Objective:   Physical Exam  Vitals:   12/28/17 1025  BP: 116/70  Pulse: 75  SpO2: 97%  Weight: 157 lb 12.8 oz (71.6 kg)  Height: 5' 6.5" (1.689 m)    Estimated body mass index is 25.09 kg/m as calculated from the following:   Height as of this encounter: 5' 6.5" (1.689 m).   Weight as of this encounter: 157 lb 12.8 oz (71.6 kg).   General Appearance:    Looks well  Head:    Normocephalic, without obvious abnormality, atraumatic  Eyes:    PERRL - yes, conjunctiva/corneas -  clear      Ears:    Normal external ear canals, both ears  Nose:   NG tube - no  Throat:  ETT TUBE - no , OG tube - no - prior trach scar. Mild basal raspy voice  Neck:   Supple,  No enlargement/tenderness/nodules     Lungs:     Clear to auscultation  bilaterally,   Chest wall:    No deformity  Heart:    S1 and S2 normal, no murmur, CVP - no.  Pressors - no  Abdomen:     Soft, no masses, no organomegaly  Genitalia:    Not done  Rectal:   not done  Extremities:   Extremities- intact     Skin:   Intact in exposed areas .      Neurologic:   Sedation - none -> RASS - na . Moves all 4s - yes. CAM-ICU - neg . Orientation - x3+         Assessment:  ICD-10-CM   1. Chronic cough R05 Nitric oxide  2. 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 Brand Males, M.D., [facility].C.P Pulmonary and Critical Care Medicine Staff Physician, [place] Director - Interstitial Lung Disease  Program  Pulmonary [place] at [place], [place], [address]  Pager: [phone], If no answer or between  15:00h - 7:00h: call [phone] Telephone: [phone]

## 2017-12-28 NOTE — Addendum Note (Signed)
Addended by: Lorretta Harp on: 12/28/2017 03:15 PM   Modules accepted: Orders

## 2017-12-28 NOTE — Patient Instructions (Addendum)
ICD-10-CM   1. Chronic cough R05 Nitric oxide  2. 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

## 2018-01-07 ENCOUNTER — Other Ambulatory Visit: Payer: Self-pay

## 2018-01-21 ENCOUNTER — Inpatient Hospital Stay: Payer: Medicare Other | Attending: Oncology

## 2018-01-21 DIAGNOSIS — D8989 Other specified disorders involving the immune mechanism, not elsewhere classified: Secondary | ICD-10-CM

## 2018-01-21 DIAGNOSIS — M3130 Wegener's granulomatosis without renal involvement: Secondary | ICD-10-CM | POA: Insufficient documentation

## 2018-01-21 LAB — CBC WITH DIFFERENTIAL/PLATELET
BASOS PCT: 2 %
Basophils Absolute: 0.1 10*3/uL (ref 0.0–0.1)
EOS ABS: 0.1 10*3/uL (ref 0.0–0.5)
Eosinophils Relative: 3 %
HEMATOCRIT: 39.3 % (ref 34.8–46.6)
HEMOGLOBIN: 12.9 g/dL (ref 11.6–15.9)
Lymphocytes Relative: 35 %
Lymphs Abs: 1.8 10*3/uL (ref 0.9–3.3)
MCH: 29.5 pg (ref 25.1–34.0)
MCHC: 32.9 g/dL (ref 31.5–36.0)
MCV: 89.7 fL (ref 79.5–101.0)
Monocytes Absolute: 0.5 10*3/uL (ref 0.1–0.9)
Monocytes Relative: 11 %
NEUTROS PCT: 49 %
Neutro Abs: 2.4 10*3/uL (ref 1.5–6.5)
Platelets: 289 10*3/uL (ref 145–400)
RBC: 4.38 MIL/uL (ref 3.70–5.45)
RDW: 13.5 % (ref 11.2–14.5)
WBC: 5 10*3/uL (ref 3.9–10.3)

## 2018-01-21 LAB — COMPREHENSIVE METABOLIC PANEL
ALT: 20 U/L (ref 0–55)
AST: 17 U/L (ref 5–34)
Albumin: 3.6 g/dL (ref 3.5–5.0)
Alkaline Phosphatase: 43 U/L (ref 40–150)
Anion gap: 5 (ref 3–11)
BUN: 14 mg/dL (ref 7–26)
CALCIUM: 8.8 mg/dL (ref 8.4–10.4)
CHLORIDE: 108 mmol/L (ref 98–109)
CO2: 25 mmol/L (ref 22–29)
CREATININE: 0.82 mg/dL (ref 0.60–1.10)
Glucose, Bld: 75 mg/dL (ref 70–140)
Potassium: 4.1 mmol/L (ref 3.5–5.1)
Sodium: 138 mmol/L (ref 136–145)
Total Bilirubin: 0.3 mg/dL (ref 0.2–1.2)
Total Protein: 6.4 g/dL (ref 6.4–8.3)

## 2018-02-13 ENCOUNTER — Other Ambulatory Visit: Payer: Self-pay | Admitting: Internal Medicine

## 2018-02-13 DIAGNOSIS — E042 Nontoxic multinodular goiter: Secondary | ICD-10-CM

## 2018-02-19 ENCOUNTER — Ambulatory Visit
Admission: RE | Admit: 2018-02-19 | Discharge: 2018-02-19 | Disposition: A | Discharge status: Home / Self Care | Payer: Medicare Other | Source: Ambulatory Visit | Attending: Internal Medicine | Admitting: Internal Medicine

## 2018-02-19 DIAGNOSIS — E042 Nontoxic multinodular goiter: Secondary | ICD-10-CM

## 2018-03-21 ENCOUNTER — Encounter: Payer: Self-pay | Admitting: Internal Medicine

## 2018-03-21 ENCOUNTER — Encounter: Payer: Self-pay | Admitting: Oncology

## 2018-03-21 ENCOUNTER — Other Ambulatory Visit: Payer: Self-pay | Admitting: *Deleted

## 2018-03-21 DIAGNOSIS — M3130 Wegener's granulomatosis without renal involvement: Secondary | ICD-10-CM

## 2018-03-21 DIAGNOSIS — K219 Gastro-esophageal reflux disease without esophagitis: Secondary | ICD-10-CM

## 2018-04-03 ENCOUNTER — Encounter

## 2018-04-03 ENCOUNTER — Ambulatory Visit: Payer: Self-pay | Admitting: Nurse Practitioner

## 2018-04-08 ENCOUNTER — Telehealth: Payer: Self-pay

## 2018-04-08 ENCOUNTER — Inpatient Hospital Stay: Payer: Medicare Other

## 2018-04-08 ENCOUNTER — Ambulatory Visit: Payer: Self-pay | Admitting: Oncology

## 2018-04-08 NOTE — Telephone Encounter (Signed)
Today's appt cancelled per pt request.   Msg sent to schedulers to reschedule for 8/21, 22, or 23 per pt request.

## 2018-04-11 ENCOUNTER — Telehealth: Payer: Self-pay | Admitting: Oncology

## 2018-04-11 NOTE — Telephone Encounter (Signed)
Scheduled appt per 8/5 sch message - left message for patient with appt date and time.

## 2018-04-22 ENCOUNTER — Telehealth: Payer: Self-pay | Admitting: Oncology

## 2018-04-22 NOTE — Telephone Encounter (Signed)
Spoke to patient regarding upcoming appt updates per 8/19 sch message

## 2018-04-24 ENCOUNTER — Other Ambulatory Visit: Payer: Self-pay

## 2018-04-24 ENCOUNTER — Ambulatory Visit: Payer: Self-pay

## 2018-04-26 ENCOUNTER — Inpatient Hospital Stay: Payer: Medicare Other

## 2018-04-26 ENCOUNTER — Inpatient Hospital Stay: Payer: Medicare Other | Attending: Oncology

## 2018-04-26 DIAGNOSIS — M3130 Wegener's granulomatosis without renal involvement: Secondary | ICD-10-CM | POA: Insufficient documentation

## 2018-04-26 DIAGNOSIS — M810 Age-related osteoporosis without current pathological fracture: Secondary | ICD-10-CM

## 2018-04-26 DIAGNOSIS — D8989 Other specified disorders involving the immune mechanism, not elsewhere classified: Secondary | ICD-10-CM

## 2018-04-26 DIAGNOSIS — Z79899 Other long term (current) drug therapy: Secondary | ICD-10-CM | POA: Insufficient documentation

## 2018-04-26 LAB — CBC WITH DIFFERENTIAL/PLATELET
Basophils Absolute: 0.1 10*3/uL (ref 0.0–0.1)
Basophils Relative: 1 %
Eosinophils Absolute: 0.2 10*3/uL (ref 0.0–0.5)
Eosinophils Relative: 3 %
HEMATOCRIT: 40.3 % (ref 34.8–46.6)
HEMOGLOBIN: 13.3 g/dL (ref 11.6–15.9)
LYMPHS ABS: 2.2 10*3/uL (ref 0.9–3.3)
LYMPHS PCT: 31 %
MCH: 30.3 pg (ref 25.1–34.0)
MCHC: 33 g/dL (ref 31.5–36.0)
MCV: 91.8 fL (ref 79.5–101.0)
Monocytes Absolute: 0.7 10*3/uL (ref 0.1–0.9)
Monocytes Relative: 10 %
NEUTROS ABS: 3.8 10*3/uL (ref 1.5–6.5)
NEUTROS PCT: 55 %
Platelets: 282 10*3/uL (ref 145–400)
RBC: 4.39 MIL/uL (ref 3.70–5.45)
RDW: 13.2 % (ref 11.2–14.5)
WBC: 6.9 10*3/uL (ref 3.9–10.3)

## 2018-04-26 LAB — COMPREHENSIVE METABOLIC PANEL
ALT: 16 U/L (ref 0–44)
ANION GAP: 6 (ref 5–15)
AST: 14 U/L — AB (ref 15–41)
Albumin: 3.7 g/dL (ref 3.5–5.0)
Alkaline Phosphatase: 47 U/L (ref 38–126)
BUN: 14 mg/dL (ref 6–20)
CHLORIDE: 108 mmol/L (ref 98–111)
CO2: 26 mmol/L (ref 22–32)
Calcium: 9.6 mg/dL (ref 8.9–10.3)
Creatinine, Ser: 0.78 mg/dL (ref 0.44–1.00)
GFR calc non Af Amer: >60 mL/min (ref >60)
Glucose, Bld: 91 mg/dL (ref 70–99)
POTASSIUM: 4.6 mmol/L (ref 3.5–5.1)
Sodium: 140 mmol/L (ref 135–145)
Total Bilirubin: 0.3 mg/dL (ref 0.3–1.2)
Total Protein: 6.7 g/dL (ref 6.5–8.1)

## 2018-04-26 MED ORDER — DENOSUMAB 60 MG/ML ~~LOC~~ SOSY
60.0000 mg | PREFILLED_SYRINGE | Freq: Once | SUBCUTANEOUS | Status: AC
  Administered 2018-04-26: 60 mg via SUBCUTANEOUS

## 2018-04-26 NOTE — Patient Instructions (Signed)
Denosumab injection  What is this medicine?  DENOSUMAB (den oh sue mab) slows bone breakdown. Prolia is used to treat osteoporosis in women after menopause and in men. Xgeva is used to prevent bone fractures and other bone problems caused by cancer bone metastases. Xgeva is also used to treat giant cell tumor of the bone.  This medicine may be used for other purposes; ask your health care provider or pharmacist if you have questions.  What should I tell my health care provider before I take this medicine?  They need to know if you have any of these conditions:  -dental disease  -eczema  -infection or history of infections  -kidney disease or on dialysis  -low blood calcium or vitamin D  -malabsorption syndrome  -scheduled to have surgery or tooth extraction  -taking medicine that contains denosumab  -thyroid or parathyroid disease  -an unusual reaction to denosumab, other medicines, foods, dyes, or preservatives  -pregnant or trying to get pregnant  -breast-feeding  How should I use this medicine?  This medicine is for injection under the skin. It is given by a health care professional in a hospital or clinic setting.  If you are getting Prolia, a special MedGuide will be given to you by the pharmacist with each prescription and refill. Be sure to read this information carefully each time.  For Prolia, talk to your pediatrician regarding the use of this medicine in children. Special care may be needed. For Xgeva, talk to your pediatrician regarding the use of this medicine in children. While this drug may be prescribed for children as young as 13 years for selected conditions, precautions do apply.  Overdosage: If you think you have taken too much of this medicine contact a poison control center or emergency room at once.  NOTE: This medicine is only for you. Do not share this medicine with others.  What if I miss a dose?  It is important not to miss your dose. Call your doctor or health care professional if you are  unable to keep an appointment.  What may interact with this medicine?  Do not take this medicine with any of the following medications:  -other medicines containing denosumab  This medicine may also interact with the following medications:  -medicines that suppress the immune system  -medicines that treat cancer  -steroid medicines like prednisone or cortisone  This list may not describe all possible interactions. Give your health care provider a list of all the medicines, herbs, non-prescription drugs, or dietary supplements you use. Also tell them if you smoke, drink alcohol, or use illegal drugs. Some items may interact with your medicine.  What should I watch for while using this medicine?  Visit your doctor or health care professional for regular checks on your progress. Your doctor or health care professional may order blood tests and other tests to see how you are doing.  Call your doctor or health care professional if you get a cold or other infection while receiving this medicine. Do not treat yourself. This medicine may decrease your body's ability to fight infection.  You should make sure you get enough calcium and vitamin D while you are taking this medicine, unless your doctor tells you not to. Discuss the foods you eat and the vitamins you take with your health care professional.  See your dentist regularly. Brush and floss your teeth as directed. Before you have any dental work done, tell your dentist you are receiving this medicine.  Do   not become pregnant while taking this medicine or for 5 months after stopping it. Women should inform their doctor if they wish to become pregnant or think they might be pregnant. There is a potential for serious side effects to an unborn child. Talk to your health care professional or pharmacist for more information.  What side effects may I notice from receiving this medicine?  Side effects that you should report to your doctor or health care professional as soon as  possible:  -allergic reactions like skin rash, itching or hives, swelling of the face, lips, or tongue  -breathing problems  -chest pain  -fast, irregular heartbeat  -feeling faint or lightheaded, falls  -fever, chills, or any other sign of infection  -muscle spasms, tightening, or twitches  -numbness or tingling  -skin blisters or bumps, or is dry, peels, or red  -slow healing or unexplained pain in the mouth or jaw  -unusual bleeding or bruising  Side effects that usually do not require medical attention (Report these to your doctor or health care professional if they continue or are bothersome.):  -muscle pain  -stomach upset, gas  This list may not describe all possible side effects. Call your doctor for medical advice about side effects. You may report side effects to FDA at 1-800-FDA-1088.  Where should I keep my medicine?  This medicine is only given in a clinic, doctor's office, or other health care setting and will not be stored at home.  NOTE: This sheet is a summary. It may not cover all possible information. If you have questions about this medicine, talk to your doctor, pharmacist, or health care provider.      2016, Elsevier/Gold Standard. (2012-02-19 12:37:47)

## 2018-05-24 ENCOUNTER — Encounter: Payer: Self-pay | Admitting: *Deleted

## 2018-05-29 ENCOUNTER — Ambulatory Visit: Payer: Medicare Other | Admitting: Internal Medicine

## 2018-05-29 ENCOUNTER — Ambulatory Visit: Payer: Self-pay | Admitting: Internal Medicine

## 2018-05-29 ENCOUNTER — Encounter

## 2018-05-29 ENCOUNTER — Encounter: Payer: Self-pay | Admitting: Internal Medicine

## 2018-05-29 VITALS — BP 102/68 | HR 77 | Ht 66.0 in | Wt 161.0 lb

## 2018-05-29 DIAGNOSIS — K219 Gastro-esophageal reflux disease without esophagitis: Secondary | ICD-10-CM

## 2018-05-29 MED ORDER — OMEPRAZOLE 40 MG PO CPDR: 40.0000 mg | DELAYED_RELEASE_CAPSULE | Freq: Every day | ORAL | 1 refills | Status: DC

## 2018-05-29 NOTE — Patient Instructions (Signed)
We have sent the following medications to your pharmacy for you to pick up at your convenience: Omeprazole 40 mg daily  You will be due for a recall colonoscopy in 12/2022. We will send you a reminder in the mail when it gets closer to that time.  Please follow up with Dr Hilarie Fredrickson in 3-6 months.  If you are age 56 or older, your body mass index should be between 23-30. Your Body mass index is 25.99 kg/m. If this is out of the aforementioned range listed, please consider follow up with your Primary Care Provider.  If you are age 3 or younger, your body mass index should be between 19-25. Your Body mass index is 25.99 kg/m. If this is out of the aformentioned range listed, please consider follow up with your Primary Care Provider.

## 2018-05-29 NOTE — Progress Notes (Signed)
Patient ID: Theresa [name], female   DOB: May 03, 1961, 57 y.o.   MRN: [id number] HPI: Theresa [name] is a 57 year old female with a past medical history of GERD, gastritis not associated with H. pylori, Wegener's granulomatosis managed by Dr. [name] and by physicians at [facility], history of osteoporosis related to chronic steroid use, history of headaches who is seen in consultation at the request of Dr. Moreen Fowler and Dr. [name] to evaluate reflux disease.  She is here alone today.  She has a history of prior GERD management with [facility].  I have reviewed these records and she had an upper endoscopy and colonoscopy in June 2014.  At EGD exam was unremarkable with the exception of a small hiatal hernia and mild antral gastritis.  Gastritis biopsies showed chronic inflammation but no H. pylori.  Colonoscopy was normal with the exception of diverticulosis.  She reports that she has a history of somewhat severe acid reflux disease.  She recalls a remote 24-hour pH study which confirmed her acid reflux.  She reports that she can have episodes or bouts of severe heartburn, sour brash and pyrosis.  This is worse with bouts of stress.  At times she describes her symptoms as "off the charts".  Earlier this year she had been using Nexium but when symptoms worsen she called and received a prescription for Nexium 40 mg twice daily.  This did not seem to help in fact seem to worsen her symptoms.  She was using Gaviscon and other over-the-counter antacids.  This was eventually switched to omeprazole which for her seem to work better than Nexium and her symptoms came under control.  She is currently using omeprazole 20 mg daily.  She rates her current heartburn symptoms on a scale of 10 with 10 being severe at currently a 4 out of 10.  She is not having dysphagia or odynophagia.  When her symptoms are severe she feels pain radiate from her xiphoid area through to her back.  Occasionally she will  have some sore throat type pain.  When her symptoms are most severe she often also experience his loose stools.  Currently stools are regular without diarrhea or constipation.  She had an episode of blood in her stool about 5 or 6 months ago while she was at [facility].  She is not seeing any further rectal bleeding, blood in her stool and no history of melena.  There is no family history of colorectal cancer though her sister has Crohn's disease and has had colon polyps.  Past Medical History:  Diagnosis Date  . Anxiety   . Arthritis   . Gastritis   . GERD (gastroesophageal reflux disease)   . Headaches, cluster   . Hx MRSA infection   . Mitral valve prolapse   . Osteoporosis   . Tinnitus   . Varicose veins   . Wegener's granulomatosis ([place]) 1998   autoimmune vasculitits    Past Surgical History:  Procedure Laterality Date  . AIRWAY FOREIGN BODY REMOVAL    . ENDOVENOUS ABLATION SAPHENOUS VEIN W/ LASER Left 06-03-2015   endovenous laser ablation left greater saphenous vein and stab phlebectomy 10-20 incisions left leg by Curt Jews MD  . ENDOVENOUS ABLATION SAPHENOUS VEIN W/ LASER Right 06-24-2015   endovenous laser ablation right greater saphenous vein and stab phlebectomy 10-20 incisions   . EYE SURGERY    . mitral valve prolaspe    . NASAL SINUS SURGERY    . tear  duct surgery    . TRACHEOSTOMY CLOSURE      Outpatient Medications Prior to Visit  Medication Sig Dispense Refill  . albuterol (PROVENTIL HFA;VENTOLIN HFA) 108 (90 BASE) MCG/ACT inhaler Inhale 2 puffs into the lungs every 6 (six) hours as needed. For shortness of breath or wheezing    . denosumab (PROLIA) 60 MG/ML SOSY injection Inject 60 mg into the skin every 6 (six) months.    . fluticasone (FLONASE) 50 MCG/ACT nasal spray Place 2 sprays into the nose daily.    . fluticasone (FLOVENT HFA) 110 MCG/ACT inhaler Inhale 2 puffs into the lungs daily.    . Hypertonic Nasal Wash (SINUS RINSE BOTTLE KIT NA) Place into  the nose.    Marland Kitchen LORazepam (ATIVAN) 1 MG tablet Take 1 tablet (1 mg total) by mouth 2 (two) times daily as needed for anxiety. 60 tablet 1  . Multiple Vitamins-Minerals (PRESERVISION AREDS 2 PO) Take by mouth.    Marland Kitchen omeprazole (PRILOSEC) 20 MG capsule Take 20 mg by mouth daily.    Marland Kitchen sulfamethoxazole-trimethoprim (BACTRIM,SEPTRA) 400-80 MG tablet Take 1 tablet by mouth 3 (three) times a week.     . zolpidem (AMBIEN) 10 MG tablet Take one tablet hs prn.  Do not take with Ativan 30 tablet 1  . Mometasone Furoate (ASMANEX HFA) 100 MCG/ACT AERO Inhale 2 puffs into the lungs 2 (two) times daily. (Patient not taking: Reported on 05/29/2018) 1 Inhaler 0  . esomeprazole (NEXIUM) 40 MG capsule Take 40 mg by mouth daily as needed.      No facility-administered medications prior to visit.     Allergies  Allergen Reactions  . Codeine Itching  . Eletriptan Nausea Only  . Plaquenil [Hydroxychloroquine Sulfate] Other (See Comments)    itching  . Vancomycin Hives  . Tape Rash    Family History  Problem Relation Age of Onset  . Asthma Daughter   . Irritable bowel syndrome Daughter   . Asthma Sister   . Cancer Sister   . Irritable bowel syndrome Sister   . CAD Father   . Skin cancer Father   . Heart disease Father   . Osteoporosis Father   . COPD Father   . Prostate cancer Father   . Breast cancer Sister   . Crohn's disease Sister        or colitis  . Irritable bowel syndrome Sister   . Hypertension Mother   . Osteoporosis Mother   . Colon polyps Sister   . Irritable bowel syndrome Sister     Social History   Tobacco Use  . Smoking status: Never Smoker  . Smokeless tobacco: Never Used  Substance Use Topics  . Alcohol use: Yes    Alcohol/week: 0.0 standard drinks    Comment: occasionally  . Drug use: No    ROS: As per history of present illness, otherwise negative  BP 102/68   Pulse 77   Ht _0  (1.676 m)   Wt 161 lb (73 kg)   BMI 25.99 kg/m  Constitutional: Well-developed  and well-nourished. No distress. HEENT: Normocephalic and atraumatic.  Conjunctivae are normal.  No scleral icterus. Neck: Neck supple. Trachea midline. Cardiovascular: Normal rate, regular rhythm and intact distal pulses.  Pulmonary/chest: Effort normal and breath sounds normal. No wheezing, rales or rhonchi. Abdominal: Soft, nontender, nondistended. Bowel sounds active throughout. There are no masses palpable. No hepatosplenomegaly. Extremities: no clubbing, cyanosis, or edema Neurological: Alert and oriented to person place and time. Skin: Skin is warm  and dry.  Psychiatric: Normal mood and affect. Behavior is normal.  RELEVANT LABS AND IMAGING: CBC    Component Value Date/Time   WBC 6.9 04/26/2018 1046   RBC 4.39 04/26/2018 1046   HGB 13.3 04/26/2018 1046   HGB 13.6 10/22/2017 1001   HGB 13.5 08/06/2017 1335   HCT 40.3 04/26/2018 1046   HCT 41.7 08/06/2017 1335   PLT 282 04/26/2018 1046   PLT 309 10/22/2017 1001   PLT 317 08/06/2017 1335   MCV 91.8 04/26/2018 1046   MCV 92.1 08/06/2017 1335   MCH 30.3 04/26/2018 1046   MCHC 33.0 04/26/2018 1046   RDW 13.2 04/26/2018 1046   RDW 13.2 08/06/2017 1335   LYMPHSABS 2.2 04/26/2018 1046   LYMPHSABS 2.3 08/06/2017 1335   MONOABS 0.7 04/26/2018 1046   MONOABS 0.9 08/06/2017 1335   EOSABS 0.2 04/26/2018 1046   EOSABS 0.2 08/06/2017 1335   BASOSABS 0.1 04/26/2018 1046   BASOSABS 0.1 08/06/2017 1335    CMP     Component Value Date/Time   NA 140 04/26/2018 1046   NA 140 08/06/2017 1335   K 4.6 04/26/2018 1046   K 4.7 08/06/2017 1335   CL 108 04/26/2018 1046   CL 106 11/07/2012 0916   CO2 26 04/26/2018 1046   CO2 26 08/06/2017 1335   GLUCOSE 91 04/26/2018 1046   GLUCOSE 62 (L) 08/06/2017 1335   GLUCOSE 64 (L) 11/07/2012 0916   BUN 14 04/26/2018 1046   BUN 17.3 08/06/2017 1335   CREATININE 0.78 04/26/2018 1046   CREATININE 0.87 10/22/2017 1001   CREATININE 0.9 08/06/2017 1335   CALCIUM 9.6 04/26/2018 1046   CALCIUM  9.9 08/06/2017 1335   PROT 6.7 04/26/2018 1046   PROT 7.0 08/06/2017 1335   ALBUMIN 3.7 04/26/2018 1046   ALBUMIN 3.9 08/06/2017 1335   AST 14 (L) 04/26/2018 1046   AST 18 10/22/2017 1001   AST 18 08/06/2017 1335   ALT 16 04/26/2018 1046   ALT 16 10/22/2017 1001   ALT 20 08/06/2017 1335   ALKPHOS 47 04/26/2018 1046   ALKPHOS 50 08/06/2017 1335   BILITOT 0.3 04/26/2018 1046   BILITOT 0.6 10/22/2017 1001   BILITOT 0.40 08/06/2017 1335   GFRNONAA >60 04/26/2018 1046   GFRNONAA >60 10/22/2017 1001   GFRAA >60 04/26/2018 1046   GFRAA >60 10/22/2017 1001   PROCEDURE: 1.EGD w/ biopsy  INDICATIONS: Heartburn   MEDICATIONS: 1.See Anesthesia Report.  DESCRIPTION OF PROCEDURE: After the risks, benefits, and alternatives to the procedure were explained, informed consent was obtained.The [id number] endoscope was introduced through the mouth and advanced to the descending duodenum.The instrument was slowly withdrawn as the mucosa was fully examined.     The entire examined esophagus appeared normal. The entire examined duodenal mucosa appeared normal. Abnormal mucosa was found in the gastric antrum. Erythema was found. Multiple biopsies were performed.Cold forceps were used.Sample obtained for histology.Retroflexion views revealed a hiatal hernia.The endoscope was then slowly withdrawn and removed.  COMPLICATIONS: There were no complications.  IMPRESSIONS: 1.Normal esophagus 2.Normal duodenum 3.Abnormal mucosa in the gastric antrum; Therapies performed: Multiple biopsies were performed. 4.Retroflexion views revealed a hiatal hernia   RECOMMENDATIONS: Await biopsy results  RECALL DATE: PATHOLOGY: Pathology - Sample Taken  CPT EQAST:41962 UPPER GASTROINTESTINAL ENDOSCOPY INCLUDING ESOPHAGUS, STOMACH, AND EITHER THE DUODENUM AND/OR JEJUNUM AS APPROPRIATE; WITH BIOPSY, SINGLE OR MULTIPLE  ICD9  CODE:1.787.1 Heartburn 2.537.89 Other specified disorders of stomach and duodenum   The ICD and CPT codes recommended by  this software are interpretations from the data that the clinical staff has captured with the software.The verification of the translation of this report to the ICD and CPT codes and modifiers is the sole responsibility of the health care institution and practicing physician where this report was generated.[employer]. will not be held responsible for the validity of the ICD and CPT codes included on this report. AMA assumes no liability for data contained or not contained herein. CPT is a Designer, television/film set of the [employer].   ___________________________________ Aviva Signs M.D. eSigned:Girish Mishra M.D. 02/27/2013 1:43 PM  STOMACH, BIOPSY: Mild chronic gastritis. No Helicobacter pylori organisms identified.  PROCEDURE: 1.screening colonoscopy  INDICATIONS: 1.Screening, average risk patient for colorectal cancer  ESTIMATED BLOOD LOSS: None. MEDICATIONS: 1.See Anesthesia Report.  DESCRIPTION OF PROCEDURE: After the risks benefits and alternatives of the procedure were thoroughly explained, Informed consent was obtained.Digital rectal exam was performed and revealed no abnormality. The [id number] endoscope was introduced through the anus and advanced to the cecum, which was identified by both the appendix and ileocecal valve.The prep was excellent.The instrument was then slowly withdrawn as the colon was fully examined.     The colonic mucosa appeared normal.No polyps, masses, inflammatory changes or vascular ectasias were evident.Diverticulosis was present.Retroflexed view of the rectum was normal.The scope was then completely withdrawn from the patient and the procedure terminated.  LIMITATIONS: No limitations. COMPLICATIONS:  None. ENDOSCOPIC IMPRESSIONS: Normal Colonoscopy.  RECOMMENDATIONS: Follow-up with primary care provider. PATHOLOGY: Pathology - No Specimens Obtained REPEAT EXAM: Return in 10 year(s) for Colonoscopy.    CPT CODES: 08657 COLONOSCOPY, FLEXIBLE, PROXIMAL TO SPLENIC FLEXURE; DIAGNOSTIC, WITH OR WITHOUT COLLECTION OF SPECIMEN(S) BY BRUSHING OR WASHING, WITH OR WITHOUT COLON DECOMPRESSION (SEPARATE PROCEDURE)  ICD9 CODES: V76.51 Special screening for malignant neoplasms, colon   The ICD and CPT codes recommended by this software are interpretations from the data that the clinical staff has captured with the software.The verification of the translation of this report to the ICD and CPT codes and modifiers is the sole responsibility of the health care institution and practicing physician where this report was generated.[employer]. will not be held responsible for the validity of the ICD and CPT codes included on this report. AMA assumes no liability for data contained or not contained herein. CPT is a Designer, television/film set of the [employer].  __________________________________ Aviva Signs M.D. eSigned:Girish Mishra M.D. 02/27/2013 1:48 PM   CT ABDOMEN AND PELVIS WITH CONTRAST   TECHNIQUE: Multidetector CT imaging of the abdomen and pelvis was performed using the standard protocol following bolus administration of intravenous contrast.   CONTRAST:  116m ISOVUE-300 IOPAMIDOL (ISOVUE-300) INJECTION 61%   COMPARISON:  None.   FINDINGS: Lower chest: Unremarkable   Hepatobiliary: Very subtle 4 by 5 mm hypodensity in segment 4 of the liver, image 22/2, technically nonspecific although statistically very likely to be benign.   1.7 cm density in the gallbladder favoring gallstone, image 33/2.   4 mm peripheral hypodensity in the right hepatic lobe, image 36/2, again statistically very likely to be benign but  technically nonspecific due to small size.   Pancreas: Unremarkable   Spleen: Unremarkable   Adrenals/Urinary Tract: 5 mm calyceal diverticulum in the right mid kidney, image 16/6. 5 mm low-density lesion in the right kidney upper pole on image 10/6, technically nonspecific although statistically likely to be benign. 10 mm cyst in the left mid kidney medially on image 54/3.   Stomach/Bowel: There  scattered diverticula of the descending colon and at the splenic flexure. No findings of active diverticulitis. Appendix normal.   Vascular/Lymphatic: Unremarkable   Reproductive: Endometrial thickness 7 mm. Ring-like structure suggesting NuvaRing or cervical cap in the vicinity of the vaginal fornices along the distal margin of the cervix. Correlate with gynecologic history.   Other: No supplemental non-categorized findings.   Musculoskeletal: Moderate degenerative chondral thinning in both hips. Mild lumbar spondylosis and degenerative disc disease.   IMPRESSION: 1. Scattered diverticula in the descending colon and at the splenic fact flexure, without evidence of active diverticulitis at this time. 2. Ring-like hypodense structure of tracking around the vaginal fornices adjacent to the cervix, possibly a NuvaRing or cervical cap, correlate with gynecologic history and need for this device. 3. Several tiny hypodense hepatic and renal lesions are likely small cysts or similar benign lesions, but technically too small to characterize. 4. Cholelithiasis. 5. Incidental 5 mm calyceal diverticulum of the right mid kidney.     Electronically Signed   By: Van Clines M.D.   On: 08/18/2016 15:53   ASSESSMENT/PLAN: 57 year old female with a past medical history of GERD, gastritis not associated with H. pylori, Wegener's granulomatosis managed by Dr. [name] and by physicians at [facility], history of osteoporosis related to chronic steroid use, history of headaches  who is seen in consultation at the request of Dr. Moreen Fowler and Dr. [name] to evaluate reflux disease.  1. GERD --acid reflux disease symptoms.  We spent time today discussing management and her symptoms are not completely controlled at present.  She is on very low-dose omeprazole.  Omeprazole seems to be working better for her than did esomeprazole.  I would like to increase her omeprazole from 20 to 40 mg daily.  This is best 30 minutes before first meal the day.  I have asked that she notify me if symptoms fail to come under complete control.  There is no history of Barrett's esophagus and at this point I do not think we need to repeat her upper endoscopy.  GERD diet and reflux precautions recommended.  She is already elevating the head of her bed.  2.  CRC screening --up-to-date with colonoscopy, would repeat would be June 2024  Follow-up in 3 to 6 months to ensure control of symptoms, after that annually if she remains on reflux medication      [id number], [address], [place], [place] [address]

## 2018-06-18 ENCOUNTER — Telehealth: Payer: Self-pay | Admitting: Oncology

## 2018-06-18 NOTE — Telephone Encounter (Signed)
Scheduled lab appt per Patient request - okay per Dr. [name] - lab is needed .

## 2018-06-19 ENCOUNTER — Inpatient Hospital Stay: Payer: Medicare Other | Attending: Oncology

## 2018-06-19 DIAGNOSIS — D8989 Other specified disorders involving the immune mechanism, not elsewhere classified: Secondary | ICD-10-CM

## 2018-06-19 DIAGNOSIS — Z79899 Other long term (current) drug therapy: Secondary | ICD-10-CM | POA: Insufficient documentation

## 2018-06-19 DIAGNOSIS — Z9221 Personal history of antineoplastic chemotherapy: Secondary | ICD-10-CM | POA: Diagnosis not present

## 2018-06-19 DIAGNOSIS — M3130 Wegener's granulomatosis without renal involvement: Secondary | ICD-10-CM | POA: Diagnosis not present

## 2018-06-19 DIAGNOSIS — M810 Age-related osteoporosis without current pathological fracture: Secondary | ICD-10-CM | POA: Diagnosis not present

## 2018-06-19 LAB — CBC WITH DIFFERENTIAL/PLATELET
ABS IMMATURE GRANULOCYTES: 0.02 10*3/uL (ref 0.00–0.07)
BASOS PCT: 1 %
Basophils Absolute: 0.1 10*3/uL (ref 0.0–0.1)
Eosinophils Absolute: 0.1 10*3/uL (ref 0.0–0.5)
Eosinophils Relative: 2 %
HCT: 41.2 % (ref 36.0–46.0)
Hemoglobin: 13.3 g/dL (ref 12.0–15.0)
IMMATURE GRANULOCYTES: 0 %
Lymphocytes Relative: 32 %
Lymphs Abs: 2.2 10*3/uL (ref 0.7–4.0)
MCH: 29.6 pg (ref 26.0–34.0)
MCHC: 32.3 g/dL (ref 30.0–36.0)
MCV: 91.8 fL (ref 80.0–100.0)
MONOS PCT: 10 %
Monocytes Absolute: 0.7 10*3/uL (ref 0.1–1.0)
NEUTROS ABS: 3.7 10*3/uL (ref 1.7–7.7)
NEUTROS PCT: 55 %
Platelets: 314 10*3/uL (ref 150–400)
RBC: 4.49 MIL/uL (ref 3.87–5.11)
RDW: 12.8 % (ref 11.5–15.5)
WBC: 6.8 10*3/uL (ref 4.0–10.5)
nRBC: 0 % (ref 0.0–0.2)

## 2018-06-19 LAB — COMPREHENSIVE METABOLIC PANEL
ALBUMIN: 3.6 g/dL (ref 3.5–5.0)
ALT: 18 U/L (ref 0–44)
ANION GAP: 8 (ref 5–15)
AST: 16 U/L (ref 15–41)
Alkaline Phosphatase: 43 U/L (ref 38–126)
BUN: 16 mg/dL (ref 6–20)
CHLORIDE: 108 mmol/L (ref 98–111)
CO2: 24 mmol/L (ref 22–32)
Calcium: 9.1 mg/dL (ref 8.9–10.3)
Creatinine, Ser: 0.79 mg/dL (ref 0.44–1.00)
GFR calc Af Amer: >60 mL/min (ref >60)
GFR calc non Af Amer: >60 mL/min (ref >60)
GLUCOSE: 63 mg/dL — AB (ref 70–99)
POTASSIUM: 4.1 mmol/L (ref 3.5–5.1)
SODIUM: 140 mmol/L (ref 135–145)
Total Bilirubin: 0.4 mg/dL (ref 0.3–1.2)
Total Protein: 6.9 g/dL (ref 6.5–8.1)

## 2018-07-02 ENCOUNTER — Encounter: Payer: Self-pay | Admitting: Internal Medicine

## 2018-07-02 ENCOUNTER — Ambulatory Visit: Payer: Medicare Other | Admitting: Internal Medicine

## 2018-07-02 VITALS — BP 118/78 | HR 73 | Ht 66.0 in | Wt 160.2 lb

## 2018-07-02 DIAGNOSIS — M3130 Wegener's granulomatosis without renal involvement: Secondary | ICD-10-CM

## 2018-07-02 DIAGNOSIS — R05 Cough: Secondary | ICD-10-CM

## 2018-07-02 DIAGNOSIS — R053 Chronic cough: Secondary | ICD-10-CM

## 2018-07-02 MED ORDER — MOMETASONE FUROATE 100 MCG/ACT IN AERO: 2.0000 | INHALATION_SPRAY | Freq: Two times a day (BID) | RESPIRATORY_TRACT | 5 refills | Status: DC

## 2018-07-02 NOTE — Patient Instructions (Addendum)
ICD-10-CM   1. Chronic cough R05   2. 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

## 2018-07-02 NOTE — Progress Notes (Signed)
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      57 year old female. 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  CAre direction is from Dr [name] at [facility] in [place], [place]. Main reason she is here she wants local pulmonologist back up  Reports that in  1998 when pregnant with daughter she developed severe headaches and sinusitis. She recollects mulitple inteventions and Rx prior sinus surgery x 2, prior right tear duct surgery at [facility] for epiphora (with subsequent re-obstruction). She also has a history of nasal saddling and inflammation left greater than right.  And biopsies with no diagnosis. In 1999 had upper airway obstruction and underwent emergency trach at [facility] (Dr [name]). 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   She has been treated in the past with Cytoxan and prednisone, methotrexate very briefly, Cytoxan orally, then Imuran for some time, and then off treatment for some years. She was started on prednisone alone in 2009, and then in 06/2008 the azathioprine was added again at 150 mg daily, and variable doses of prednisone. Then in end 2013, early 2013 had relapse and needed rituxan and was felt  She could not go below prednisone '20mg'$  per day. She had first dose at [facility] and subsequent 3 weekly  doses with [name] early 2013.  Aside from the Wegener, she has a history of osteopenia, history of migraines,  history of GERD, history of mitral valve prolapse, and a history of MRSA infection remotely (1999).   In 2014: but it appers disease in stable/remission with prednisone and in 2014: she was able to get disability. In May 2015 She followed with [name], disease was felt to be stable. HEr main issues were fatigue, insomnia , anxiety, and mild anxiety. SHe has clarified that overall she has handled lif stressors well and does not suffer from anxiety disorder per se. IF atll anxiety is mild and only situational  She is now referred because she wants a local Rx team. She feels her [facility] eNT team is retiring. And apparently Dr [name] at [facility] is moving towards research + expensive to fly up there. So far Rx decisions have come from [facility] and not from [place] [facility]. She is making a trip up there in August 2015 and will see if she can continue going there but she will likely look for increasing Rx decisions from [place].  From pulm perspective, she prefers [facility] for admissions and prefers pulmonary admit her if she became critically ill. She wants airway support in [place]; we discussed local ENT docs. For now, she wants to hold off establishing someone as OPD. She is aware that if critical, ER and PCCM will coordinate that.  She also wants me to be her resouruce locally  I have agreed to her care plan     Smoking hx  reports that she has never smoked. She has  never used smokeless tobacco.   CXR Oct 2012   RADIOLOGY REPORT*  Clinical Data: Cough for 5 days. Wegener's granulomatosis.  CHEST - 2 VIEW  Comparison: None.  Findings: Emphysema is present with flattening of hemidiaphragms  and enlargement retrosternal clear space. Bronchitic changes are  present in the lower lobes bilaterally. Cardiopericardial  silhouette and mediastinal contours appear within normal limits.  Vertebral body height is preserved. No destructive osseous lesions  are identified. No plain film evidence of adenopathy.    IMPRESSION:  Hyperinflation consist with emphysema. No active cardiopulmonary  disease.  Original Report Authenticated By: Dereck Ligas, M.D.    Ok Edwards 06/18/2014  Chief Complaint  Patient presents with  . Follow-up    Pt state since last OV pt states she is feeling better. Pt states she is going to [facility] in November. Pt denies SOB, cough and CP/tightness.     Followup GPA   - She has postponed trip to [facility] due to father and father in law being sick with cancer. She did have soe headache, MRI Was normal per hx and headaches attributed to ativan weaning (she is tyrin to get herself off ativan which she has been on for decades). Shee feels wegner under remission. No hematuria or respiratory issues. Will defer labs to [facility] visit. Recent PR-3 aparently up a point. She has not had PREVNAR and will have it 06/18/2014   She did clarify to me that plan in case of airway emergency is NOT for [place] as indicated in visit June 2015 but [place] where she has new team (pripr docs have retired)     Past: She is trying to wean herself off [place] 01/22/2015  Chief Complaint  Patient presents with  . Follow-up    [facility] with [facility]. Pt stated her breathing is doing well. Pt c/o mild prod cough with clear mucus. Pt denies CP/tightness.     Follow-up GPA  Last seen October 2015. After that she went for follow-up to [facility] in [place], [place]. Overall she's doing well. No respiratory issues. Occasionally there is some voice change. This is due to subglottic stenosis. [facility]: Nov 2015 patient is concerned about the fact her B cells are low. She wants to know what this means. I will try to get in touch with  [facility]  Drs [name] attending or Dr Regan Lemming ? Fellow to answer this questionENT at [facility] evaluated her noted to have minimal erythema at right nare but stable subglottic area without inflammation ENT at [facility] evaluated her noted  to have minimal erythema at right nare but stable subglottic area without inflammation. At that visit Nov 2015 - per notes  B Cell pending but PR-3 ANCA is 0.2.  Ovverall disease GPA was felt to be in remission  Their recommendaton 1) . She also asked them about coming off prednisone. They did not that she has had prednisoen burst 4-5 ties between her visit with them April 2014 and Nov 2015 with last burst in may 2015. They have maintained equipose in recommending if she can come off prednisone but are ok wit her trying to come off which she has done at this point. 2)  monitor Pr-3 Q3 months 3) continue bactrim 1 tab daily ( I do not see it in her [facility] today may 2016 visit)   [facility] 07/10/2014 notes reviewed and things I learned are  - GPA diagnosed June 1998 due ing 2nd  prednancy.   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  - July 1998 sinus bx at [facility] - nonspecific  - Aug 1998 - left parotid bx - non specific  - Oct 1999 - c-ANCA now postive - Rx 5 weeeks  - Nov 1999 - acut closure of subglottis - Rx Cytoxan, [facility] and laser  - Dec 1999 - [facility] Appt with Dr Adan Sis Rx cytoxan and then later imuran  - Late 2002 - stop imuran and after that occ prd burst adn regular bactrim  - 2009 - recurrence with ENT symptoms: restart immuran, continue bactrim   -July 2010 - Flare up with significant nasal and subglottic disease despite high dose immuran. s/p induction rituxan with subsequent slow to reconstitute and maintained on prednisone '5mg'$  daily which seems to control her symptoms  And since then ANCA- negative  - April 2013: [place] 1 but ANCA negative (was positive prior to rituxan) - April 2014: Last seen by Dr Adan Sis prior to Nov 2015 visit. PR-3 anca negative this time - Rx Bactrim 1DS BID. Risk for relapse considered low  Past medical history since I las saw  her - Got her second reclast  infusion recently and apparently this infusion was too fast and she had some side effects and intolerance - She has tennis elbow on right side  - She also has a left fourth toe bruising/fracture being managed conservatively - tried to wean off ativan (chronic) but did not work  1) Pred started in 1998/1999 - was off of it for about 1 year in the last 18 years but has been consistently on it since beginning.  2) Pt was started in Bactrim in 1999 - has been on this daily since then at '400mg'$  daily. Pt was on '800mg'$  but decreased to '400mg'$ . Pt was told this keeps her PNA at [place]. 3) ANCA is being managed by [name] at the [facility]. Pt receives a lab kit in them mail, takes it to [facility] and once this is drawn the kit is mailed back off.    OV 09/14/2015  Chief Complaint  Patient presents with  . Follow-up    Pt recently fractured her left foot. Pt states she recently had BIL varicose vein surgery. Pt c/o prod cough with green mucus. Pt denies CP/tightness and SOB.    Follow-up granulomatous polyangiitis  Six-month follow-up. In interim has fractured her left foot particularly the left fifth metatarsal and has been in acast for the last few months/several weeks. She is under the care of Dr. [name] at [place]. She has a new primary care physician. She feels vasculitis is in remission. Last visit to [facility] was in November 2015 and current follow-up is on hold until the cast comes off which he hopes this today. After this she will start physical therapy at [place] possibly. Because of the fracture which is been deemed due to osteoporosis prednisone has been cut down to 4 mg per day by her and new endocrinologist Dr. [name]. At this point in time she has a cough which is baseline for the winter but she does not feel she is infected and overall feels vasculitis is in remission.   OV 12/28/2017  Chief  Complaint  Patient presents with  . Follow-up    Last seen 09/14/15. Pt states  oncologist wanted pt to come make a visit with MR. States she has been coughing a lot with clear to yellow mucus. pt stated she had the flu and bacterial infection beginning of April 2019.   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  It is been over 2 years since I last saw her.  In the interim in May 2018 had follow-up visit at [facility].  She reports that she saw Dr. Claire Shown national expert and this condition.  She was advised that her B cells has still undetectable/very mildly detectable.  So she continues on Bactrim.  She is on observation therapy.  Her disease is being considered [burnt out/remission].  She saw ENT Dr. Shawn Stall.  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  She states that even [facility] is advised her that only Dr. Shawn Stall at [facility] can do the surgery.  Her next visit to [facility] in [place] is scheduled somewhere between May and July 2019.  She is trying to figure out the finances for her trip now that she separated from her husband and their financial issues.  From a pulmonary perspective she reports that 3 or 4 months ago she tried to wean herself off her acid reflux medications and prednisone and when she did that successfully her cough got worse/started - new issue for me.  She was seen at [facility] for worsening arthralgia.  Prescribed Plaquenil but this did not go well with her.  She still is remaining off the Plaquenil.  Then approximately 3 or 4 weeks ago early part of April 2019 she had a bronchitis episode with colored sputum suspected influenza and was given antibiotics at this point in time the cough got worse.  She then reintroduced her acid reflux medications of PPI and H2 blockade.  After this and the treatment of her bronchitis  her cough has gotten better but still not back to full baseline.  From a energy perspective she is doing overall better.  Of note last year at [facility] her ANCA antibodies were negative  FeNO 12/28/2017 - 26 ppb and mikdly elevated in grey zone   OV 07/02/2018  Subjective:  Patient ID: Theresa [name], female , DOB: 04/20/1961 , age 73 y.o. , MRN: [id number] , ADDRESS: [place] [address]   07/02/2018 -   Chief Complaint  Patient presents with  . Follow-up    chronic cough     HPI Theresa [name] 57 y.o. -presents for follow-up of chronic cough for which inhaled steroids help.  This in the background of burned-out vasculitis where she had suppressed B cells after one course Rx with rituxan nearly a decade ago  .  In the interim she did visit with Dr.Ulrich Specks world expert and vasculitis disorders at the [facility] in [place], [place].  This was in July 2019.  I reviewed the records from [facility]. Marland Kitchen  She had CT scan of the sinuses that showed opacification and osteitis of the maxillary sinus.  There is also inward bowing of the axillary sinus.  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  This was interpreted as nonspecific in the context of previous GPA without sign of disease activity.  Patient states these findings are chronic.  At the time of her visit with him his B-cell counts were pending but she tells me that  this slowly reemerging.  Her ESR and C-reactive protein were normal.  She did show me her PR-3 levels is also slowly reemerging.  Her gammaglobulin level had improved to 904 in July 2019 at the [facility] she is trying to get hold of the [facility] to get an interpretation on this.  After this visit I also emailed Dr. Claire Shown and waiting to hear.  Her [place] vasculitis score was 0.  But patient tells me that she feels fatigued all the time particularly after coming off prednisone.  However she does not want to go back on prednisone.   Her cough is an issue and she wants a refill on the inhaled steroids.  At the [facility] she did express that acid reflux is a major problem for her.  She takes Bactrim but a bit little bit less rigid than the prescribed schedule. He has recommended GI evaluation to exclude the possibility of Barrett's esophagus or gastritis. In October 2019 she followed up with Dr. [name] ENT at [facility].  The laryngeal exam was stable and showed mild tracheal stenosis.  Continued observation therapy was recommended.  She gets her labs drawn through the oncology clinic at [facility] with Dr. [name] but the kits come from the [facility].  We discussed about doing these tests here locally but she prefers that the kit  come from the [facility] itself and Dr. [name] sense of the blood   Lab test at the [facility] - PR3:  . antibody less than 0.2 and normal throughout 2019 but in July 2019 up at 0.06 June 2018 it is up at 0.5 -she is worried about this -MPO -October 2019 [facility] were done at [place] [facility]: < 0.2 normal -CD20-B-cell percent - 1% (40)  prior to 2017 and low but now 6% (119) and ability to start or do it put additional thank you  ROS - per HPI     has a past medical history of Anxiety, Arthritis, Gastritis, GERD (gastroesophageal reflux disease), Headaches, cluster, MRSA infection, Mitral valve prolapse, Osteoporosis, Tinnitus, Varicose veins, and Wegener's granulomatosis ([place]) (1998).   reports that she has never smoked. She has never used smokeless tobacco.  Past Surgical History:  Procedure Laterality Date  . AIRWAY FOREIGN BODY REMOVAL    . ENDOVENOUS ABLATION SAPHENOUS VEIN W/ LASER Left 06-03-2015   endovenous laser ablation left greater saphenous vein and stab phlebectomy 10-20 incisions left leg by Curt Jews MD  . ENDOVENOUS ABLATION SAPHENOUS VEIN W/ LASER Right 06-24-2015   endovenous laser ablation right greater saphenous vein and stab phlebectomy 10-20  incisions   . EYE SURGERY    . mitral valve prolaspe    . NASAL SINUS SURGERY    . tear duct surgery    . TRACHEOSTOMY CLOSURE      Allergies  Allergen Reactions  . Codeine Itching  . Eletriptan Nausea Only  . Plaquenil [Hydroxychloroquine Sulfate] Other (See Comments)    itching  . Vancomycin Hives  . Tape Rash    Immunization History  Administered Date(s) Administered  . Hep B / HiB 11/02/2013  . Influenza Split 06/04/2013, 05/19/2014  . Influenza,inj,Quad PF,6+ Mos 07/20/2015  . Influenza-Unspecified 07/20/2015, 06/07/2017  . Pneumococcal Conjugate-13 06/18/2014    Family History  Problem Relation Age of Onset  . Asthma Daughter   . Irritable bowel syndrome Daughter   . Asthma Sister   . Cancer Sister   . Irritable bowel syndrome Sister   . CAD  Father   . Skin cancer Father   . Heart disease Father   . Osteoporosis Father   . COPD Father   . Prostate cancer Father   . Breast cancer Sister   . Crohn's disease Sister        or colitis  . Irritable bowel syndrome Sister   . Hypertension Mother   . Osteoporosis Mother   . Colon polyps Sister   . Irritable bowel syndrome Sister      Current Outpatient Medications:  .  albuterol (PROVENTIL HFA;VENTOLIN HFA) 108 (90 BASE) MCG/ACT inhaler, Inhale 2 puffs into the lungs every 6 (six) hours as needed. For shortness of breath or wheezing, Disp: , Rfl:  .  denosumab (PROLIA) 60 MG/ML SOSY injection, Inject 60 mg into the skin every 6 (six) months., Disp: , Rfl:  .  fluticasone (FLONASE) 50 MCG/ACT nasal spray, Place 2 sprays into the nose daily., Disp: , Rfl:  .  fluticasone (FLOVENT HFA) 110 MCG/ACT inhaler, Inhale 2 puffs into the lungs daily., Disp: , Rfl:  .  Hypertonic Nasal Wash (SINUS RINSE BOTTLE KIT NA), Place into the nose., Disp: , Rfl:  .  LORazepam (ATIVAN) 1 MG tablet, Take 1 tablet (1 mg total) by mouth 2 (two) times daily as needed for anxiety., Disp: 60 tablet, Rfl: 1 .  Mometasone Furoate (ASMANEX  HFA) 100 MCG/ACT AERO, Inhale 2 puffs into the lungs 2 (two) times daily., Disp: 1 Inhaler, Rfl: 0 .  Multiple Vitamins-Minerals (PRESERVISION AREDS 2 PO), Take by mouth., Disp: , Rfl:  .  omeprazole (PRILOSEC) 40 MG capsule, Take 1 capsule (40 mg total) by mouth daily., Disp: 90 capsule, Rfl: 1 .  sulfamethoxazole-trimethoprim (BACTRIM,SEPTRA) 400-80 MG tablet, Take 1 tablet by mouth 3 (three) times a week. , Disp: , Rfl:  .  zolpidem (AMBIEN) 10 MG tablet, Take one tablet hs prn.  Do not take with Ativan, Disp: 30 tablet, Rfl: 1      Objective:   Vitals:   07/02/18 1144  BP: 118/78  Pulse: 73  SpO2: 97%  Weight: 160 lb 3.2 oz (72.7 kg)  Height: '5\' 6"'$  (1.676 m)    Estimated body mass index is 25.86 kg/m as calculated from the following:   Height as of this encounter: '5\' 6"'$  (1.676 m).   Weight as of this encounter: 160 lb 3.2 oz (72.7 kg).  '@WEIGHTCHANGE'$ @  [employer]   07/02/18 1144  Weight: 160 lb 3.2 oz (72.7 kg)     Physical Exam  General Appearance:    Alert, cooperative, no distress, appears stated age - yes , Deconditioned looking - no , OBESE  - no, Sitting on Wheelchair -  no  Head:    Normocephalic, without obvious abnormality, atraumatic  Eyes:    PERRL, conjunctiva/corneas clear,  Ears:    Normal TM's and external ear canals, both ears  Nose:  . OXYGEN ON  - no . Patient is @ ra   Throat:   Lips, mucosa, and tongue normal; teeth and gums normal. Cyanosis on lips - no  Neck:   Supple, symmetrical, trachea midline, no adenopathy;    thyroid:  no enlargement/tenderness/nodules; no carotid   bruit or JVD. TRACH SCAR +  Back:     Symmetric, no curvature, ROM normal, no CVA tenderness  Lungs:     Distress - no , Wheeze no, Barrell Chest - no, Purse lip breathing - no, Crackles - no   Chest Wall:    No tenderness  or deformity.    Heart:    Regular rate and rhythm, S1 and S2 normal, no rub   or gallop, Murmur - no  Breast Exam:    NOT DONE  Abdomen:     Soft,  non-tender, bowel sounds active all four quadrants,    no masses, no organomegaly. Visceral obesity - no  Genitalia:   NOT DONE  Rectal:   NOT DONE  Extremities:   Extremities - normal, Has Cane - no, Clubbing - no, Edema - no  Pulses:   2+ and symmetric all extremities  Skin:   Stigmata of Connective Tissue Disease - no  Lymph nodes:   Cervical, supraclavicular, and axillary nodes normal  Psychiatric:  Neurologic:   Pleasant - yes, Anxious - no, Flat affect - no  CAm-ICU - neg, Alert and Oriented x 3 - yes, Moves all 4s - yes, Speech - normal, Cognition - intact. Mild baseline hoarse voice +_           Assessment:       ICD-10-CM   1. Chronic cough R05   2. Granulomatosis with polyangiitis, unspecified whether renal involvement ([place]) M31.30        Plan:     Patient Instructions  Chronic cough  - refill asmanex  Granulomatosis with polyangiitis, unspecified whether renal involvement ([place])  - will email Dr [name] about  - [place] doing your ANCA/B cell flow count  - implications of recent PR-3 being trace positive  - Please talk to PCP Antony Contras, MD -  and ensure you get  shingrix ([place]) inactivated vaccine against shingles  - REcomend pneumovax 07/02/2018 - if you are interested  Fatigue  - talk to PCP Antony Contras, MD  Or Dr Buddy Duty endocrine  Followup  26month or sooner    PS: note  > 50% of this > 25 min visit spent in face to face counseling or coordination of care - by this undersigned MD - Dr MBrand Males This includes one or more of the following documented above: discussion of test results, diagnostic or treatment recommendations, prognosis, risks and benefits of management options, instructions, education, compliance or risk-factor reduction   SIGNATURE    Dr. MBrand Males M.D., F.C.C.P,  Pulmonary and Critical Care Medicine Staff Physician, CDellwoodDirector - Interstitial Lung Disease  Program  [place] [address] Pager: [phone] If no answer or between  15:00h - 7:00h: call [phone] Telephone: [phone]  11:51 AM 07/02/2018

## 2018-07-03 ENCOUNTER — Ambulatory Visit: Payer: Self-pay | Admitting: Internal Medicine

## 2018-07-03 NOTE — Telephone Encounter (Signed)
Ms Theresa [name], - thanks a lot. Thanks for the compliment. I am touched and humbled. I have reached out to Dr [name] to give sense of the recent spike in PR3. Regarding quarterly testing - I have asked him this question but given your current message will leave things intact with [facility] sending kit to [name]. I fully respect and understand your perspective.

## 2018-07-04 ENCOUNTER — Telehealth: Payer: Self-pay | Admitting: Internal Medicine

## 2018-07-04 NOTE — Telephone Encounter (Signed)
PA request received from [facility] for Asmanex HFA 121mcg CMM Key: [id number] PA request has been sent to plan, and a determination is expected within 3 days.   Routing to [employer] for follow-up.

## 2018-07-05 NOTE — Telephone Encounter (Signed)
Checked on status of PA, it still pending. Will keep encounter open for follow up.

## 2018-07-09 NOTE — Telephone Encounter (Signed)
Per CMM, medication was denied and [facility] sent a fax to our office explaining why. Will check for fax tomorrow once I return to the [place] office.

## 2018-07-17 ENCOUNTER — Other Ambulatory Visit: Payer: Self-pay | Admitting: Internal Medicine

## 2018-07-17 MED ORDER — FLUTICASONE PROPIONATE HFA 220 MCG/ACT IN AERO: 2.0000 | INHALATION_SPRAY | Freq: Two times a day (BID) | RESPIRATORY_TRACT | 12 refills | Status: DC

## 2018-07-17 NOTE — Telephone Encounter (Signed)
MR please advise on patient e-mail below. Thank you.  

## 2018-07-17 NOTE — Telephone Encounter (Signed)
Triage: please do the flovent as requested by Theresa [name]

## 2018-07-22 ENCOUNTER — Inpatient Hospital Stay: Payer: Medicare Other

## 2018-10-14 ENCOUNTER — Other Ambulatory Visit: Payer: Self-pay | Admitting: *Deleted

## 2018-10-14 DIAGNOSIS — M3130 Wegener's granulomatosis without renal involvement: Secondary | ICD-10-CM

## 2018-10-21 ENCOUNTER — Encounter: Payer: Self-pay | Admitting: General Practice

## 2018-10-21 ENCOUNTER — Encounter: Payer: Self-pay | Admitting: Oncology

## 2018-10-21 ENCOUNTER — Inpatient Hospital Stay (HOSPITAL_BASED_OUTPATIENT_CLINIC_OR_DEPARTMENT_OTHER): Payer: Medicare Other | Admitting: Oncology

## 2018-10-21 ENCOUNTER — Inpatient Hospital Stay: Payer: Medicare Other | Attending: Oncology

## 2018-10-21 ENCOUNTER — Inpatient Hospital Stay: Payer: Medicare Other

## 2018-10-21 ENCOUNTER — Telehealth: Payer: Self-pay | Admitting: Oncology

## 2018-10-21 VITALS — BP 116/73 | HR 63 | Temp 98.2°F | Resp 18 | Ht 66.0 in | Wt 165.4 lb

## 2018-10-21 DIAGNOSIS — E042 Nontoxic multinodular goiter: Secondary | ICD-10-CM | POA: Insufficient documentation

## 2018-10-21 DIAGNOSIS — M816 Localized osteoporosis [Lequesne]: Secondary | ICD-10-CM

## 2018-10-21 DIAGNOSIS — M3130 Wegener's granulomatosis without renal involvement: Secondary | ICD-10-CM

## 2018-10-21 DIAGNOSIS — Z79899 Other long term (current) drug therapy: Secondary | ICD-10-CM

## 2018-10-21 DIAGNOSIS — K219 Gastro-esophageal reflux disease without esophagitis: Secondary | ICD-10-CM | POA: Insufficient documentation

## 2018-10-21 DIAGNOSIS — D8989 Other specified disorders involving the immune mechanism, not elsewhere classified: Secondary | ICD-10-CM

## 2018-10-21 DIAGNOSIS — M810 Age-related osteoporosis without current pathological fracture: Secondary | ICD-10-CM

## 2018-10-21 LAB — COMPREHENSIVE METABOLIC PANEL
ALBUMIN: 3.6 g/dL (ref 3.5–5.0)
ALT: 20 U/L (ref 0–44)
AST: 19 U/L (ref 15–41)
Alkaline Phosphatase: 39 U/L (ref 38–126)
Anion gap: 9 (ref 5–15)
BUN: 12 mg/dL (ref 6–20)
CHLORIDE: 109 mmol/L (ref 98–111)
CO2: 23 mmol/L (ref 22–32)
Calcium: 9.1 mg/dL (ref 8.9–10.3)
Creatinine, Ser: 0.81 mg/dL (ref 0.44–1.00)
GFR calc Af Amer: >60 mL/min (ref >60)
GLUCOSE: 66 mg/dL — AB (ref 70–99)
POTASSIUM: 4.2 mmol/L (ref 3.5–5.1)
Sodium: 141 mmol/L (ref 135–145)
Total Bilirubin: 0.4 mg/dL (ref 0.3–1.2)
Total Protein: 6.6 g/dL (ref 6.5–8.1)

## 2018-10-21 LAB — CBC WITH DIFFERENTIAL/PLATELET
Abs Immature Granulocytes: 0.02 10*3/uL (ref 0.00–0.07)
BASOS ABS: 0.1 10*3/uL (ref 0.0–0.1)
BASOS PCT: 1 %
Eosinophils Absolute: 0.1 10*3/uL (ref 0.0–0.5)
Eosinophils Relative: 2 %
HCT: 40.8 % (ref 36.0–46.0)
Hemoglobin: 13 g/dL (ref 12.0–15.0)
IMMATURE GRANULOCYTES: 0 %
Lymphocytes Relative: 29 %
Lymphs Abs: 2 10*3/uL (ref 0.7–4.0)
MCH: 29.4 pg (ref 26.0–34.0)
MCHC: 31.9 g/dL (ref 30.0–36.0)
MCV: 92.3 fL (ref 80.0–100.0)
Monocytes Absolute: 0.6 10*3/uL (ref 0.1–1.0)
Monocytes Relative: 8 %
NRBC: 0 % (ref 0.0–0.2)
Neutro Abs: 4 10*3/uL (ref 1.7–7.7)
Neutrophils Relative %: 60 %
PLATELETS: 314 10*3/uL (ref 150–400)
RBC: 4.42 MIL/uL (ref 3.87–5.11)
RDW: 13.2 % (ref 11.5–15.5)
WBC: 6.8 10*3/uL (ref 4.0–10.5)

## 2018-10-21 MED ORDER — DENOSUMAB 60 MG/ML ~~LOC~~ SOSY
60.0000 mg | PREFILLED_SYRINGE | Freq: Once | SUBCUTANEOUS | Status: AC
  Administered 2018-10-21: 60 mg via SUBCUTANEOUS

## 2018-10-21 MED ORDER — DENOSUMAB 60 MG/ML ~~LOC~~ SOSY
PREFILLED_SYRINGE | SUBCUTANEOUS | Status: AC
  Filled 2018-10-21: qty 1

## 2018-10-21 NOTE — Progress Notes (Signed)
ID: Theresa [name]  DOB: 05-31-1961  MR#: [id number]  CSN#: [id number]  Patient Care Team: Antony Contras, MD as PCP - General (Family Medicine) Brand Males, MD as Consulting Physician (Pulmonary Disease) Delrae Rend, MD as Consulting Physician (Endocrinology) , Virgie Dad, MD as Consulting Physician (Oncology) Ernestine Conrad, MD as Referring Physician (Otolaryngology) Clinger, Chrystie Nose, MD (Otolaryngology) Pedro Earls, MD as Attending Physician (Family Medicine) Don Broach, MD as Referring Physician (Internal Medicine) Pyrtle, Lajuan Lines, MD as Consulting Physician (Gastroenterology) OTHER MD: [name] (fax [phone]); Jaquita Rector; Danton Sewer; Kandis Ban; Huey Romans  Interval History:   Theresa [name] returns today for 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  She continues to be followed through the [facility], and her next meeting there will be in July of this year. She has also established herself with Dr. Hilarie Fredrickson at [place], with Dr. Chase Caller in pulmonary and with Dr. Zenia Resides at [facility]. She reports Dr. Zenia Resides is retired, and she has been referred to a doctor at [facility].  She continues on Prolia with good tolerance.  She is due for treatment today.  She underwent thyroid ultrasound on 02/19/2018, with results showing no significant change in the previously biopsied left thyroid nodules.  She reports she receives an annual bone density screening with Dr. Buddy Duty.   ROS:  Ariany reports shooting pains to her left flank at her breast line. She usually goes to the gym 3-4 times a week. PR3 came up high. delayed infusion due to stress. She reports her dad recently passed away with home hospice at age 57. Her parents live in [place], [place]. He had breathing issues and pneumonia. She has been helping her parents remotely since April. She was also recently informed that her 35 year old mother has breast cancer several days prior to her father  passing. She also reports on the same night she heard about her father, she got a call from her neighbor requesting help with the neighbor's husband. The husband hemmorhaged to death, blood all over the house. She assisted in cleaning the house. She's been experiencing heart palpitations. The patient denies unusual headaches, visual changes, nausea, vomiting, stiff neck, dizziness, or gait imbalance. There has been no cough, phlegm production, or pleurisy, no chest pain or pressure, and no change in bowel or bladder habits. The patient denies fever, rash, bleeding, unexplained fatigue or unexplained weight loss. A detailed review of systems was otherwise entirely negative.   History of wegener's granulomatosis:  Theresa [name] tells me her diagnosis of Wegener was initially made at the [facility] by Dr. [name] in 1999.  She has been treated in the past with Cytoxan and prednisone, methotrexate very briefly, Cytoxan orally, then Imuran for some time, and then off treatment for some years.  She was started on prednisone alone in 2009, and then in 06/2008 the azathioprine was added again at 150 mg daily, and variable doses of prednisone.  It is felt that she failed remission maintenance therapy since she could not reduce her dose of prednisone below 20 mg daily without having a resurgence of symptoms, and therefore she has been started on Rituxan with the first dose given at the [facility] on 06/24.  The patient tolerated the treatment quite well.  Her subsequent history is as detailed below  PMHx: Associated with the Wegener, the past medical history is significant for prior tracheostomy, prior sinus surgery x 2, prior right tear duct surgery at [facility] for epiphora (with  subsequent re-obstruction).  She also has a history of nasal saddling and inflammation left greater than right.  She also gets occasional thrush problems because of the steroid treatment.  Aside from the Wegener, she has a history of  osteoporosis, history of migraines, history of GERD, history of mitral valve prolapse, and a history of MRSA infection remotely (1999).    Allergies  Allergen Reactions  . Codeine Itching  . Eletriptan Nausea Only  . Plaquenil [Hydroxychloroquine Sulfate] Other (See Comments)    itching  . Vancomycin Hives  . Tape Rash    Current Outpatient Medications  Medication Sig Dispense Refill  . albuterol (PROVENTIL HFA;VENTOLIN HFA) 108 (90 BASE) MCG/ACT inhaler Inhale 2 puffs into the lungs every 6 (six) hours as needed. For shortness of breath or wheezing    . denosumab (PROLIA) 60 MG/ML SOSY injection Inject 60 mg into the skin every 6 (six) months.    . fluticasone (FLONASE) 50 MCG/ACT nasal spray Place 2 sprays into the nose daily.    . fluticasone (FLOVENT HFA) 220 MCG/ACT inhaler Inhale 2 puffs into the lungs 2 (two) times daily. 1 Inhaler 12  . Hypertonic Nasal Wash (SINUS RINSE BOTTLE KIT NA) Place into the nose.    Marland Kitchen LORazepam (ATIVAN) 1 MG tablet Take 1 tablet (1 mg total) by mouth 2 (two) times daily as needed for anxiety. 60 tablet 1  . Mometasone Furoate (ASMANEX HFA) 100 MCG/ACT AERO Inhale 2 puffs into the lungs 2 (two) times daily. 1 Inhaler 5  . Multiple Vitamins-Minerals (PRESERVISION AREDS 2 PO) Take by mouth.    Marland Kitchen omeprazole (PRILOSEC) 40 MG capsule Take 1 capsule (40 mg total) by mouth daily. 90 capsule 1  . sulfamethoxazole-trimethoprim (BACTRIM,SEPTRA) 400-80 MG tablet Take 1 tablet by mouth 3 (three) times a week.     . zolpidem (AMBIEN) 10 MG tablet Take one tablet hs prn.  Do not take with Ativan 30 tablet 1   No current facility-administered medications for this visit.     FAMILY HISTORY:  The patient's father died in 08-Oct-2018.  The patient's mother is alive in her late 49s.  She was diagnosed with what appears to be early stage breast cancer and 2020 the patient has 3 sisters.  There is no one else in the family with autoimmune disease or with  cancer.  GYNECOLOGIC HISTORY:  She is GX, P2, first pregnancy to term age 57.  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  He is living in [place] and has remarried.  The patient's son [name], age 61, is currently in [place] pursuing acting. Son [name], age 9, is going to [place] state for exercise science, and he is in transition (female to female).  The patient attends [employer] here in [place].   Objective: Middle-aged white woman who appears stated age  58:   10/21/18 1025  BP: 116/73  Pulse: 63  Resp: 18  Temp: 98.2 F (36.8 C)  SpO2: 100%    BMI: Body mass index is 26.7 kg/m.   ECOG FS: 1  Sclerae unicteric, EOMs intact No cervical or supraclavicular adenopathy Lungs no rales or rhonchi Heart regular rate and rhythm Abd soft, nontender, positive bowel sounds MSK  no focal spinal tenderness, no upper extremity lymphedema Neuro: nonfocal, well oriented, appears stressed  Breasts: Deferred  Lab Results:    Chemistry      Component Value Date/Time   NA 140 06/19/2018 1041   NA 140 08/06/2017 1335   K 4.1 06/19/2018 1041   K 4.7 08/06/2017 1335   CL 108 06/19/2018 1041   CL 106 11/07/2012 0916   CO2 24 06/19/2018 1041   CO2 26 08/06/2017 1335   BUN 16 06/19/2018 1041   BUN 17.3 08/06/2017 1335   CREATININE 0.79 06/19/2018 1041   CREATININE 0.87 10/22/2017 1001   CREATININE 0.9 08/06/2017 1335      Component Value Date/Time   CALCIUM 9.1 06/19/2018 1041   CALCIUM 9.9 08/06/2017 1335   ALKPHOS 43 06/19/2018 1041   ALKPHOS 50 08/06/2017 1335   AST 16 06/19/2018 1041   AST 18 10/22/2017 1001   AST 18 08/06/2017 1335   ALT 18 06/19/2018 1041   ALT 16 10/22/2017 1001   ALT 20 08/06/2017  1335   BILITOT 0.4 06/19/2018 1041   BILITOT 0.6 10/22/2017 1001   BILITOT 0.40 08/06/2017 1335       Lab Results  Component Value Date   WBC 6.8 10/21/2018   HGB 13.0 10/21/2018   HCT 40.8 10/21/2018   MCV 92.3 10/21/2018   PLT 314 10/21/2018   NEUTROABS 4.0 10/21/2018    Studies/Results: No results found.   Assessment: 58 y.o.  [place] woman with a history of Wegener's granulomatosis initially diagnosed in 1999, variously treated as noted above, s/p Rituxan x4 completed July 2010, currently not on prednisone maintenance.  (1) osteoporosis, treated on alendronate between 2002 and 2005, ibandronate between 2005 in 2008, then risedronate between 2008 and 2011, first dose of Reclast/zolendronate 10/24/2013  (a) DEXA scan 12/22/2012 lowest reading -2.8 at the spine, -2.1 of the femoral neck  (b) repeat bone density 2017 show worsening T score-- zolendronate discontinued 01/18/2015  (c) denosumab/Prolia started 05/02/2016  Plan: Danialle is generally stable as far as her Wegener's granulomatosis is concerned.  There will be labs sent today to [facility] and depending on that she may or may not proceed to another rituximab treatment.    She will establish herself at [facility] in rheumatology and continue to see Dr. Chase Caller here for pulmonary.  She has recently gone through a great number of stresses some of which continue.  I have contacted our chaplain to see if she can offer her some counseling support  Otherwise we are proceeding with Prolia today.  She will return in 3  months for labs and then 6 months from now for labs and Prolia, then again labs 3 months later and 6 months after that to see me again.  She knows to call for any other issues that may develop before the next visit.   , Virgie Dad, MD  10/21/18 11:04 AM Medical Oncology and Hematology [facility] [address] [place], [place] [address] Tel. [phone]    Fax. [phone]    I,  Wilburn Mylar, am acting as scribe for Dr. Virgie Dad. .  I, Lurline Del MD, have reviewed the above documentation for accuracy and completeness, and I agree with the above.

## 2018-10-21 NOTE — Progress Notes (Signed)
[place] Spiritual Care Note  Received return call from [place], who welcomed support and shared candidly for ca 30 min about her stressors, coping skills, and meaning-making work. We scheduled an initial appt for Thursday 2/20 at 10 am.   [place], [place], [facility] Pager [phone] Voicemail [phone]

## 2018-10-21 NOTE — Telephone Encounter (Signed)
Gave avs and calendar ° °

## 2018-10-21 NOTE — Progress Notes (Signed)
[facility] Spiritual Care Note  Referred by [name] for emotional support re grief and other stressors. LVM of introduction, encouraging callback.   [place], [place], [facility] Pager [phone] Voicemail [phone]

## 2018-10-24 ENCOUNTER — Encounter: Payer: Self-pay | Admitting: General Practice

## 2018-10-24 NOTE — Progress Notes (Signed)
[place] Spiritual Care Note  Met with Theresa [name] as scheduled for >1h for spiritual and emotional support. She shared extensively about concerns, both temporary and chronic, that have been on her mind lately.   At the close of the encounter, Theresa [name] verbalized great appreciation for space to process and reflect. Having a professional/neutral listener seemed very helpful and relieving, though understandably she described herself as "drained" afterward.  Theresa [name] plans to digest a bit and then phone re desired f/u plan.   [place], [place], [facility] Pager [phone] Voicemail [phone]

## 2018-11-18 ENCOUNTER — Other Ambulatory Visit: Payer: Self-pay | Admitting: Internal Medicine

## 2018-12-04 ENCOUNTER — Encounter: Payer: Self-pay | Admitting: *Deleted

## 2018-12-05 NOTE — Telephone Encounter (Signed)
Tell her thank you for her email and explanation of symptoms I would recommend we increase omeprazole to 40 mg twice a day before first and last meals I anticipate/understand that she does not want to be on a PPI medication long-term but I think this is the best option for now I would recommend a virtual visit in 3 or 4 weeks at which point I can reassess her symptoms and we can discuss other alternative therapies and whether or not an upper endoscopy should be considered Please let me know if she has additional questions based on my recommendations

## 2018-12-06 ENCOUNTER — Other Ambulatory Visit: Payer: Self-pay

## 2018-12-06 DIAGNOSIS — R197 Diarrhea, unspecified: Secondary | ICD-10-CM

## 2018-12-06 NOTE — Telephone Encounter (Signed)
Have her complete a GI pathogen panel and stool for Ova and parasite Thanks

## 2018-12-23 ENCOUNTER — Telehealth: Payer: Self-pay | Admitting: Internal Medicine

## 2018-12-23 ENCOUNTER — Other Ambulatory Visit: Payer: Self-pay

## 2018-12-23 ENCOUNTER — Ambulatory Visit (INDEPENDENT_AMBULATORY_CARE_PROVIDER_SITE_OTHER): Payer: Medicare Other | Admitting: Internal Medicine

## 2018-12-23 ENCOUNTER — Encounter: Payer: Self-pay | Admitting: Internal Medicine

## 2018-12-23 VITALS — Ht 66.0 in | Wt 158.0 lb

## 2018-12-23 DIAGNOSIS — K219 Gastro-esophageal reflux disease without esophagitis: Secondary | ICD-10-CM

## 2018-12-23 MED ORDER — DEXLANSOPRAZOLE 60 MG PO CPDR: 60.0000 mg | DELAYED_RELEASE_CAPSULE | Freq: Every day | ORAL | 1 refills | Status: DC

## 2018-12-23 NOTE — Progress Notes (Signed)
Subjective:    Patient ID: Theresa [name], female    DOB: 1961-07-05, 58 y.o.   MRN: [id number]  This service was provided via telemedicine.   The patient was located at home The provider was located in [place] office The patient did consent to this telephone visit and is aware of possible charges through their insurance for this visit.   The other persons participating in this telemedicine service were the patient and I Time spent on call: 18 min  HPI Theresa [name] is a 58 year old female with a history of GERD, gastritis without H. pylori, Wegener's granulomatosis, history of osteoporosis due to steroid use, history of headaches who is seen virtually for follow-up.  She was last seen in the office on 05/29/2018.  At the time of her last visit she was having increase in her acid reflux disease and we increased her omeprazole to 40 mg a day.  She states that despite this therapy she was having severe reflux symptoms worsened by stress.  Heartburn, sour brash and some globus sensation.  No dysphagia or odynophagia.  She had called in and I recommended twice daily but she decided just to stick with once a day and try to avoid triggers.  She is avoided spicy foods, foods like onions.  She is cut down on some of her vitamins and supplements.  She is taking a half of a multivitamin a day.  She does occasionally still use Tums and Maalox.  Heartburn again, has been a longstanding issue for her.  Separate from this she developed some intense diarrhea and she associates this with stress both with the COVID-19 pandemic but also her mother had a stroke.  We had recommended a ova and parasite panel because of an animal that she was exposed to having had tapeworm.  She did not feel comfortable coming for this test given the pandemic.  She is started twice a day probiotic with Culturelle and her diarrhea has pretty much resolved.  No rectal bleeding or melena.  Review of Systems As per HPI,  otherwise negative  Current Medications, Allergies, Past Medical History, Past Surgical History, Family History and Social History were reviewed in [employer] record.     Objective:   Physical Exam No examination, virtual visit  CBC    Component Value Date/Time   WBC 6.8 10/21/2018 1012   RBC 4.42 10/21/2018 1012   HGB 13.0 10/21/2018 1012   HGB 13.6 10/22/2017 1001   HGB 13.5 08/06/2017 1335   HCT 40.8 10/21/2018 1012   HCT 41.7 08/06/2017 1335   PLT 314 10/21/2018 1012   PLT 309 10/22/2017 1001   PLT 317 08/06/2017 1335   MCV 92.3 10/21/2018 1012   MCV 92.1 08/06/2017 1335   MCH 29.4 10/21/2018 1012   MCHC 31.9 10/21/2018 1012   RDW 13.2 10/21/2018 1012   RDW 13.2 08/06/2017 1335   LYMPHSABS 2.0 10/21/2018 1012   LYMPHSABS 2.3 08/06/2017 1335   MONOABS 0.6 10/21/2018 1012   MONOABS 0.9 08/06/2017 1335   EOSABS 0.1 10/21/2018 1012   EOSABS 0.2 08/06/2017 1335   BASOSABS 0.1 10/21/2018 1012   BASOSABS 0.1 08/06/2017 1335   CMP     Component Value Date/Time   NA 141 10/21/2018 1012   NA 140 08/06/2017 1335   K 4.2 10/21/2018 1012   K 4.7 08/06/2017 1335   CL 109 10/21/2018 1012   CL 106 11/07/2012 0916   CO2 23 10/21/2018 1012  CO2 26 08/06/2017 1335   GLUCOSE 66 (L) 10/21/2018 1012   GLUCOSE 62 (L) 08/06/2017 1335   GLUCOSE 64 (L) 11/07/2012 0916   BUN 12 10/21/2018 1012   BUN 17.3 08/06/2017 1335   CREATININE 0.81 10/21/2018 1012   CREATININE 0.87 10/22/2017 1001   CREATININE 0.9 08/06/2017 1335   CALCIUM 9.1 10/21/2018 1012   CALCIUM 9.9 08/06/2017 1335   PROT 6.6 10/21/2018 1012   PROT 7.0 08/06/2017 1335   ALBUMIN 3.6 10/21/2018 1012   ALBUMIN 3.9 08/06/2017 1335   AST 19 10/21/2018 1012   AST 18 10/22/2017 1001   AST 18 08/06/2017 1335   ALT 20 10/21/2018 1012   ALT 16 10/22/2017 1001   ALT 20 08/06/2017 1335   ALKPHOS 39 10/21/2018 1012   ALKPHOS 50 08/06/2017 1335   BILITOT 0.4 10/21/2018 1012   BILITOT 0.6  10/22/2017 1001   BILITOT 0.40 08/06/2017 1335   GFRNONAA >60 10/21/2018 1012   GFRNONAA >60 10/22/2017 1001   GFRAA >60 10/21/2018 1012   GFRAA >60 10/22/2017 1001        Assessment & Plan:   58 year old female with a history of GERD, gastritis without H. pylori, Wegener's granulomatosis, history of osteoporosis due to steroid use, history of headaches who is seen virtually for follow-up.  She was last seen in the office on 05/29/2018.   1. GERD/globus sensation --times consistent with GERD in a patient with longstanding GERD.  Still not completely controlled acid reflux.  We discussed options for medical therapy.  I am going to change her to Dexilant 60 mg a day.  She can use Pepcid nightly as needed for breakthrough.  After the pandemic I recommend upper endoscopy.  Continue GERD diet and lifestyle modification --Discontinue omeprazole --Begin Dexilant 60 mg daily --Can try Pepcid 20 mg in the evening if needed for breakthrough heartburn --GERD diet and lifestyle modification, which she is already doing --EGD when pandemic situation improves and [facility] restrictions are lifted (please place on wait list)  2.  Diarrhea --improved at this point with probiotic.  She is asked to notify me if this recurs and I would want her to do stool studies including GI pathogen panel and ova and parasite examination  3.  CRC screening --up-to-date, repeat screening recommended June 2024  Follow-up virtual visit in 4 to 6 weeks

## 2018-12-23 NOTE — Addendum Note (Signed)
Addended by: Larina Bras on: 12/23/2018 05:17 PM   Modules accepted: Orders

## 2018-12-23 NOTE — Telephone Encounter (Signed)
Medication was sent after office visit. See office encounter.

## 2018-12-23 NOTE — Telephone Encounter (Signed)
Patient called said that Dr. Hilarie Fredrickson was sending over dexilant 60mg .. She would like to know if it is going to be sent in? pahrmacy [facility]

## 2018-12-23 NOTE — Telephone Encounter (Signed)
Medicine was sent during office encounter. See office note.

## 2018-12-23 NOTE — Patient Instructions (Addendum)
Please continue GERD diet and lifestyle modification.  We will contact you to schedule endoscopy one COVID-19 restrictions have been lifted. If you do not hear from [place] by July 2020, please call our office at [phone] to inquire.  We have sent the following medications to your pharmacy for you to pick up at your convenience: Dexilant 60 mg daily (in place of omeprazole)  Please purchase the following medications over the counter and take as directed: Pepcid 60 mg every night as needed for breakthrough reflux.  Please let us know should your diarrhea return. We may need you to complete some stool studies.  You will be due for a recall colonoscopy in 02/2023. We will send you a reminder in the mail when it gets closer to that time.  You have been scheduled for a TELEPHONE visit with Dr Hilarie Fredrickson on 01/24/19 at 1:30 pm.

## 2019-01-01 MED ORDER — DEXLANSOPRAZOLE 60 MG PO CPDR: 60.0000 mg | DELAYED_RELEASE_CAPSULE | Freq: Every day | ORAL | 3 refills | Status: DC

## 2019-01-01 NOTE — Addendum Note (Signed)
Addended by: Larina Bras on: 01/01/2019 01:09 PM   Modules accepted: Orders

## 2019-01-07 ENCOUNTER — Telehealth: Payer: Self-pay | Admitting: *Deleted

## 2019-01-07 NOTE — Telephone Encounter (Signed)
Left message for patient to call back. We are now able to schedule endoscopy for her since COVID restrictions are being lifted.

## 2019-01-08 NOTE — Telephone Encounter (Signed)
Patient calling back and states that she does not want to have procedure at this time. She says that she is uncomfortable coming in even though Covid restrictions have been lifted. She is requesting to speak to [facility] about this.  Also, patient states that our office can call [facility] and give dexilant info to them to see if they will pay. She says that Dexilant rx is very expensive. She says that we need to ask for a tier exception. [employer] [phone]

## 2019-01-15 NOTE — Telephone Encounter (Signed)
I have advised patient that tier exception form has been faxed to [employer] at fax [phone] on her behalf. She indicates that she got patient assistance forms that we filled out as well.  In addition, I have reminded her that she should call back to schedule endoscopy as soon as she is comfortable doing so (she previously indicated that she is not comfortable yet scheduling even though we are lifting COVID restrictions a bit). She verbalizes understanding.

## 2019-01-16 ENCOUNTER — Telehealth: Payer: Self-pay | Admitting: *Deleted

## 2019-01-16 ENCOUNTER — Other Ambulatory Visit: Payer: Self-pay | Admitting: *Deleted

## 2019-01-16 DIAGNOSIS — N644 Mastodynia: Secondary | ICD-10-CM

## 2019-01-16 DIAGNOSIS — M3130 Wegener's granulomatosis without renal involvement: Secondary | ICD-10-CM

## 2019-01-16 DIAGNOSIS — Z803 Family history of malignant neoplasm of breast: Secondary | ICD-10-CM

## 2019-01-16 NOTE — Telephone Encounter (Signed)
This RN spoke with pt per her call regarding she is due for her mammogram- and due to ongoing left breast pain - [facility] stated possible need for diagnostic mammogram.  She is scheduled for mammo on Monday 5/18 post lab visit.  To obtain a diagnostic she would need a referral.  Above discussed including pt understanding diagnostic may be at a greater cost - Zakaria states she understands and due to ongoing pain and sister and mother both have had breast cancer she would " just have peace of mind " with diagnostic.  Order for bilateral diagnostic with [facility] order faxed to [facility].

## 2019-01-20 ENCOUNTER — Inpatient Hospital Stay: Payer: Medicare Other | Attending: Oncology

## 2019-01-20 ENCOUNTER — Other Ambulatory Visit: Payer: Self-pay

## 2019-01-20 DIAGNOSIS — D8989 Other specified disorders involving the immune mechanism, not elsewhere classified: Secondary | ICD-10-CM

## 2019-01-20 DIAGNOSIS — M359 Systemic involvement of connective tissue, unspecified: Secondary | ICD-10-CM | POA: Insufficient documentation

## 2019-01-20 LAB — CBC WITH DIFFERENTIAL/PLATELET
Abs Immature Granulocytes: 0.01 10*3/uL (ref 0.00–0.07)
Basophils Absolute: 0.1 10*3/uL (ref 0.0–0.1)
Basophils Relative: 1 %
Eosinophils Absolute: 0.2 10*3/uL (ref 0.0–0.5)
Eosinophils Relative: 3 %
HCT: 41.8 % (ref 36.0–46.0)
Hemoglobin: 13.4 g/dL (ref 12.0–15.0)
Immature Granulocytes: 0 %
Lymphocytes Relative: 36 %
Lymphs Abs: 2.4 10*3/uL (ref 0.7–4.0)
MCH: 29.6 pg (ref 26.0–34.0)
MCHC: 32.1 g/dL (ref 30.0–36.0)
MCV: 92.5 fL (ref 80.0–100.0)
Monocytes Absolute: 0.7 10*3/uL (ref 0.1–1.0)
Monocytes Relative: 11 %
Neutro Abs: 3.3 10*3/uL (ref 1.7–7.7)
Neutrophils Relative %: 49 %
Platelets: 310 10*3/uL (ref 150–400)
RBC: 4.52 MIL/uL (ref 3.87–5.11)
RDW: 13.2 % (ref 11.5–15.5)
WBC: 6.8 10*3/uL (ref 4.0–10.5)
nRBC: 0 % (ref 0.0–0.2)

## 2019-01-20 LAB — COMPREHENSIVE METABOLIC PANEL
ALT: 20 U/L (ref 0–44)
AST: 20 U/L (ref 15–41)
Albumin: 3.5 g/dL (ref 3.5–5.0)
Alkaline Phosphatase: 43 U/L (ref 38–126)
Anion gap: 7 (ref 5–15)
BUN: 15 mg/dL (ref 6–20)
CO2: 24 mmol/L (ref 22–32)
Calcium: 9.2 mg/dL (ref 8.9–10.3)
Chloride: 108 mmol/L (ref 98–111)
Creatinine, Ser: 0.83 mg/dL (ref 0.44–1.00)
GFR calc Af Amer: >60 mL/min (ref >60)
GFR calc non Af Amer: >60 mL/min (ref >60)
Glucose, Bld: 77 mg/dL (ref 70–99)
Potassium: 4.3 mmol/L (ref 3.5–5.1)
Sodium: 139 mmol/L (ref 135–145)
Total Bilirubin: 0.4 mg/dL (ref 0.3–1.2)
Total Protein: 6.9 g/dL (ref 6.5–8.1)

## 2019-01-20 NOTE — Telephone Encounter (Signed)
We have received faxed approval letter from [facility] [employer]" indicating that patient has been approved for free medication until 09/04/19. Case #[id number].

## 2019-01-21 NOTE — Telephone Encounter (Signed)
Noted --This medication is in the same class as omeprazole, should not have any worse deleterious effect on bone; these medications can interfere with bone metabolism --I would not add any specific supplement

## 2019-01-23 ENCOUNTER — Ambulatory Visit: Payer: Medicare Other | Admitting: Internal Medicine

## 2019-01-24 ENCOUNTER — Ambulatory Visit: Payer: Self-pay | Admitting: Internal Medicine

## 2019-01-31 ENCOUNTER — Encounter: Payer: Self-pay | Admitting: Oncology

## 2019-02-12 ENCOUNTER — Inpatient Hospital Stay: Payer: Medicare Other | Attending: Oncology

## 2019-02-12 ENCOUNTER — Other Ambulatory Visit: Payer: Self-pay

## 2019-02-12 DIAGNOSIS — M3130 Wegener's granulomatosis without renal involvement: Secondary | ICD-10-CM | POA: Diagnosis not present

## 2019-02-12 DIAGNOSIS — Z79899 Other long term (current) drug therapy: Secondary | ICD-10-CM | POA: Diagnosis present

## 2019-02-12 DIAGNOSIS — D8989 Other specified disorders involving the immune mechanism, not elsewhere classified: Secondary | ICD-10-CM

## 2019-02-12 LAB — CBC WITH DIFFERENTIAL/PLATELET
Abs Immature Granulocytes: 0.02 10*3/uL (ref 0.00–0.07)
Basophils Absolute: 0.1 10*3/uL (ref 0.0–0.1)
Basophils Relative: 1 %
Eosinophils Absolute: 0.4 10*3/uL (ref 0.0–0.5)
Eosinophils Relative: 5 %
HCT: 41.1 % (ref 36.0–46.0)
Hemoglobin: 13.1 g/dL (ref 12.0–15.0)
Immature Granulocytes: 0 %
Lymphocytes Relative: 36 %
Lymphs Abs: 2.8 10*3/uL (ref 0.7–4.0)
MCH: 28.9 pg (ref 26.0–34.0)
MCHC: 31.9 g/dL (ref 30.0–36.0)
MCV: 90.7 fL (ref 80.0–100.0)
Monocytes Absolute: 0.7 10*3/uL (ref 0.1–1.0)
Monocytes Relative: 10 %
Neutro Abs: 3.9 10*3/uL (ref 1.7–7.7)
Neutrophils Relative %: 48 %
Platelets: 321 10*3/uL (ref 150–400)
RBC: 4.53 MIL/uL (ref 3.87–5.11)
RDW: 13 % (ref 11.5–15.5)
WBC: 7.8 10*3/uL (ref 4.0–10.5)
nRBC: 0 % (ref 0.0–0.2)

## 2019-02-12 LAB — COMPREHENSIVE METABOLIC PANEL
ALT: 17 U/L (ref 0–44)
AST: 19 U/L (ref 15–41)
Albumin: 3.7 g/dL (ref 3.5–5.0)
Alkaline Phosphatase: 39 U/L (ref 38–126)
Anion gap: 9 (ref 5–15)
BUN: 18 mg/dL (ref 6–20)
CO2: 24 mmol/L (ref 22–32)
Calcium: 9.5 mg/dL (ref 8.9–10.3)
Chloride: 105 mmol/L (ref 98–111)
Creatinine, Ser: 0.9 mg/dL (ref 0.44–1.00)
GFR calc Af Amer: >60 mL/min (ref >60)
GFR calc non Af Amer: >60 mL/min (ref >60)
Glucose, Bld: 73 mg/dL (ref 70–99)
Potassium: 4.4 mmol/L (ref 3.5–5.1)
Sodium: 138 mmol/L (ref 135–145)
Total Bilirubin: 0.5 mg/dL (ref 0.3–1.2)
Total Protein: 7.1 g/dL (ref 6.5–8.1)

## 2019-02-20 ENCOUNTER — Other Ambulatory Visit: Payer: Self-pay

## 2019-02-20 ENCOUNTER — Telehealth: Payer: Self-pay

## 2019-02-20 ENCOUNTER — Encounter: Payer: Self-pay | Admitting: Oncology

## 2019-02-20 DIAGNOSIS — M3130 Wegener's granulomatosis without renal involvement: Secondary | ICD-10-CM

## 2019-02-20 NOTE — Telephone Encounter (Signed)
Pt called requesting a covid swab and antibody test prior to her visit at the [facility] in July -as per their protocol. Per [name] orders placed and appt made for covid test. Informed pt that antibody screening is not currently being done here. Pt aware of appt date, time for covid test.

## 2019-02-21 NOTE — Telephone Encounter (Signed)
Will close this encounter.  

## 2019-03-01 ENCOUNTER — Encounter: Payer: Self-pay | Admitting: Oncology

## 2019-03-04 ENCOUNTER — Other Ambulatory Visit: Payer: Self-pay

## 2019-03-04 DIAGNOSIS — M3130 Wegener's granulomatosis without renal involvement: Secondary | ICD-10-CM

## 2019-03-04 NOTE — Telephone Encounter (Signed)
1. My apologies for delayed respoinse  2. I am 00% certain the labs here  are all CLIA certified. They have to be to be a lab  3. The COVID NAA testing being done is national level - Cephi[facility] testing and [facility] or [facility] - so I do not doubt t heir integrity  4. In my view hre ooptions are a) - she can have covid testing done here and travel to [facility] or B) do the tests they need for her wegner - like PR-3 etc., all locally based on their advice and do a video visit with [facility] and then I can see her face to face to give extra support. I think B might be better option     SIGNATURE    Dr. Brand Males, M.D., F.C.C.P,  Pulmonary and Critical Care Medicine Staff Physician, [place] Director - Interstitial Lung Disease  Program  Pulmonary [place] at [place], [place], [address]  Pager: [phone], If no answer or between  15:00h - 7:00h: call [phone] Telephone: [phone]  2:39 PM 03/04/2019

## 2019-03-10 ENCOUNTER — Other Ambulatory Visit: Payer: Self-pay

## 2019-03-10 LAB — NOVEL CORONAVIRUS, NAA: SARS-CoV-2, NAA: NOT DETECTED

## 2019-03-10 NOTE — Telephone Encounter (Signed)
Received MyChart message from pt addressed to MR. Because MR is not currently in the office today, will route to provider of the day. Here is the patient's original message:   "Hi Dr. Chase Caller,  Thank you! I am still deciding but will know within the next day whether I will continue with my plans to the [facility]. The only airlines I can take is [employer] which is seating at full capacity and I have not received my covid results from last Tuesday.   Also, I had a covid swab test at the [facility] [place]. The nurse did a different type swab than I expected to receive. She used what looked like a regular size qtip and swabbed just the superficial front area of both nostrils. Not a nasal pharyngeal swab. She said they were instructed to use the deeper swab for very sick individuals because they were running out of those tests. I was told initially my results would be back in 48 hours but when the test was performed the nurse said it would take up to five or more days for the results. The 48 hour results are for the deeper swab. I mentioned to her that the deeper swab was the one ordered and why it was ordered but she said she had to follow what she was told and only give that particular test to sick individuals. She was very understanding but said she couldn't. Does this newer swab have the same efficacy as the deeper one?  Rilynn"  Routing to APP of the day.  TP, please advise with your recommendations for this pt. Thank you.

## 2019-03-10 NOTE — Telephone Encounter (Signed)
Pt verbalized understanding to TP's recent MyChart response and states she will not be driving to [facility]. Pt would like MR to know the following,   "Please let Dr. Chase Caller know I have canceled my [facility] trip and Dr. Claire Shown office from the [facility] has been given Dr. Golden Pop contact info as well as Dr. [name] from [facility]. [name] facilitates my [facility] labs every 3 months.  Thank you.  Theresa [name]"  Routing to MR as an Pharmacist, hospital. Nothing further needed at this time.

## 2019-03-10 NOTE — Telephone Encounter (Signed)
COVID test is negative from 03/04/19  It is an accurate test  Dr. Chase Caller has already replied.  Can she drive to [facility], is this an option ? Would be better than flying .  If fly, need to wear face shield and mask , strict hand washing .   Hope this helps.

## 2019-03-11 ENCOUNTER — Telehealth: Payer: Self-pay | Admitting: *Deleted

## 2019-03-11 NOTE — Telephone Encounter (Signed)
Theresa [name] left VM on 7/6 stating she will not be proceeding to the [facility] for evaluation due to Covid concerns and traveling due to the airline that accomodates her location is now booking a full capacity. 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 She states she can do a virtual visit with [facility] but " they want additional labs and test done prior for evaluation and said they were sending request to [name] "  Per returning VM - this RN obtained identified VM for Theresa [name]- message left that request has not been received at this time. This RN direct fax number given for request as well as Theresa [name] can return call and give phone number for contact with [facility] for communication of needs.

## 2019-03-13 ENCOUNTER — Telehealth: Payer: Self-pay | Admitting: Oncology

## 2019-03-13 ENCOUNTER — Other Ambulatory Visit: Payer: Self-pay | Admitting: *Deleted

## 2019-03-13 DIAGNOSIS — M3130 Wegener's granulomatosis without renal involvement: Secondary | ICD-10-CM

## 2019-03-13 NOTE — Progress Notes (Signed)
This RN received orders faxed from Dr Boyd Kerbs at [facility] for labs and xray for pending virtual visit at their facility.  Urgent appt request sent - orders entered.

## 2019-03-13 NOTE — Telephone Encounter (Signed)
Scheduled appt per 7/09 sch message. Pt is aware of appt date and time .

## 2019-03-18 ENCOUNTER — Inpatient Hospital Stay: Payer: Medicare Other | Attending: Oncology

## 2019-03-18 ENCOUNTER — Other Ambulatory Visit: Payer: Self-pay

## 2019-03-18 DIAGNOSIS — Z79899 Other long term (current) drug therapy: Secondary | ICD-10-CM | POA: Diagnosis not present

## 2019-03-18 DIAGNOSIS — M3130 Wegener's granulomatosis without renal involvement: Secondary | ICD-10-CM | POA: Diagnosis present

## 2019-03-18 LAB — URINALYSIS, COMPLETE (UACMP) WITH MICROSCOPIC
Bacteria, UA: NONE SEEN
Bilirubin Urine: NEGATIVE
Glucose, UA: NEGATIVE mg/dL
Hgb urine dipstick: NEGATIVE
Ketones, ur: NEGATIVE mg/dL
Leukocytes,Ua: NEGATIVE
Nitrite: NEGATIVE
Protein, ur: NEGATIVE mg/dL
Specific Gravity, Urine: 1.005 (ref 1.005–1.030)
pH: 6 (ref 5.0–8.0)

## 2019-03-18 LAB — CMP (CANCER CENTER ONLY)
ALT: 18 U/L (ref 0–44)
AST: 17 U/L (ref 15–41)
Albumin: 3.8 g/dL (ref 3.5–5.0)
Alkaline Phosphatase: 46 U/L (ref 38–126)
Anion gap: 8 (ref 5–15)
BUN: 13 mg/dL (ref 6–20)
CO2: 27 mmol/L (ref 22–32)
Calcium: 9.1 mg/dL (ref 8.9–10.3)
Chloride: 106 mmol/L (ref 98–111)
Creatinine: 0.84 mg/dL (ref 0.44–1.00)
GFR, Est AFR Am: >60 mL/min (ref >60)
GFR, Estimated: >60 mL/min (ref >60)
Glucose, Bld: 85 mg/dL (ref 70–99)
Potassium: 4.3 mmol/L (ref 3.5–5.1)
Sodium: 141 mmol/L (ref 135–145)
Total Bilirubin: 0.4 mg/dL (ref 0.3–1.2)
Total Protein: 7.3 g/dL (ref 6.5–8.1)

## 2019-03-18 LAB — CBC WITH DIFFERENTIAL (CANCER CENTER ONLY)
Abs Immature Granulocytes: 0.02 10*3/uL (ref 0.00–0.07)
Basophils Absolute: 0.1 10*3/uL (ref 0.0–0.1)
Basophils Relative: 1 %
Eosinophils Absolute: 0.4 10*3/uL (ref 0.0–0.5)
Eosinophils Relative: 5 %
HCT: 42.3 % (ref 36.0–46.0)
Hemoglobin: 13.3 g/dL (ref 12.0–15.0)
Immature Granulocytes: 0 %
Lymphocytes Relative: 30 %
Lymphs Abs: 2.2 10*3/uL (ref 0.7–4.0)
MCH: 28.8 pg (ref 26.0–34.0)
MCHC: 31.4 g/dL (ref 30.0–36.0)
MCV: 91.6 fL (ref 80.0–100.0)
Monocytes Absolute: 0.8 10*3/uL (ref 0.1–1.0)
Monocytes Relative: 10 %
Neutro Abs: 4 10*3/uL (ref 1.7–7.7)
Neutrophils Relative %: 54 %
Platelet Count: 334 10*3/uL (ref 150–400)
RBC: 4.62 MIL/uL (ref 3.87–5.11)
RDW: 13 % (ref 11.5–15.5)
WBC Count: 7.4 10*3/uL (ref 4.0–10.5)
nRBC: 0 % (ref 0.0–0.2)

## 2019-03-18 LAB — URIC ACID: Uric Acid, Serum: 3.7 mg/dL (ref 2.5–7.1)

## 2019-03-18 LAB — PHOSPHORUS: Phosphorus: 4.3 mg/dL (ref 2.5–4.6)

## 2019-03-18 LAB — SEDIMENTATION RATE: Sed Rate: 19 mm/hr (ref 0–22)

## 2019-03-18 LAB — C-REACTIVE PROTEIN: CRP: <0.8 mg/dL (ref <1.0)

## 2019-03-19 ENCOUNTER — Other Ambulatory Visit: Payer: Self-pay | Admitting: Internal Medicine

## 2019-03-19 ENCOUNTER — Telehealth: Payer: Self-pay | Admitting: Internal Medicine

## 2019-03-19 NOTE — Telephone Encounter (Signed)
Patient called wanting to refill omeprazole 40mg 

## 2019-03-20 ENCOUNTER — Other Ambulatory Visit: Payer: Self-pay | Admitting: *Deleted

## 2019-03-20 DIAGNOSIS — M3130 Wegener's granulomatosis without renal involvement: Secondary | ICD-10-CM

## 2019-03-20 NOTE — Telephone Encounter (Signed)
Patient indicates that she tried Dexilant 60 mg x 2 weeks and felt that she had "too much acid building up" in her stomach, so she switched back to omeprazole. She states that the omeprazole is working okay but she has ran out. She states that she wonders if she should go down to Dexilant 30 mg because "her body sometimes has the opposite effect with medications" so she thinks she may need the lower dose.  Dr Hilarie Fredrickson, please advise... Do you want me to give her omeprazole again or have her come for office visit to discuss?

## 2019-03-21 ENCOUNTER — Ambulatory Visit
Admission: RE | Admit: 2019-03-21 | Discharge: 2019-03-21 | Disposition: A | Discharge status: Home / Self Care | Payer: Medicare Other | Source: Ambulatory Visit | Attending: Oncology | Admitting: Oncology

## 2019-03-21 ENCOUNTER — Other Ambulatory Visit: Payer: Self-pay

## 2019-03-21 DIAGNOSIS — M3130 Wegener's granulomatosis without renal involvement: Secondary | ICD-10-CM

## 2019-03-21 MED ORDER — OMEPRAZOLE 40 MG PO CPDR: 40.0000 mg | DELAYED_RELEASE_CAPSULE | Freq: Every day | ORAL | 2 refills | Status: DC

## 2019-03-21 MED ORDER — DEXILANT 30 MG PO CPDR: 30.0000 mg | DELAYED_RELEASE_CAPSULE | Freq: Every day | ORAL | 2 refills | Status: DC

## 2019-03-21 NOTE — Telephone Encounter (Signed)
Ok to try to lower dose of dexilant or go back to omeprazole (either way ok), but lower dose of dexilant medically speaking, should be less efficacious than the higher dose Ok to try

## 2019-03-21 NOTE — Telephone Encounter (Signed)
Patient states that she would like to go back to omeprazole for now. She would like to "do her homework" to see if she can get the lower dose Dexilant through [facility] and will let us know if she can go that route.

## 2019-03-24 ENCOUNTER — Telehealth: Payer: Self-pay | Admitting: Internal Medicine

## 2019-03-24 ENCOUNTER — Other Ambulatory Visit: Payer: Self-pay

## 2019-03-24 MED ORDER — OMEPRAZOLE 40 MG PO CPDR: 40.0000 mg | DELAYED_RELEASE_CAPSULE | Freq: Every day | ORAL | 2 refills | Status: DC

## 2019-03-24 NOTE — Telephone Encounter (Signed)
If she is doing well on omeprazole it is okay to go back to this previous prescription I do not feel that she needs to try Dexilant if omeprazole is working well

## 2019-03-24 NOTE — Telephone Encounter (Signed)
Theresa [name] sent a message (Staff) canceling her [facility] visit and getting labs locally via [name]. Please confirm that she has schedule with [name] for labs and please ask her when she wants to visit pulm clinic next

## 2019-03-24 NOTE — Telephone Encounter (Signed)
Called patient: She had labs drawn with Dr. [name] last week. She has some labs from the [facility] that she will send for you. She has a televisit coming up with [facility] and then she will schedule appt with Pulm in August.

## 2019-04-03 NOTE — Telephone Encounter (Signed)
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 Brand Males, M.D., F.C.C.P,  Pulmonary and Critical Care Medicine Staff Physician, [place] Director - Interstitial Lung Disease  Program  Pulmonary [place] at [place], [place], [address]  Pager: [phone], If no answer or between  15:00h - 7:00h: call [phone] Telephone: [phone]  6:12 PM 04/03/2019

## 2019-04-03 NOTE — Telephone Encounter (Signed)
Updated 6:14 PM 04/03/2019 - spoke to [name] - says not spoken to Dr [name]. - has appt 04/24/2019.  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 Also having fatigue worse.  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 Nasal swab 6/3-0/2020 - negative. Says overall is moderate. 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   - suggested she try to resolve with [facility] by 04/14/2019     SIGNATURE    Dr. Brand Males, M.D., F.C.C.P,  Pulmonary and Critical Care Medicine Staff Physician, [place] Director - Interstitial Lung Disease  Program  Pulmonary [place] at [place], [place], [address]  Pager: [phone], If no answer or between  15:00h - 7:00h: call [phone] Telephone: [phone]  6:22 PM 04/03/2019

## 2019-04-15 ENCOUNTER — Encounter: Payer: Self-pay | Admitting: Oncology

## 2019-04-16 NOTE — Telephone Encounter (Signed)
Dear Ms Theresa [name] thanks for the update. Ok let us see what is eval in 4 weeks show [place]  Thanks    SIGNATURE    Dr. Brand Males, M.D., F.C.C.P,  Pulmonary and Critical Care Medicine Staff Physician, [place] Director - Interstitial Lung Disease  Program  Pulmonary [place] at [place], [place], [address]  Pager: [phone], If no answer or between  15:00h - 7:00h: call [phone] Telephone: [phone]  2:49 PM 04/16/2019

## 2019-04-17 ENCOUNTER — Other Ambulatory Visit: Payer: Self-pay | Admitting: Oncology

## 2019-04-17 DIAGNOSIS — M3130 Wegener's granulomatosis without renal involvement: Secondary | ICD-10-CM

## 2019-04-21 ENCOUNTER — Telehealth: Payer: Self-pay | Admitting: Internal Medicine

## 2019-04-21 ENCOUNTER — Other Ambulatory Visit: Payer: Medicare Other

## 2019-04-21 ENCOUNTER — Other Ambulatory Visit: Payer: Self-pay

## 2019-04-21 DIAGNOSIS — R197 Diarrhea, unspecified: Secondary | ICD-10-CM

## 2019-04-21 NOTE — Telephone Encounter (Signed)
Per last OV note pt was to have Ova and parasite along with GI pathogen panel done. Orders are in epic and pt will come to the lab for kit. States she has been on some antibiotics for a sinus infection and she is still having issues with diarrhea. 

## 2019-04-22 ENCOUNTER — Inpatient Hospital Stay: Payer: Medicare Other | Attending: Oncology

## 2019-04-22 DIAGNOSIS — M3130 Wegener's granulomatosis without renal involvement: Secondary | ICD-10-CM | POA: Insufficient documentation

## 2019-04-23 ENCOUNTER — Inpatient Hospital Stay: Payer: Medicare Other

## 2019-04-23 ENCOUNTER — Other Ambulatory Visit: Payer: Self-pay

## 2019-04-23 ENCOUNTER — Other Ambulatory Visit: Payer: Self-pay | Admitting: *Deleted

## 2019-04-23 DIAGNOSIS — M3130 Wegener's granulomatosis without renal involvement: Secondary | ICD-10-CM | POA: Diagnosis present

## 2019-04-23 LAB — CBC WITH DIFFERENTIAL/PLATELET
Abs Immature Granulocytes: 0.02 10*3/uL (ref 0.00–0.07)
Basophils Absolute: 0.1 10*3/uL (ref 0.0–0.1)
Basophils Relative: 1 %
Eosinophils Absolute: 0.2 10*3/uL (ref 0.0–0.5)
Eosinophils Relative: 2 %
HCT: 39.2 % (ref 36.0–46.0)
Hemoglobin: 12.6 g/dL (ref 12.0–15.0)
Immature Granulocytes: 0 %
Lymphocytes Relative: 24 %
Lymphs Abs: 2.4 10*3/uL (ref 0.7–4.0)
MCH: 29.4 pg (ref 26.0–34.0)
MCHC: 32.1 g/dL (ref 30.0–36.0)
MCV: 91.6 fL (ref 80.0–100.0)
Monocytes Absolute: 0.7 10*3/uL (ref 0.1–1.0)
Monocytes Relative: 7 %
Neutro Abs: 6.7 10*3/uL (ref 1.7–7.7)
Neutrophils Relative %: 66 %
Platelets: 323 10*3/uL (ref 150–400)
RBC: 4.28 MIL/uL (ref 3.87–5.11)
RDW: 13.1 % (ref 11.5–15.5)
WBC: 10.1 10*3/uL (ref 4.0–10.5)
nRBC: 0 % (ref 0.0–0.2)

## 2019-04-23 LAB — COMPREHENSIVE METABOLIC PANEL
ALT: 22 U/L (ref 0–44)
AST: 21 U/L (ref 15–41)
Albumin: 3.6 g/dL (ref 3.5–5.0)
Alkaline Phosphatase: 49 U/L (ref 38–126)
Anion gap: 8 (ref 5–15)
BUN: 17 mg/dL (ref 6–20)
CO2: 23 mmol/L (ref 22–32)
Calcium: 9 mg/dL (ref 8.9–10.3)
Chloride: 108 mmol/L (ref 98–111)
Creatinine, Ser: 1.02 mg/dL — ABNORMAL HIGH (ref 0.44–1.00)
GFR calc Af Amer: >60 mL/min (ref >60)
GFR calc non Af Amer: >60 mL/min (ref >60)
Glucose, Bld: 85 mg/dL (ref 70–99)
Potassium: 4.1 mmol/L (ref 3.5–5.1)
Sodium: 139 mmol/L (ref 135–145)
Total Bilirubin: 0.3 mg/dL (ref 0.3–1.2)
Total Protein: 6.7 g/dL (ref 6.5–8.1)

## 2019-04-23 LAB — SEDIMENTATION RATE: Sed Rate: 12 mm/hr (ref 0–22)

## 2019-04-23 LAB — URINALYSIS, COMPLETE (UACMP) WITH MICROSCOPIC
Bacteria, UA: NONE SEEN
Bilirubin Urine: NEGATIVE
Glucose, UA: NEGATIVE mg/dL
Hgb urine dipstick: NEGATIVE
Ketones, ur: NEGATIVE mg/dL
Leukocytes,Ua: NEGATIVE
Nitrite: NEGATIVE
Protein, ur: NEGATIVE mg/dL
Specific Gravity, Urine: 1.008 (ref 1.005–1.030)
pH: 5 (ref 5.0–8.0)

## 2019-04-23 LAB — SAVE SMEAR(SSMR), FOR PROVIDER SLIDE REVIEW

## 2019-04-23 LAB — TECHNOLOGIST SMEAR REVIEW

## 2019-04-23 LAB — C-REACTIVE PROTEIN: CRP: <0.8 mg/dL (ref <1.0)

## 2019-04-29 ENCOUNTER — Encounter: Payer: Self-pay | Admitting: Oncology

## 2019-05-01 NOTE — Telephone Encounter (Signed)
Let pt know that her recent antibiotics would likely interfere with test result and thus I would wait 2-3 weeks after all abx are complete to submit the test I hope she is feeling better

## 2019-05-14 ENCOUNTER — Other Ambulatory Visit: Payer: Self-pay | Admitting: Internal Medicine

## 2019-05-14 DIAGNOSIS — E041 Nontoxic single thyroid nodule: Secondary | ICD-10-CM

## 2019-05-14 DIAGNOSIS — M810 Age-related osteoporosis without current pathological fracture: Secondary | ICD-10-CM

## 2019-05-19 ENCOUNTER — Other Ambulatory Visit: Payer: Medicare Other

## 2019-05-20 ENCOUNTER — Other Ambulatory Visit: Payer: Self-pay | Admitting: *Deleted

## 2019-05-20 DIAGNOSIS — M3130 Wegener's granulomatosis without renal involvement: Secondary | ICD-10-CM

## 2019-05-22 ENCOUNTER — Ambulatory Visit
Admission: RE | Admit: 2019-05-22 | Discharge: 2019-05-22 | Disposition: A | Discharge status: Home / Self Care | Payer: Medicare Other | Source: Ambulatory Visit | Attending: Internal Medicine | Admitting: Internal Medicine

## 2019-05-22 DIAGNOSIS — E041 Nontoxic single thyroid nodule: Secondary | ICD-10-CM

## 2019-05-26 ENCOUNTER — Other Ambulatory Visit: Payer: Medicare Other

## 2019-05-26 ENCOUNTER — Other Ambulatory Visit: Payer: Self-pay

## 2019-05-26 ENCOUNTER — Inpatient Hospital Stay: Payer: Medicare Other | Attending: Oncology

## 2019-05-26 DIAGNOSIS — R197 Diarrhea, unspecified: Secondary | ICD-10-CM

## 2019-05-26 DIAGNOSIS — M3130 Wegener's granulomatosis without renal involvement: Secondary | ICD-10-CM

## 2019-05-26 LAB — CMP (CANCER CENTER ONLY)
ALT: 19 U/L (ref 0–44)
AST: 19 U/L (ref 15–41)
Albumin: 3.9 g/dL (ref 3.5–5.0)
Alkaline Phosphatase: 57 U/L (ref 38–126)
Anion gap: 6 (ref 5–15)
BUN: 18 mg/dL (ref 6–20)
CO2: 28 mmol/L (ref 22–32)
Calcium: 9.7 mg/dL (ref 8.9–10.3)
Chloride: 106 mmol/L (ref 98–111)
Creatinine: 0.82 mg/dL (ref 0.44–1.00)
GFR, Est AFR Am: >60 mL/min (ref >60)
GFR, Estimated: >60 mL/min (ref >60)
Glucose, Bld: 90 mg/dL (ref 70–99)
Potassium: 4.2 mmol/L (ref 3.5–5.1)
Sodium: 140 mmol/L (ref 135–145)
Total Bilirubin: 0.4 mg/dL (ref 0.3–1.2)
Total Protein: 7.1 g/dL (ref 6.5–8.1)

## 2019-05-26 LAB — CBC WITH DIFFERENTIAL (CANCER CENTER ONLY)
Abs Immature Granulocytes: 0.02 10*3/uL (ref 0.00–0.07)
Basophils Absolute: 0.1 10*3/uL (ref 0.0–0.1)
Basophils Relative: 1 %
Eosinophils Absolute: 0.1 10*3/uL (ref 0.0–0.5)
Eosinophils Relative: 2 %
HCT: 41.7 % (ref 36.0–46.0)
Hemoglobin: 13.6 g/dL (ref 12.0–15.0)
Immature Granulocytes: 0 %
Lymphocytes Relative: 29 %
Lymphs Abs: 2 10*3/uL (ref 0.7–4.0)
MCH: 29.7 pg (ref 26.0–34.0)
MCHC: 32.6 g/dL (ref 30.0–36.0)
MCV: 91 fL (ref 80.0–100.0)
Monocytes Absolute: 0.6 10*3/uL (ref 0.1–1.0)
Monocytes Relative: 9 %
Neutro Abs: 4 10*3/uL (ref 1.7–7.7)
Neutrophils Relative %: 59 %
Platelet Count: 332 10*3/uL (ref 150–400)
RBC: 4.58 MIL/uL (ref 3.87–5.11)
RDW: 13.1 % (ref 11.5–15.5)
WBC Count: 6.7 10*3/uL (ref 4.0–10.5)
nRBC: 0 % (ref 0.0–0.2)

## 2019-05-26 LAB — URINALYSIS, COMPLETE (UACMP) WITH MICROSCOPIC
Bilirubin Urine: NEGATIVE
Glucose, UA: NEGATIVE mg/dL
Hgb urine dipstick: NEGATIVE
Ketones, ur: NEGATIVE mg/dL
Leukocytes,Ua: NEGATIVE
Nitrite: NEGATIVE
Protein, ur: NEGATIVE mg/dL
Specific Gravity, Urine: 1.005 (ref 1.005–1.030)
pH: 7 (ref 5.0–8.0)

## 2019-05-26 LAB — C-REACTIVE PROTEIN: CRP: <0.8 mg/dL (ref <1.0)

## 2019-05-26 LAB — PHOSPHORUS: Phosphorus: 4.2 mg/dL (ref 2.5–4.6)

## 2019-05-26 LAB — URIC ACID: Uric Acid, Serum: 3.6 mg/dL (ref 2.5–7.1)

## 2019-05-26 LAB — SEDIMENTATION RATE: Sed Rate: 12 mm/hr (ref 0–22)

## 2019-05-27 ENCOUNTER — Telehealth: Payer: Self-pay | Admitting: Internal Medicine

## 2019-05-27 NOTE — Telephone Encounter (Signed)
Pt requesting Dr. Hilarie Fredrickson call her, she would like to discuss having EGD done. Tentatively scheduled for 10/26 and placed on waitlist.

## 2019-05-28 NOTE — Telephone Encounter (Signed)
I tried calling the patient, reached her voicemail and I left her a message Will try her again later

## 2019-06-02 LAB — GASTROINTESTINAL PATHOGEN PANEL PCR
C. difficile Tox A/B, PCR: NOT DETECTED
Campylobacter, PCR: NOT DETECTED
Cryptosporidium, PCR: NOT DETECTED
E coli (ETEC) LT/ST PCR: NOT DETECTED
E coli (STEC) stx1/stx2, PCR: NOT DETECTED
E coli 0157, PCR: NOT DETECTED
Giardia lamblia, PCR: NOT DETECTED
Norovirus, PCR: NOT DETECTED
Rotavirus A, PCR: NOT DETECTED
Salmonella, PCR: NOT DETECTED
Shigella, PCR: NOT DETECTED

## 2019-06-02 LAB — OVA AND PARASITE EXAMINATION
CONCENTRATE RESULT:: NONE SEEN
MICRO NUMBER:: 903578
SPECIMEN QUALITY:: ADEQUATE
TRICHROME RESULT:: NONE SEEN

## 2019-06-02 NOTE — Telephone Encounter (Signed)
Spoke to patient by phone, 26 minutes She continues to have issues with reflux particularly worse at night.  She is back on Dexilant 60 mg daily.  This definitely helps.  Pepcid does help a lot when she uses that in the evening. 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  She has had a flare recently in her sinuses but not currently any tracheal involvement.  She will be going to [facility] in late October. We had previously discussed upper endoscopy to evaluate her reflux and this is also been recommended by her physicians at [facility]. She had questions regarding the upper endoscopy.  We spent time today discussing upper endoscopy in detail including the risk, benefits and alternatives. 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  I offered upper endoscopy on 06/05/2019 at 10:30 AM or on 06/12/2019 at 3 PM.  She will discuss with care partner and see which works better for her from a transportation perspective.  After discussing the risks, benefits and alternatives she is agreeable and wishes to proceed.  Please prescribe Dexilant 60 mg daily.  Also let her know she could use famotidine 20 mg at bedtime as needed I will also send a note to Osvaldo Angst, CRNA for chart review to ensure she qualifies for anesthesia in the [facility].  John, please see her ENT history through care everywhere from [facility]  Thanks to all [facility]

## 2019-06-03 ENCOUNTER — Encounter: Payer: Self-pay | Admitting: Internal Medicine

## 2019-06-03 ENCOUNTER — Other Ambulatory Visit: Payer: Self-pay

## 2019-06-03 ENCOUNTER — Telehealth: Payer: Self-pay | Admitting: Internal Medicine

## 2019-06-03 DIAGNOSIS — K219 Gastro-esophageal reflux disease without esophagitis: Secondary | ICD-10-CM

## 2019-06-03 MED ORDER — DEXLANSOPRAZOLE 60 MG PO CPDR: 60.0000 mg | DELAYED_RELEASE_CAPSULE | Freq: Every day | ORAL | 3 refills | Status: DC

## 2019-06-03 NOTE — Telephone Encounter (Signed)
Theresa [name] called and EGD scheduled in the [place] for 06/05/19@10 :30am. Theresa [name] knows to arrive at 9:30am. Theresa [name] wants to know if Dr. Hilarie Fredrickson has heard anything from CRNA. Please advise.

## 2019-06-03 NOTE — Telephone Encounter (Signed)
Script sent to pharmacy and pt aware. 

## 2019-06-03 NOTE — Telephone Encounter (Signed)
Dr.  Pyrtle,  This pt is cleared for anesthetic care at [facility].  Thanks,  Kenyada Hy 

## 2019-06-03 NOTE — Telephone Encounter (Signed)
Called LILYANN GOETZ OBrien 1:24 PM 06/03/2019 - wanted  To update me  Said she did virtual visit with [facility]. Labs look worse. Dr Leanne Lovely wants to see her on site at [facility] in [place] and this will be in late October. Also going to see ENT. Has been having epistaxis. Otherwise well. Says Dr Hilarie Fredrickson planning UGI endoscopy in 2 days 06/05/2019 to rule out Barretts. (neg covid June 2020). Dr Hilarie Fredrickson decideing between 4th [facility] and waiting to hear from anesthesia. 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 Brand Males, M.D., F.C.C.P,  Pulmonary and Critical Care Medicine Staff Physician, [place] Director - Interstitial Lung Disease  Program  Pulmonary [place] at [place], [place], [address]  Pager: [phone], If no answer or between  15:00h - 7:00h: call [phone] Telephone: [phone]  1:30 PM 06/03/2019

## 2019-06-03 NOTE — Telephone Encounter (Signed)
Not yet Jenny Reichmann, please review Thanks JMP

## 2019-06-04 ENCOUNTER — Telehealth: Payer: Self-pay | Admitting: Internal Medicine

## 2019-06-04 NOTE — Telephone Encounter (Signed)

## 2019-06-04 NOTE — Telephone Encounter (Signed)
Note below is an error.

## 2019-06-04 NOTE — Telephone Encounter (Signed)
Pt called about having and EGD tomorrow, she wants to know if she will be having procedure tomorrow or not, she is getting a big concerned, pls call her.

## 2019-06-04 NOTE — Telephone Encounter (Signed)
Pt is scheduled to see Dr. Damita Lack 07/28/19@8 :30am.

## 2019-06-04 NOTE — Telephone Encounter (Signed)
Pt aware, see pt advice request.

## 2019-06-05 ENCOUNTER — Other Ambulatory Visit: Payer: Self-pay

## 2019-06-05 ENCOUNTER — Encounter: Payer: Self-pay | Admitting: Internal Medicine

## 2019-06-05 ENCOUNTER — Ambulatory Visit (AMBULATORY_SURGERY_CENTER): Payer: Medicare Other | Admitting: Internal Medicine

## 2019-06-05 VITALS — BP 105/75 | HR 61 | Temp 98.6°F | Resp 13 | Ht 66.0 in | Wt 157.0 lb

## 2019-06-05 DIAGNOSIS — E079 Disorder of thyroid, unspecified: Secondary | ICD-10-CM

## 2019-06-05 DIAGNOSIS — K219 Gastro-esophageal reflux disease without esophagitis: Secondary | ICD-10-CM | POA: Diagnosis not present

## 2019-06-05 MED ORDER — SODIUM CHLORIDE 0.9 % IV SOLN: 500.0000 mL | Freq: Once | INTRAVENOUS | Status: DC

## 2019-06-05 NOTE — Progress Notes (Signed)
Report to PACU, RN, vss, BBS= Clear.  

## 2019-06-05 NOTE — Progress Notes (Signed)
Temperature taken by K.A., VS taken by C.W. 

## 2019-06-05 NOTE — Op Note (Signed)
[facility] Patient Name: Theresa [name] Procedure Date: 06/05/2019 10:30 AM MRN: [id number] Endoscopist: Jerene Bears , MD Age: 58 Referring MD:  Date of Birth: [birth year] Gender: Female Account #: [id number] Procedure:                Upper GI endoscopy Indications:              Gastro-esophageal reflux disease Medicines:                Monitored Anesthesia Care Procedure:                Pre-Anesthesia Assessment:                           - Prior to the procedure, a History and Physical                            was performed, and patient medications and                            allergies were reviewed. The patient's tolerance of                            previous anesthesia was also reviewed. The risks                            and benefits of the procedure and the sedation                            options and risks were discussed with the patient.                            All questions were answered, and informed consent                            was obtained. Prior Anticoagulants: The patient has                            taken no previous anticoagulant or antiplatelet                            agents. ASA Grade Assessment: II - A patient with                            mild systemic disease. After reviewing the risks                            and benefits, the patient was deemed in                            satisfactory condition to undergo the procedure.                           After obtaining informed consent, the endoscope was  passed under direct vision. Throughout the                            procedure, the patient's blood pressure, pulse, and                            oxygen saturations were monitored continuously. The                            Endoscope was introduced through the mouth, and                            advanced to the second part of duodenum. The upper                            GI endoscopy was  accomplished without difficulty.                            The patient tolerated the procedure well. Scope In: Scope Out: Findings:                 Normal mucosa was found in the entire esophagus.                            The GE junction was somewhat patulous (which likely                            explains acid reflux). No evidence of hiatal hernia.                           The entire examined stomach was normal.                           The examined duodenum was normal. Complications:            No immediate complications. Estimated Blood Loss:     Estimated blood loss: none. Impression:               - Normal mucosa was found in the entire esophagus.                           - Normal stomach.                           - Normal examined duodenum.                           - No specimens collected. Recommendation:           - Patient has a contact number available for                            emergencies. The signs and symptoms of potential                            delayed complications were discussed with the  patient. Return to normal activities tomorrow.                            Written discharge instructions were provided to the                            patient.                           - Resume previous diet.                           - Continue present medications. Jerene Bears, MD 06/05/2019 10:57:06 AM This report has been signed electronically.

## 2019-06-05 NOTE — Patient Instructions (Signed)
Discharge instructions given. Normal exam. Resume previous medications. YOU HAD AN ENDOSCOPIC PROCEDURE TODAY AT THE [facility]:   Refer to the procedure report that was given to you for any specific questions about what was found during the examination.  If the procedure report does not answer your questions, please call your gastroenterologist to clarify.  If you requested that your care partner not be given the details of your procedure findings, then the procedure report has been included in a sealed envelope for you to review at your convenience later.  YOU SHOULD EXPECT: Some feelings of bloating in the abdomen. Passage of more gas than usual.  Walking can help get rid of the air that was put into your GI tract during the procedure and reduce the bloating. If you had a lower endoscopy (such as a colonoscopy or flexible sigmoidoscopy) you may notice spotting of blood in your stool or on the toilet paper. If you underwent a bowel prep for your procedure, you may not have a normal bowel movement for a few days.  Please Note:  You might notice some irritation and congestion in your nose or some drainage.  This is from the oxygen used during your procedure.  There is no need for concern and it should clear up in a day or so.  SYMPTOMS TO REPORT IMMEDIATELY:   Following lower endoscopy (colonoscopy or flexible sigmoidoscopy):  Excessive amounts of blood in the stool  Significant tenderness or worsening of abdominal pains  Swelling of the abdomen that is new, acute  Fever of 100F or higher   For urgent or emergent issues, a gastroenterologist can be reached at any hour by calling [phone].   DIET:  We do recommend a small meal at first, but then you may proceed to your regular diet.  Drink plenty of fluids but you should avoid alcoholic beverages for 24 hours.  ACTIVITY:  You should plan to take it easy for the rest of today and you should NOT DRIVE or use heavy machinery  until tomorrow (because of the sedation medicines used during the test).    FOLLOW UP: Our staff will call the number listed on your records 48-72 hours following your procedure to check on you and address any questions or concerns that you may have regarding the information given to you following your procedure. If we do not reach you, we will leave a message.  We will attempt to reach you two times.  During this call, we will ask if you have developed any symptoms of COVID 19. If you develop any symptoms (ie: fever, flu-like symptoms, shortness of breath, cough etc.) before then, please call [phone].  If you test positive for Covid 19 in the 2 weeks post procedure, please call and report this information to us.    If any biopsies were taken you will be contacted by phone or by letter within the next 1-3 weeks.  Please call us at [phone] if you have not heard about the biopsies in 3 weeks.    SIGNATURES/CONFIDENTIALITY: You and/or your care partner have signed paperwork which will be entered into your electronic medical record.  These signatures attest to the fact that that the information above on your After Visit Summary has been reviewed and is understood.  Full responsibility of the confidentiality of this discharge information lies with you and/or your care-partner. 

## 2019-06-09 ENCOUNTER — Telehealth: Payer: Self-pay

## 2019-06-09 NOTE — Telephone Encounter (Signed)
NO ANSWER, MESSAGE LEFT FOR PATIENT. 

## 2019-06-09 NOTE — Telephone Encounter (Signed)
  Follow up Call-  Call back number 06/05/2019  Post procedure Call Back phone  # [phone]  Permission to leave phone message Yes  Some recent data might be hidden     Patient questions:  Do you have a fever, pain , or abdominal swelling? No. Pain Score  0 *  Have you tolerated food without any problems? Yes.    Have you been able to return to your normal activities? Yes.    Do you have any questions about your discharge instructions: Diet   No. Medications  No. Follow up visit  No.  Do you have questions or concerns about your Care? No.  Actions: * If pain score is 4 or above: No action needed, pain <4. 1. Have you developed a fever since your procedure? no  2.   Have you had an respiratory symptoms (SOB or cough) since your procedure? no  3.   Have you tested positive for COVID 19 since your procedure no  4.   Have you had any family members/close contacts diagnosed with the COVID 19 since your procedure?  no   If yes to any of these questions please route to Joylene John, RN and Alphonsa Gin, Therapist, sports.

## 2019-06-12 NOTE — Telephone Encounter (Signed)
Much apprecite the feedback. Glad thjings went well. Best wishes for the consult and outcome at [facility]. We are here with you.

## 2019-06-12 NOTE — Telephone Encounter (Signed)
Dr. Chase Caller, please see pt's email. Just FYI.

## 2019-06-30 ENCOUNTER — Encounter: Payer: Medicare Other | Admitting: Internal Medicine

## 2019-07-05 ENCOUNTER — Encounter: Payer: Self-pay | Admitting: Oncology

## 2019-07-07 ENCOUNTER — Encounter: Payer: Self-pay | Admitting: Oncology

## 2019-07-07 ENCOUNTER — Telehealth: Payer: Self-pay | Admitting: *Deleted

## 2019-07-07 MED ORDER — DEXLANSOPRAZOLE 60 MG PO CPDR: 60.0000 mg | DELAYED_RELEASE_CAPSULE | Freq: Two times a day (BID) | ORAL | 3 refills | Status: AC

## 2019-07-07 NOTE — Telephone Encounter (Signed)
We have received and filled out our portion of [employer] patient assistance forms for Dexilant. Patient must fill out her portion and can come pick up her papers to fill out at her convenience.

## 2019-07-08 ENCOUNTER — Encounter: Payer: Self-pay | Admitting: Oncology

## 2019-07-09 ENCOUNTER — Other Ambulatory Visit: Payer: Self-pay | Admitting: Oncology

## 2019-07-10 ENCOUNTER — Encounter: Payer: Self-pay | Admitting: *Deleted

## 2019-07-15 ENCOUNTER — Encounter: Payer: Self-pay | Admitting: Oncology

## 2019-07-15 ENCOUNTER — Telehealth: Payer: Self-pay | Admitting: Oncology

## 2019-07-15 NOTE — Telephone Encounter (Signed)
Scheduled appt per 11/4 sch message - pt is aware of appt but needs to discuss appts with RN . Message sent to RN to let her know pt needs a call.

## 2019-07-16 ENCOUNTER — Encounter: Payer: Self-pay | Admitting: Oncology

## 2019-07-16 NOTE — Progress Notes (Signed)
ID: Theresa [name]  DOB: 1961/08/09  MR#: [id number]  CSN#: [id number]  Patient Care Team: Antony Contras, MD as PCP - General (Family Medicine) Brand Males, MD as Consulting Physician (Pulmonary Disease) Delrae Rend, MD as Consulting Physician (Endocrinology) Magrinat, Virgie Dad, MD as Consulting Physician (Oncology) Ernestine Conrad, MD as Referring Physician (Otolaryngology) Clinger, Chrystie Nose, MD (Otolaryngology) Pedro Earls, MD as Attending Physician (Family Medicine) Don Broach, MD as Referring Physician (Internal Medicine) Pyrtle, Lajuan Lines, MD as Consulting Physician (Gastroenterology) Lanice Schwab, DO as Referring Physician (Rheumatology) OTHER MD: [name] (fax [phone]); Jaquita Rector; Danton Sewer; Kandis Ban; Huey Romans  Interval History:   Theresa [name] returns today for 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  She continues to be followed through the [facility] under Dr [name].  She also sees Dr. Chase Caller here as a pulmonologist and Dr. Elmo Putt as her GI doctor  Dr. Mariane Baumgarten started her on Bactrim daily beginning 07/07/2019 for pneumocystis prevention, and prednisone on a taper, currently at 20 mg daily.  He also suggested rituximab 1000 mg single infusion, with quarterly B-cell and ANCA monitoring by mail kit beginning 3 months after her rituximab dose, and to return to [facility] in 6 months for complete reevaluation including ENT (that will be on May 2021).  However he and Theresa [name] are concerned that if she receives rituximab she may have a very prolonged B-cell deficit and may not be able to mount an antibody response to the pending coronavirus vaccine.  Accordingly they are considering going on CellCept for several months until least can get through that hump.  Meanwhile she continues on Prolia with good tolerance.  Her most recent dose was 10/21/2018.  She is due for treatment today.  Since her last visit, she underwent bilateral screening  mammography with tomography at [facility] on 02/07/2019 showing: breast density category B; no evidence of malignancy in either breast.  She also underwent chest x-ray on 03/21/2019 for recent fatigue, which showed: no acute abnormality; no pulmonary changes of Wegener's granulomatosis.  She also underwent surveillance thyroid ultrasound on 05/22/2019 for follow up of thyroid nodules. This showed: similar findings of multinodular goiter; no definitive worrisome new or enlarging thyroid nodules.   ROS:  Theresa [name] is generally at baseline.  Sometimes she has a strange feeling like she might choke, but is very functional, and is not disabled by cough, wheezing, pleurisy, and has had no intercurrent fevers rash or other unusual symptoms.  She is exercising regularly.  A detailed review of systems today was otherwise stable.   History of wegener's granulomatosis:  Theresa [name] tells me her diagnosis of Wegener was initially made at [facility] by Dr. [name] in 1999.  She has been treated in the past with Cytoxan and prednisone, methotrexate very briefly, Cytoxan orally, then Imuran for some time, and then off treatment for some years.  She was started on prednisone alone in 2009, and then in 06/2008 the azathioprine was added again at 150 mg daily, and variable doses of prednisone.  It is felt that she failed remission maintenance therapy since she could not reduce her dose of prednisone below 20 mg daily without having a resurgence of symptoms, and therefore she has been started on Rituxan with the first dose given at the [facility] on 06/24.  The patient tolerated the treatment quite well.  Her subsequent history is as detailed below  PMHx: Associated with the Wegener, the past medical history is significant for prior tracheostomy, prior sinus  surgery x 2, prior right tear duct surgery at [facility] for epiphora (with subsequent re-obstruction).  She also has a history of nasal saddling and inflammation left  greater than right.  She also gets occasional thrush problems because of the steroid treatment.  Aside from the Wegener, she has a history of osteoporosis, history of migraines, history of GERD, history of mitral valve prolapse, and a history of MRSA infection remotely (1999).    Allergies  Allergen Reactions  . Codeine Itching  . Eletriptan Nausea Only  . Vancomycin Hives  . Tape Rash    Current Outpatient Medications  Medication Sig Dispense Refill  . albuterol (PROVENTIL HFA;VENTOLIN HFA) 108 (90 BASE) MCG/ACT inhaler Inhale 2 puffs into the lungs every 6 (six) hours as needed. For shortness of breath or wheezing    . denosumab (PROLIA) 60 MG/ML SOSY injection Inject 60 mg into the skin every 6 (six) months.    . dexlansoprazole (DEXILANT) 60 MG capsule Take 1 capsule (60 mg total) by mouth 2 (two) times daily. 180 capsule 3  . fluticasone (FLONASE) 50 MCG/ACT nasal spray Place 2 sprays into the nose daily.    . fluticasone (FLOVENT HFA) 220 MCG/ACT inhaler Inhale 2 puffs into the lungs 2 (two) times daily. 1 Inhaler 12  . Hypertonic Nasal Wash (SINUS RINSE BOTTLE KIT NA) Place into the nose.    . Lactobacillus Rhamnosus, GG, (CULTURELLE PO) Take 1 capsule by mouth 2 (two) times a day.    Marland Kitchen LORazepam (ATIVAN) 1 MG tablet Take 1 tablet (1 mg total) by mouth 2 (two) times daily as needed for anxiety. (Patient taking differently: Take 2-3 mg by mouth at bedtime. ) 60 tablet 1  . Multiple Vitamin (MULTIVITAMIN) tablet Take 1 tablet by mouth daily.    . Multiple Vitamins-Minerals (PRESERVISION AREDS 2 PO) Take 1 tablet by mouth daily.     Marland Kitchen omeprazole (PRILOSEC) 40 MG capsule Take 1 capsule (40 mg total) by mouth daily. (Patient not taking: Reported on 06/05/2019) 30 capsule 2  . zolpidem (AMBIEN) 10 MG tablet Take one tablet hs prn.  Do not take with Ativan (Patient not taking: Reported on 12/23/2018) 30 tablet 1   No current facility-administered medications for this visit.     FAMILY  HISTORY:  The patient's father died in 09-30-2018.  The patient's mother is alive in her late 3s.  She was diagnosed with what appears to be early stage breast cancer and 2020 the patient has 3 sisters.  There is no one else in the family with autoimmune disease or with cancer.  GYNECOLOGIC HISTORY:  She is GX, P2, first pregnancy to term age 58.  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  She is currently on furlough.  Her former husband, Hilliard Clark, works for the [employer] for [employer], where he is the [employer].  He is living in [place] and has remarried.  The patient's son [name], age 68, is currently in [place] trying to find a job. Son [name], age 19, is currently a senior at [employer], and he is in transition (female to female).  The patient attends [employer] here in [place].   Objective: Middle-aged white woman in no acute distress  Vitals:   07/17/19 0828  BP: 115/72  Pulse: 67  Resp: 18  Temp: 98.3 F (36.8 C)  SpO2: 99%    BMI: Body mass index is 25.87 kg/m.   ECOG FS: 1  Sclerae unicteric, EOMs intact Wearing a mask No cervical or supraclavicular adenopathy Lungs no rales or rhonchi Heart regular rate and rhythm Abd soft, nontender, positive bowel sounds MSK no focal spinal tenderness, no upper extremity lymphedema Neuro: nonfocal, well oriented, appropriate affect Breasts: Deferred   Lab Results:    Chemistry      Component Value Date/Time   NA 140 05/26/2019 1030   NA 140 08/06/2017 1335   K 4.2 05/26/2019 1030   K 4.7 08/06/2017 1335   CL 106 05/26/2019 1030   CL 106 11/07/2012 0916   CO2 28 05/26/2019 1030   CO2 26 08/06/2017 1335   BUN 18 05/26/2019 1030   BUN 17.3 08/06/2017 1335   CREATININE 0.82 05/26/2019 1030   CREATININE 0.9  08/06/2017 1335      Component Value Date/Time   CALCIUM 9.7 05/26/2019 1030   CALCIUM 9.9 08/06/2017 1335   ALKPHOS 57 05/26/2019 1030   ALKPHOS 50 08/06/2017 1335   AST 19 05/26/2019 1030   AST 18 08/06/2017 1335   ALT 19 05/26/2019 1030   ALT 20 08/06/2017 1335   BILITOT 0.4 05/26/2019 1030   BILITOT 0.40 08/06/2017 1335       Lab Results  Component Value Date   WBC 10.1 07/17/2019   HGB 13.0 07/17/2019   HCT 40.3 07/17/2019   MCV 90.6 07/17/2019   PLT 353 07/17/2019   NEUTROABS 6.2 07/17/2019    Studies/Results: No results found.   Assessment: 58 y.o.  Theresa [name] woman with a history of Wegener's granulomatosis initially diagnosed in 1999, variously treated as noted above, s/p Rituxan x4 completed July 2010, currently not on prednisone maintenance.  (1) osteoporosis, treated on alendronate between 2002 and 2005, ibandronate between 2005 in 2008, then risedronate between 2008 and 2011, first dose of Reclast/zolendronate 10/24/2013  (a) DEXA scan 12/22/2012 lowest reading -2.8 at the spine, -2.1 of the femoral neck  (b) repeat bone density 2017 show worsening T score-- zolendronate discontinued 01/18/2015  (c) denosumab/Prolia started 05/02/2016  Plan: Theresa [name] is having some early signs of progression of her Wegener's and her treatment is being intensified.  This is under the direction of Dr. Mariane Baumgarten at [facility].  She is currently on Bactrim and prednisone and is considering CellCept.  We had planned to give her rituximab 1g as per Dr. Claire Shown original plan but that has been changed.  The orders are in place and we can certainly operationalize that at any time if plans are changed.  She will have her mammogram at [facility] in May.  She will have a bone density at that time as well.  She will see me after that.  Of course I will be glad to see her before that if we can be of any assistance to her.  Magrinat, Virgie Dad, MD  07/17/19 8:46 AM Medical Oncology and Hematology [facility] [place], [place] [address] Tel. [phone]    Fax. [phone]    I, Wilburn Mylar, am acting as scribe for Dr. Virgie Dad. Magrinat.  I, Lurline Del MD, have reviewed the above documentation for accuracy and completeness, and I agree with the above.

## 2019-07-17 ENCOUNTER — Inpatient Hospital Stay: Payer: Medicare Other

## 2019-07-17 ENCOUNTER — Inpatient Hospital Stay: Payer: Medicare Other | Attending: Oncology

## 2019-07-17 ENCOUNTER — Other Ambulatory Visit: Payer: Self-pay

## 2019-07-17 ENCOUNTER — Inpatient Hospital Stay: Payer: Medicare Other | Admitting: Oncology

## 2019-07-17 VITALS — BP 115/72 | HR 67 | Temp 98.3°F | Resp 18 | Ht 66.0 in | Wt 160.3 lb

## 2019-07-17 DIAGNOSIS — M3130 Wegener's granulomatosis without renal involvement: Secondary | ICD-10-CM | POA: Diagnosis not present

## 2019-07-17 DIAGNOSIS — M818 Other osteoporosis without current pathological fracture: Secondary | ICD-10-CM | POA: Diagnosis not present

## 2019-07-17 DIAGNOSIS — M810 Age-related osteoporosis without current pathological fracture: Secondary | ICD-10-CM | POA: Diagnosis present

## 2019-07-17 LAB — CBC WITH DIFFERENTIAL/PLATELET
Abs Immature Granulocytes: 0.08 10*3/uL — ABNORMAL HIGH (ref 0.00–0.07)
Basophils Absolute: 0.1 10*3/uL (ref 0.0–0.1)
Basophils Relative: 1 %
Eosinophils Absolute: 0.1 10*3/uL (ref 0.0–0.5)
Eosinophils Relative: 1 %
HCT: 40.3 % (ref 36.0–46.0)
Hemoglobin: 13 g/dL (ref 12.0–15.0)
Immature Granulocytes: 1 %
Lymphocytes Relative: 28 %
Lymphs Abs: 2.8 10*3/uL (ref 0.7–4.0)
MCH: 29.2 pg (ref 26.0–34.0)
MCHC: 32.3 g/dL (ref 30.0–36.0)
MCV: 90.6 fL (ref 80.0–100.0)
Monocytes Absolute: 0.8 10*3/uL (ref 0.1–1.0)
Monocytes Relative: 8 %
Neutro Abs: 6.2 10*3/uL (ref 1.7–7.7)
Neutrophils Relative %: 61 %
Platelets: 353 10*3/uL (ref 150–400)
RBC: 4.45 MIL/uL (ref 3.87–5.11)
RDW: 13.2 % (ref 11.5–15.5)
WBC: 10.1 10*3/uL (ref 4.0–10.5)
nRBC: 0 % (ref 0.0–0.2)

## 2019-07-17 LAB — COMPREHENSIVE METABOLIC PANEL
ALT: 22 U/L (ref 0–44)
AST: 15 U/L (ref 15–41)
Albumin: 3.9 g/dL (ref 3.5–5.0)
Alkaline Phosphatase: 58 U/L (ref 38–126)
Anion gap: 9 (ref 5–15)
BUN: 19 mg/dL (ref 6–20)
CO2: 27 mmol/L (ref 22–32)
Calcium: 9.5 mg/dL (ref 8.9–10.3)
Chloride: 103 mmol/L (ref 98–111)
Creatinine, Ser: 0.94 mg/dL (ref 0.44–1.00)
GFR calc Af Amer: >60 mL/min (ref >60)
GFR calc non Af Amer: >60 mL/min (ref >60)
Glucose, Bld: 86 mg/dL (ref 70–99)
Potassium: 4.4 mmol/L (ref 3.5–5.1)
Sodium: 139 mmol/L (ref 135–145)
Total Bilirubin: 0.4 mg/dL (ref 0.3–1.2)
Total Protein: 7.1 g/dL (ref 6.5–8.1)

## 2019-07-17 MED ORDER — DENOSUMAB 60 MG/ML ~~LOC~~ SOSY
PREFILLED_SYRINGE | SUBCUTANEOUS | Status: AC
  Filled 2019-07-17: qty 1

## 2019-07-17 MED ORDER — DENOSUMAB 60 MG/ML ~~LOC~~ SOSY
60.0000 mg | PREFILLED_SYRINGE | Freq: Once | SUBCUTANEOUS | Status: AC
  Administered 2019-07-17: 60 mg via SUBCUTANEOUS

## 2019-07-17 NOTE — Progress Notes (Signed)
Contacted Dr. [name] to clarify that the patient will not be receiving Rituxan today but will receive Prolia injection per pt. Dr, [name] confirmed the above plan.

## 2019-07-18 LAB — THYROID PANEL WITH TSH
Free Thyroxine Index: 1.9 (ref 1.2–4.9)
T3 Uptake Ratio: 28 % (ref 24–39)
T4, Total: 6.8 ug/dL (ref 4.5–12.0)
TSH: 2.63 u[IU]/mL (ref 0.450–4.500)

## 2019-07-23 ENCOUNTER — Other Ambulatory Visit: Payer: Self-pay

## 2019-08-12 ENCOUNTER — Telehealth: Payer: Self-pay | Admitting: *Deleted

## 2019-08-12 ENCOUNTER — Encounter: Payer: Self-pay | Admitting: Oncology

## 2019-08-12 NOTE — Telephone Encounter (Signed)
Copied from My Chart portal:  [name] sent to [place] [facility]  Phone Number: [phone]        Hi Dr. [name],   We have decided to go with CellCept for the Wegener's flare occurring with monitoring. It may give me an opportunity to get a vaccine if available instead of using the Rituximab initially in which case the vaccine would be ineffective. Would you mind providing the contact information for the [facility] to send lab orders to the [facility] ( name, address, phone, fax, contact individual) - as always, many thanks for your support and assistance.   I began the plan below yesterday.   My vasculitis plan of care is:  Begins Monday, August 11, 2019   -  Prednisone:  20 mg daily until CellCept target dose (1000 mg twice daily) achieved then, (11/2 -12/27)  15 mg daily x 2 weeks then, (12/28-1/10)  10 mg daily x 2 weeks then provide symptom update (1/11-1/25)   -  CellCept:  500 mg twice day x 7 days (12/7-12/13)  1000 mg mornings & 500 mg evenings x 7 days (12/14-12/20)  1000 mg twice daily (target dose) thereafter (add additional labs if needed to check for therapeutic level due to concomitant antacid (Dexilant) use. (12/21-...)   -  Medication lab [facility] [name])  weekly until target dose achieved then  every 2 weeks for a month then  monthly thereafter   -  Bactrim SS daily for PJP prophylaxis ( 11/2-...)   -  Reconsider rituximab treatment following successful COVID-19 vaccination   -  6 month return to [facility] for a complete re-evaluation (including ENT) due May 2021   Airel Schirripa  [phone]    This RN also received call from the [facility] clinic stating they wanted to verify fax number to send lab orders.  This RN gave direct fax number to this nursing pod.  Appointment request sent per above request.

## 2019-08-13 ENCOUNTER — Telehealth: Payer: Self-pay | Admitting: Oncology

## 2019-08-13 NOTE — Telephone Encounter (Signed)
Scheduled appt per 12/8 sch message - pt aware of appt date and times

## 2019-08-18 ENCOUNTER — Inpatient Hospital Stay: Payer: Medicare Other | Attending: Oncology

## 2019-08-18 ENCOUNTER — Other Ambulatory Visit: Payer: Self-pay

## 2019-08-18 DIAGNOSIS — M810 Age-related osteoporosis without current pathological fracture: Secondary | ICD-10-CM | POA: Insufficient documentation

## 2019-08-18 DIAGNOSIS — M3130 Wegener's granulomatosis without renal involvement: Secondary | ICD-10-CM | POA: Diagnosis present

## 2019-08-18 LAB — COMPREHENSIVE METABOLIC PANEL
ALT: 19 U/L (ref 0–44)
AST: 15 U/L (ref 15–41)
Albumin: 3.8 g/dL (ref 3.5–5.0)
Alkaline Phosphatase: 45 U/L (ref 38–126)
Anion gap: 10 (ref 5–15)
BUN: 16 mg/dL (ref 6–20)
CO2: 25 mmol/L (ref 22–32)
Calcium: 9.2 mg/dL (ref 8.9–10.3)
Chloride: 104 mmol/L (ref 98–111)
Creatinine, Ser: 0.98 mg/dL (ref 0.44–1.00)
GFR calc Af Amer: >60 mL/min (ref >60)
GFR calc non Af Amer: >60 mL/min (ref >60)
Glucose, Bld: 85 mg/dL (ref 70–99)
Potassium: 3.9 mmol/L (ref 3.5–5.1)
Sodium: 139 mmol/L (ref 135–145)
Total Bilirubin: 0.4 mg/dL (ref 0.3–1.2)
Total Protein: 6.7 g/dL (ref 6.5–8.1)

## 2019-08-18 LAB — CBC WITH DIFFERENTIAL/PLATELET
Abs Immature Granulocytes: 0.05 10*3/uL (ref 0.00–0.07)
Basophils Absolute: 0.1 10*3/uL (ref 0.0–0.1)
Basophils Relative: 1 %
Eosinophils Absolute: 0.1 10*3/uL (ref 0.0–0.5)
Eosinophils Relative: 1 %
HCT: 41.7 % (ref 36.0–46.0)
Hemoglobin: 13.3 g/dL (ref 12.0–15.0)
Immature Granulocytes: 1 %
Lymphocytes Relative: 44 %
Lymphs Abs: 4.1 10*3/uL — ABNORMAL HIGH (ref 0.7–4.0)
MCH: 29.6 pg (ref 26.0–34.0)
MCHC: 31.9 g/dL (ref 30.0–36.0)
MCV: 92.9 fL (ref 80.0–100.0)
Monocytes Absolute: 0.8 10*3/uL (ref 0.1–1.0)
Monocytes Relative: 9 %
Neutro Abs: 4.2 10*3/uL (ref 1.7–7.7)
Neutrophils Relative %: 44 %
Platelets: 327 10*3/uL (ref 150–400)
RBC: 4.49 MIL/uL (ref 3.87–5.11)
RDW: 13.5 % (ref 11.5–15.5)
WBC: 9.4 10*3/uL (ref 4.0–10.5)
nRBC: 0 % (ref 0.0–0.2)

## 2019-08-25 ENCOUNTER — Other Ambulatory Visit: Payer: Self-pay

## 2019-08-25 ENCOUNTER — Inpatient Hospital Stay: Payer: Medicare Other

## 2019-08-25 DIAGNOSIS — M3130 Wegener's granulomatosis without renal involvement: Secondary | ICD-10-CM | POA: Diagnosis not present

## 2019-08-25 LAB — COMPREHENSIVE METABOLIC PANEL
ALT: 20 U/L (ref 0–44)
AST: 17 U/L (ref 15–41)
Albumin: 3.6 g/dL (ref 3.5–5.0)
Alkaline Phosphatase: 39 U/L (ref 38–126)
Anion gap: 9 (ref 5–15)
BUN: 14 mg/dL (ref 6–20)
CO2: 24 mmol/L (ref 22–32)
Calcium: 8.9 mg/dL (ref 8.9–10.3)
Chloride: 106 mmol/L (ref 98–111)
Creatinine, Ser: 0.93 mg/dL (ref 0.44–1.00)
GFR calc Af Amer: >60 mL/min (ref >60)
GFR calc non Af Amer: >60 mL/min (ref >60)
Glucose, Bld: 78 mg/dL (ref 70–99)
Potassium: 3.9 mmol/L (ref 3.5–5.1)
Sodium: 139 mmol/L (ref 135–145)
Total Bilirubin: 0.4 mg/dL (ref 0.3–1.2)
Total Protein: 6.5 g/dL (ref 6.5–8.1)

## 2019-08-25 LAB — CBC WITH DIFFERENTIAL/PLATELET
Abs Immature Granulocytes: 0.04 10*3/uL (ref 0.00–0.07)
Basophils Absolute: 0.1 10*3/uL (ref 0.0–0.1)
Basophils Relative: 1 %
Eosinophils Absolute: 0.1 10*3/uL (ref 0.0–0.5)
Eosinophils Relative: 1 %
HCT: 39.7 % (ref 36.0–46.0)
Hemoglobin: 12.9 g/dL (ref 12.0–15.0)
Immature Granulocytes: 0 %
Lymphocytes Relative: 38 %
Lymphs Abs: 4.2 10*3/uL — ABNORMAL HIGH (ref 0.7–4.0)
MCH: 30 pg (ref 26.0–34.0)
MCHC: 32.5 g/dL (ref 30.0–36.0)
MCV: 92.3 fL (ref 80.0–100.0)
Monocytes Absolute: 1 10*3/uL (ref 0.1–1.0)
Monocytes Relative: 9 %
Neutro Abs: 5.6 10*3/uL (ref 1.7–7.7)
Neutrophils Relative %: 51 %
Platelets: 338 10*3/uL (ref 150–400)
RBC: 4.3 MIL/uL (ref 3.87–5.11)
RDW: 13.6 % (ref 11.5–15.5)
WBC: 11 10*3/uL — ABNORMAL HIGH (ref 4.0–10.5)
nRBC: 0 % (ref 0.0–0.2)

## 2019-08-26 NOTE — Telephone Encounter (Signed)
Please let patient know that I expect her loose stools and lower abdominal cramping are related directly to CellCept. The bleeding may be due to inflamed internal hemorrhoids as a result of looser stools. Given that this is new I recommend she be seen for examination to identify bleeding source and if one is not readily available we may have to consider colonoscopy.  Please make her an appointment with an advanced practitioner for further evaluation of intermittent rectal bleeding

## 2019-08-27 ENCOUNTER — Ambulatory Visit: Payer: Medicare Other | Admitting: Physician Assistant

## 2019-09-01 ENCOUNTER — Inpatient Hospital Stay: Payer: Medicare Other

## 2019-09-01 ENCOUNTER — Other Ambulatory Visit: Payer: Self-pay

## 2019-09-01 DIAGNOSIS — M3130 Wegener's granulomatosis without renal involvement: Secondary | ICD-10-CM

## 2019-09-01 LAB — CBC WITH DIFFERENTIAL/PLATELET
Abs Immature Granulocytes: 0.07 10*3/uL (ref 0.00–0.07)
Basophils Absolute: 0.1 10*3/uL (ref 0.0–0.1)
Basophils Relative: 1 %
Eosinophils Absolute: 0.1 10*3/uL (ref 0.0–0.5)
Eosinophils Relative: 1 %
HCT: 41.2 % (ref 36.0–46.0)
Hemoglobin: 13 g/dL (ref 12.0–15.0)
Immature Granulocytes: 1 %
Lymphocytes Relative: 40 %
Lymphs Abs: 3.6 10*3/uL (ref 0.7–4.0)
MCH: 29.3 pg (ref 26.0–34.0)
MCHC: 31.6 g/dL (ref 30.0–36.0)
MCV: 92.8 fL (ref 80.0–100.0)
Monocytes Absolute: 0.9 10*3/uL (ref 0.1–1.0)
Monocytes Relative: 10 %
Neutro Abs: 4.2 10*3/uL (ref 1.7–7.7)
Neutrophils Relative %: 47 %
Platelets: 325 10*3/uL (ref 150–400)
RBC: 4.44 MIL/uL (ref 3.87–5.11)
RDW: 13.9 % (ref 11.5–15.5)
WBC: 9 10*3/uL (ref 4.0–10.5)
nRBC: 0 % (ref 0.0–0.2)

## 2019-09-01 LAB — COMPREHENSIVE METABOLIC PANEL
ALT: 23 U/L (ref 0–44)
AST: 19 U/L (ref 15–41)
Albumin: 3.7 g/dL (ref 3.5–5.0)
Alkaline Phosphatase: 45 U/L (ref 38–126)
Anion gap: 10 (ref 5–15)
BUN: 16 mg/dL (ref 6–20)
CO2: 25 mmol/L (ref 22–32)
Calcium: 9 mg/dL (ref 8.9–10.3)
Chloride: 104 mmol/L (ref 98–111)
Creatinine, Ser: 0.95 mg/dL (ref 0.44–1.00)
GFR calc Af Amer: >60 mL/min (ref >60)
GFR calc non Af Amer: >60 mL/min (ref >60)
Glucose, Bld: 61 mg/dL — ABNORMAL LOW (ref 70–99)
Potassium: 3.7 mmol/L (ref 3.5–5.1)
Sodium: 139 mmol/L (ref 135–145)
Total Bilirubin: 0.3 mg/dL (ref 0.3–1.2)
Total Protein: 6.5 g/dL (ref 6.5–8.1)

## 2019-09-08 ENCOUNTER — Other Ambulatory Visit: Payer: Medicare Other

## 2019-09-09 NOTE — Telephone Encounter (Signed)
Dr. Chase Caller,   This message was received this morning for you as FYI.Marland Kitchen  Hi Dr. Chase Caller,   Happy New Year! I hope it will be a healthy and nice one for you and your family and staff.   I switched my appointment to a telephone call because I am trying to stay more isolated and I've heard you've been busy at the [facility] hospital and truthfully that's much more important than me right now. I just wanted to update you on things. I won't take too much of your time.   I am now on Cellcept and hoping it will work in lieu of the rituximab.  I am worried about getting Covid because I am having a flare and I am frightened it will get into my airway again. I have no idea where [facility] stands on vaccinating immune compromised individuals but I hope it's soon.   Dr. [name] is handling my weekly labs since I've been on Cellcept.   Please stay safe and thank you!!   Theresa [name]

## 2019-09-10 ENCOUNTER — Inpatient Hospital Stay: Payer: Medicare Other | Attending: Oncology

## 2019-09-10 ENCOUNTER — Other Ambulatory Visit: Payer: Self-pay

## 2019-09-10 ENCOUNTER — Ambulatory Visit (INDEPENDENT_AMBULATORY_CARE_PROVIDER_SITE_OTHER): Payer: Medicare Other | Admitting: Internal Medicine

## 2019-09-10 ENCOUNTER — Encounter: Payer: Self-pay | Admitting: Internal Medicine

## 2019-09-10 DIAGNOSIS — M818 Other osteoporosis without current pathological fracture: Secondary | ICD-10-CM | POA: Diagnosis not present

## 2019-09-10 DIAGNOSIS — M3130 Wegener's granulomatosis without renal involvement: Secondary | ICD-10-CM | POA: Insufficient documentation

## 2019-09-10 DIAGNOSIS — M810 Age-related osteoporosis without current pathological fracture: Secondary | ICD-10-CM | POA: Diagnosis present

## 2019-09-10 LAB — COMPREHENSIVE METABOLIC PANEL
ALT: 18 U/L (ref 0–44)
AST: 15 U/L (ref 15–41)
Albumin: 3.8 g/dL (ref 3.5–5.0)
Alkaline Phosphatase: 39 U/L (ref 38–126)
Anion gap: 10 (ref 5–15)
BUN: 15 mg/dL (ref 6–20)
CO2: 24 mmol/L (ref 22–32)
Calcium: 8.6 mg/dL — ABNORMAL LOW (ref 8.9–10.3)
Chloride: 106 mmol/L (ref 98–111)
Creatinine, Ser: 0.8 mg/dL (ref 0.44–1.00)
GFR calc Af Amer: >60 mL/min (ref >60)
GFR calc non Af Amer: >60 mL/min (ref >60)
Glucose, Bld: 88 mg/dL (ref 70–99)
Potassium: 4 mmol/L (ref 3.5–5.1)
Sodium: 140 mmol/L (ref 135–145)
Total Bilirubin: 0.3 mg/dL (ref 0.3–1.2)
Total Protein: 6.7 g/dL (ref 6.5–8.1)

## 2019-09-10 LAB — CBC WITH DIFFERENTIAL/PLATELET
Abs Immature Granulocytes: 0.05 10*3/uL (ref 0.00–0.07)
Basophils Absolute: 0.1 10*3/uL (ref 0.0–0.1)
Basophils Relative: 1 %
Eosinophils Absolute: 0.1 10*3/uL (ref 0.0–0.5)
Eosinophils Relative: 1 %
HCT: 41.2 % (ref 36.0–46.0)
Hemoglobin: 13.4 g/dL (ref 12.0–15.0)
Immature Granulocytes: 1 %
Lymphocytes Relative: 26 %
Lymphs Abs: 2.3 10*3/uL (ref 0.7–4.0)
MCH: 30 pg (ref 26.0–34.0)
MCHC: 32.5 g/dL (ref 30.0–36.0)
MCV: 92.4 fL (ref 80.0–100.0)
Monocytes Absolute: 0.7 10*3/uL (ref 0.1–1.0)
Monocytes Relative: 8 %
Neutro Abs: 5.7 10*3/uL (ref 1.7–7.7)
Neutrophils Relative %: 63 %
Platelets: 342 10*3/uL (ref 150–400)
RBC: 4.46 MIL/uL (ref 3.87–5.11)
RDW: 13.9 % (ref 11.5–15.5)
WBC: 9 10*3/uL (ref 4.0–10.5)
nRBC: 0 % (ref 0.0–0.2)

## 2019-09-10 NOTE — Patient Instructions (Addendum)
ICD-10-CM   1. 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

## 2019-09-10 NOTE — Progress Notes (Signed)
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      59 year old female. 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  CAre direction is from Dr [name] at [facility] in [place], [place]. Main reason she is here she wants local pulmonologist back up  Reports that in  1998 when pregnant with daughter she developed severe headaches and sinusitis. She recollects mulitple inteventions and Rx prior sinus surgery x 2, prior right tear duct surgery at [facility] for epiphora (with subsequent re-obstruction). She also has a history of nasal saddling and inflammation left greater than right.  And biopsies with no diagnosis. In 1999 had upper airway obstruction and underwent emergency trach at [facility] (Dr [name]). Other interventions include:  Resuled in visit to [facility] and Dr [name] making diagnosis of GPA.   She has been treated in the past with Cytoxan and prednisone, methotrexate very briefly, Cytoxan orally, then Imuran for some time, and then off treatment for some years. She was started on prednisone alone in 2009, and then in 06/2008 the azathioprine was added again at 150 mg daily, and variable doses of prednisone. Then in end 2013, early 2013 had relapse and needed rituxan and was felt  She could not go below prednisone '20mg'$  per day. She had first dose at [facility] and subsequent 3 weekly  doses with [name] early 2013.  Aside from the Wegener, she has a history of osteopenia, history of migraines,  history of GERD, history of mitral valve prolapse, and a history of MRSA infection remotely (1999).   In 2014: but it appers disease in stable/remission with prednisone and in 2014: she was able to get disability. In May 2015 She followed with [name], disease was felt to be stable. HEr main issues were fatigue, insomnia , anxiety, and mild anxiety. SHe has clarified that overall she has handled lif stressors well and does not suffer from anxiety disorder per se. IF atll anxiety is mild and only situational  She is now referred because she wants a local Rx team. She feels her [facility] eNT team is retiring. And apparently Dr [name] at [facility] is moving towards research + expensive to fly up there. So far Rx decisions have come from [facility] and not from [facility]. She is making a trip up there in August 2015 and will see if she can continue going there but she will likely look for increasing Rx decisions from [place].  From pulm perspective, she prefers [facility] for admissions and prefers pulmonary admit her if she became critically ill. She wants airway support in [place]; we discussed local ENT docs. For now, she wants to hold off establishing someone as OPD. She is aware that if critical, ER and PCCM will coordinate that.  She also wants me to be her resouruce locally  I have agreed to her care plan     Smoking hx  reports that she has never smoked.  She has never used smokeless tobacco.   CXR Oct 2012   RADIOLOGY REPORT*  Clinical Data: Cough for 5 days. Wegener's granulomatosis.  CHEST - 2 VIEW  Comparison: None.  Findings: Emphysema is present with flattening of hemidiaphragms  and enlargement retrosternal clear space. Bronchitic changes are  present in the lower lobes bilaterally. Cardiopericardial  silhouette and mediastinal contours appear within normal limits.  Vertebral body height is preserved. No destructive osseous lesions  are identified. No plain film evidence of adenopathy.    IMPRESSION:  Hyperinflation consist with emphysema. No active cardiopulmonary  disease.  Original Report Authenticated By: Dereck Ligas, M.D.    Ok Edwards 06/18/2014  Chief Complaint  Patient presents with  . Follow-up    Pt state since last OV pt states she is feeling better. Pt states she is going to [facility] in November. Pt denies SOB, cough and CP/tightness.     Followup GPA   - She has postponed trip to [facility] due to father and father in law being sick with cancer. She did have soe headache, MRI Was normal per hx and headaches attributed to ativan weaning (she is tyrin to get herself off ativan which she has been on for decades). Shee feels wegner under remission. No hematuria or respiratory issues. Will defer labs to [facility] visit. Recent PR-3 aparently up a point. She has not had PREVNAR and will have it 06/18/2014   She did clarify to me that plan in case of airway emergency is NOT for [place] as indicated in visit June 2015 but [place] where she has new team (pripr docs have retired)     Past: She is trying to wean herself off [place] 01/22/2015  Chief Complaint  Patient presents with  . Follow-up    [facility] with [facility]. Pt stated her breathing is doing well. Pt c/o mild prod cough with clear mucus. Pt denies CP/tightness.     Follow-up GPA  Last seen October 2015. After that she went for follow-up to [facility] in [place], [place]. Overall she's doing well. No respiratory issues. Occasionally there is some voice change. This is due to subglottic stenosis. [facility]: Nov 2015 patient is concerned about the fact her B cells are low. She wants to know what this means. I will try to get in touch with  [facility]  Drs [name] attending or Dr Regan Lemming ? Fellow to answer this questionENT at [facility] evaluated her noted to have minimal erythema at right nare but stable subglottic area without inflammation ENT at [facility] evaluated her noted  to have minimal erythema at right nare but stable subglottic area without inflammation. At that visit Nov 2015 - per notes  B Cell pending but PR-3 ANCA is 0.2.  Ovverall disease GPA was felt to be in remission  Their recommendaton 1) . She also asked them about coming off prednisone. They did not that she has had prednisoen burst 4-5 ties between her visit with them April 2014 and Nov 2015 with last burst in may 2015. They have maintained equipose in recommending if she can come off prednisone but are ok wit her trying to come off which she has done at this point. 2)  monitor Pr-3 Q3 months 3) continue bactrim 1 tab daily ( I do not see it in her [facility] today may 2016 visit)   [facility] 07/10/2014 notes reviewed and things I learned are  - GPA diagnosed June 1998 due  ing 2nd prednancy.   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  - July 1998 sinus bx at [facility] - nonspecific  - Aug 1998 - left parotid bx - non specific  - Oct 1999 - c-ANCA now postive - Rx 5 weeeks  - Nov 1999 - acut closure of subglottis - Rx Cytoxan, [facility] and laser  - Dec 1999 - [facility] Appt with Dr Adan Sis Rx cytoxan and then later imuran  - Late 2002 - stop imuran and after that occ prd burst adn regular bactrim  - 2009 - recurrence with ENT symptoms: restart immuran, continue bactrim   -July 2010 - Flare up with significant nasal and subglottic disease despite high dose immuran. s/p induction rituxan with subsequent slow to reconstitute and maintained on prednisone '5mg'$  daily which seems to control her symptoms  And since then ANCA- negative  - April 2013: [place] 1 but ANCA negative (was positive prior to rituxan) - April 2014: Last seen by Dr Adan Sis prior to Nov 2015 visit. PR-3 anca negative this time - Rx Bactrim 1DS BID. Risk for relapse considered low  Past medical history since I las saw  her - Got her second reclast  infusion recently and apparently this infusion was too fast and she had some side effects and intolerance - She has tennis elbow on right side  - She also has a left fourth toe bruising/fracture being managed conservatively - tried to wean off ativan (chronic) but did not work  1) Pred started in 1998/1999 - was off of it for about 1 year in the last 18 years but has been consistently on it since beginning.  2) Pt was started in Bactrim in 1999 - has been on this daily since then at '400mg'$  daily. Pt was on '800mg'$  but decreased to '400mg'$ . Pt was told this keeps her PNA at [place]. 3) ANCA is being managed by [name] at the [place] [facility] MONTH[facility]. Pt receives a lab kit in them mail, takes it to [facility] [place] [facility] and once this is drawn the kit is mailed back off.    OV 09/14/2015  Chief Complaint  Patient presents with  . Follow-up    Pt recently fractured her left foot. Pt states she recently had BIL varicose vein surgery. Pt c/o prod cough with green mucus. Pt denies CP/tightness and SOB.    Follow-up granulomatous polyangiitis  Six-month follow-up. In interim has fractured her left foot particularly the left fifth metatarsal and has been in acast for the last few months/several weeks. She is under the care of Dr. [name] at [place]. She has a new primary care physician. She feels vasculitis is in remission. Last visit to [facility] was in November 2015 and current follow-up is on hold until the cast comes off which he hopes this today. After this she will start physical therapy at [place] possibly. Because of the fracture which is been deemed due to osteoporosis prednisone has been cut down to 4 mg per day by her and new endocrinologist Dr. [name]. At this point in time she has a cough which is baseline for the winter but she does not feel she is infected and overall feels vasculitis is in remission.   OV 12/28/2017  Chief  Complaint  Patient presents with  . Follow-up    Last seen 09/14/15.  Pt states oncologist wanted pt to come make a visit with MR. States she has been coughing a lot with clear to yellow mucus. pt stated she had the flu and bacterial infection beginning of April 2019.   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  It is been over 2 years since I last saw her.  In the interim in May 2018 had follow-up visit at [facility].  She reports that she saw Dr. Claire Shown national expert and this condition.  She was advised that her B cells has still undetectable/very mildly detectable.  So she continues on Bactrim.  She is on observation therapy.  Her disease is being considered [burnt out/remission].  She saw ENT Dr. Shawn Stall.  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  She states that even [facility] is advised her that only Dr. Shawn Stall at [facility] can do the surgery.  Her next visit to [facility] in [place] is scheduled somewhere between May and July 2019.  She is trying to figure out the finances for her trip now that she separated from her husband and their financial issues.  From a pulmonary perspective she reports that 3 or 4 months ago she tried to wean herself off her acid reflux medications and prednisone and when she did that successfully her cough got worse/started - new issue for me.  She was seen at [facility] for worsening arthralgia.  Prescribed Plaquenil but this did not go well with her.  She still is remaining off the Plaquenil.  Then approximately 3 or 4 weeks ago early part of April 2019 she had a bronchitis episode with colored sputum suspected influenza and was given antibiotics at this point in time the cough got worse.  She then reintroduced her acid reflux medications of PPI and H2 blockade.  After this and the treatment of her bronchitis  her cough has gotten better but still not back to full baseline.  From a energy perspective she is doing overall better.  Of note last year at [facility] her ANCA antibodies were negative  FeNO 12/28/2017 - 26 ppb and mikdly elevated in grey zone   OV 07/02/2018  Subjective:  Patient ID: Theresa [name], female , DOB: 03/22/1961 , age 43 y.o. , MRN: [id number] , ADDRESS: [place] [id number]   07/02/2018 -   Chief Complaint  Patient presents with  . Follow-up    chronic cough     HPI NOORAH GIAMMONA OBrien 59 y.o. -presents for follow-up of chronic cough for which inhaled steroids help.  This in the background of burned-out vasculitis where she had suppressed B cells after one course Rx with rituxan nearly a decade ago  .  In the interim she did visit with Dr.Ulrich Specks world expert and vasculitis disorders at the [facility] in [place], [place].  This was in July 2019.  I reviewed the records from [facility]. Marland Kitchen  She had CT scan of the sinuses that showed opacification and osteitis of the maxillary sinus.  There is also inward bowing of the axillary sinus.  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  This was interpreted as nonspecific in the context of previous GPA without sign of disease activity.  Patient states these findings are chronic.  At the time of her visit with him his B-cell counts were pending but she tells  me that this slowly reemerging.  Her ESR and C-reactive protein were normal.  She did show me her PR-3 levels is also slowly reemerging.  Her gammaglobulin level had improved to 904 in July 2019 at the [facility] she is trying to get hold of the [facility] to get an interpretation on this.  After this visit I also emailed Dr. Claire Shown and waiting to hear.  Her [place] vasculitis score was 0.  But patient tells me that she feels fatigued all the time particularly after coming off prednisone.  However she does not want to go back on prednisone.   Her cough is an issue and she wants a refill on the inhaled steroids.  At the [facility] she did express that acid reflux is a major problem for her.  She takes Bactrim but a bit little bit less rigid than the prescribed schedule. He has recommended GI evaluation to exclude the possibility of Barrett's esophagus or gastritis. In October 2019 she followed up with Dr. [name] ENT at [facility].  The laryngeal exam was stable and showed mild tracheal stenosis.  Continued observation therapy was recommended.  She gets her labs drawn through the oncology clinic at [facility] with Dr. [name] but the kits come from the [facility].  We discussed about doing these tests here locally but she prefers that the kit  come from the [facility] itself and Dr. [name] sense of the blood   Lab test at the [facility] - PR3:  . antibody less than 0.2 and normal throughout 2019 but in July 2019 up at 0.06 June 2018 it is up at 0.5 -she is worried about this -MPO -October 2019 [facility] were done at [facility] long: < 0.2 normal -CD20-B-cell percent - 1% (40)  prior to 2017 and low but now 6% (119) and ability to start or do it put additional thank you   telphone juluy 2020 Updated 6:14 PM 04/03/2019 - spoke to Theresa [name] - says not spoken to Dr [name]. - has appt 04/24/2019.  Went to [facility] - for sinus issues 2-3 months - Rx amoxicillin/prednisone but 2nd ddx is vasculitis she says per RN. Also having fatigue worse.  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 Nasal swab 6/3-0/2020 - negative. Says overall is moderate. 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   - suggested she try to resolve with [facility] by  04/14/2019   Telephone sept 2020 Called [phone] Samson Frederic OBrien 1:24 PM 06/03/2019 - wanted  To update me  Said she did virtual visit with [facility]. Labs look worse. Dr Leanne Lovely wants to see her on site at [facility] in [place] and this will be in late October. Also going to see ENT. Has been having epistaxis. Otherwise well. Says Dr Hilarie Fredrickson planning UGI endoscopy in 2 days 06/05/2019 to rule out Barretts. (neg covid June 2020). Dr Hilarie Fredrickson decide[facility] and waiting to hear from anesthesia. Patient says she had laryngospasm issue x 1 of many times before - Dr Hilarie Fredrickson deciding on location based on this history   OV 09/10/2019- telephone visit  Chief Complaint  Patient presents with  . Follow-up  no symptoms, flare up of Wegener's granulamatosis   On 07/15/2019 - Has now seen Dr Mariane Baumgarten [facility] via telephoine  - he has recommended covid vaccine due to B cell depletion from Rituxan - suppressed for years but had re-emerged. So he thought if she took Rituxan again it would deplete B cells. He felt vasculitis has relapsed. She was on prednisone '20mg'$  per day and helping. Strated on cellcept as alternative to rituxan.currently on that, prednisone and bactrim. Plan is to u se this and get covid vaccine and then go to Rituxan. Having side effects - feeling poorly , gained6#, feeling groggy.  Then saw [facility] 08/08/2019 as part of establishing a new team with MD attrition due to relocation/retirement over time.  SAw Dr Ezzard Flax. Has recommended budesonide and tobra irrigations and followup in 2 months. Followed with them Dr Patrice Paradise  Sep 08, 2019 - > felt airway stenosis stable. Dx Is chronic pansinusitis, subgloticc stenosis, and muscle tension dysphonia   We discussed covid vaccine and Mab rx  ROS - per HPI     has a past medical history of Allergy, Anxiety, Arthritis, Calyceal diverticulum of kidney, Cholelithiasis, Diverticulosis, Gastritis, GERD (gastroesophageal reflux  disease), Headaches, cluster, Heart murmur, MRSA infection, Mitral valve prolapse, Osteoporosis, Thyroid disease, Tinnitus, Varicose veins, and Wegener's granulomatosis ([place]) (1998).   reports that she has never smoked. She has never used smokeless tobacco.  Past Surgical History:  Procedure Laterality Date  . AIRWAY FOREIGN BODY REMOVAL     with tracheostomy  . COLONOSCOPY    . ENDOVENOUS ABLATION SAPHENOUS VEIN W/ LASER Left 06-03-2015   endovenous laser ablation left greater saphenous vein and stab phlebectomy 10-20 incisions left leg by Curt Jews MD  . ENDOVENOUS ABLATION SAPHENOUS VEIN W/ LASER Right 06-24-2015   endovenous laser ablation right greater saphenous vein and stab phlebectomy 10-20 incisions   . NASAL SINUS SURGERY    . TEAR DUCT PROBING Right    x 2  . TEAR DUCT PROBING Left   . TRACHEOSTOMY CLOSURE     over 30 surgeries  . UPPER GASTROINTESTINAL ENDOSCOPY      Allergies  Allergen Reactions  . Codeine Itching  . Eletriptan Nausea Only  . Vancomycin Hives  . Tape Rash    Immunization History  Administered Date(s) Administered  . Hep B / HiB 11/02/2013  . Influenza Split 06/04/2013, 05/19/2014  . Influenza,inj,Quad PF,6+ Mos 07/20/2015  . Influenza,inj,Quad PF,6-35 Mos 06/05/2019  . Influenza-Unspecified 07/20/2015, 06/07/2017  . Pneumococcal Conjugate-13 06/18/2014    Family History  Problem Relation Age of Onset  . Asthma Daughter   . Irritable bowel syndrome Daughter   . Irritable bowel syndrome Sister   . Other Sister        Chiropractor   . CAD Father   . Skin cancer Father   . Heart disease Father   . Osteoporosis Father   . COPD Father   . Prostate cancer Father   . Breast cancer Sister   . Irritable bowel syndrome Sister   . Ulcerative colitis Sister   . Hypertension Mother   . Osteoporosis Mother   . Breast cancer Mother 37       IDC  . Asthma Sister   . Other Sister        PVC's  . Colon cancer Neg Hx   . Pancreatic  cancer Neg Hx   . Stomach cancer Neg Hx   . Liver disease Neg Hx   .  Esophageal cancer Neg Hx   . Rectal cancer Neg Hx      Current Outpatient Medications:  .  albuterol (PROVENTIL HFA;VENTOLIN HFA) 108 (90 BASE) MCG/ACT inhaler, Inhale 2 puffs into the lungs every 6 (six) hours as needed. For shortness of breath or wheezing, Disp: , Rfl:  .  denosumab (PROLIA) 60 MG/ML SOSY injection, Inject 60 mg into the skin every 6 (six) months., Disp: , Rfl:  .  dexlansoprazole (DEXILANT) 60 MG capsule, Take 1 capsule (60 mg total) by mouth 2 (two) times daily., Disp: 180 capsule, Rfl: 3 .  fluticasone (FLONASE) 50 MCG/ACT nasal spray, Place 2 sprays into the nose daily., Disp: , Rfl:  .  Hypertonic Nasal Wash (SINUS RINSE BOTTLE KIT NA), Place into the nose. Adding Topamax and Budesomibe added to rinse, Disp: , Rfl:  .  Lactobacillus Rhamnosus, GG, (CULTURELLE PO), Take 1 capsule by mouth 2 (two) times a day., Disp: , Rfl:  .  LORazepam (ATIVAN) 1 MG tablet, Take 1 tablet (1 mg total) by mouth 2 (two) times daily as needed for anxiety. (Patient taking differently: Take 2-3 mg by mouth at bedtime. ), Disp: 60 tablet, Rfl: 1 .  Multiple Vitamin (MULTIVITAMIN) tablet, Take 1 tablet by mouth daily., Disp: , Rfl:  .  Multiple Vitamins-Minerals (PRESERVISION AREDS 2 PO), Take 1 tablet by mouth daily. , Disp: , Rfl:  .  mycophenolate (CELLCEPT) 500 MG tablet, Take 1,000 mg by mouth 2 (two) times daily., Disp: , Rfl:  .  predniSONE (DELTASONE) 5 MG tablet, Take 10 mg by mouth daily with breakfast. , Disp: , Rfl:  .  sulfamethoxazole-trimethoprim (BACTRIM) 400-80 MG tablet, Take 1 tablet by mouth 2 (two) times daily., Disp:  , Rfl:  .  zolpidem (AMBIEN) 10 MG tablet, Take one tablet hs prn.  Do not take with Ativan, Disp: 30 tablet, Rfl: 1      Objective:   There were no vitals filed for this visit.  Estimated body mass index is 25.87 kg/m as calculated from the following:   Height as of 07/17/19: '5\' 6"'$   (1.676 m).   Weight as of 07/17/19: 160 lb 4.8 oz (72.7 kg).  '@WEIGHTCHANGE'$ @  There were no vitals filed for this visit.   Physical Exam telpehone visit        Assessment:       ICD-10-CM   1. Granulomatosis with polyangiitis, unspecified whether renal involvement ([place])  M31.30        Plan:     Patient Instructions     ICD-10-CM   1. Granulomatosis with polyangiitis, unspecified whether renal involvement ([place])  M31.30     Continue care plan outlined by [facility] Discussed cellcept  Plan  - routine 30 min followup in 3-6 months - on-site visit - keep up with [facility] and [facility]      ( Level 02 visit: 15-29 min visit spent in  telephone  in total care time and counseling or/and coordination of care by this undersigned MD - Dr Brand Males. This includes one or more of the following for care delivered on 09/10/2019 same day: pre-charting, chart review, note writing, documentation discussion of test results, diagnostic or treatment recommendations, prognosis, risks and benefits of management options, instructions, education, compliance or risk-factor reduction. It excludes time spent by the [place] or office staff in the care of the patient)   SIGNATURE    Dr. Brand Males, M.D., F.C.C.P,  Pulmonary and Critical Care Medicine Staff Physician, [facility] Director - Interstitial Lung Disease  Program  Pulmonary [place] at [place], [place], [address]  Pager: [phone], If no answer or between  15:00h - 7:00h: call [phone] Telephone: [phone]  9:26 AM 09/10/2019

## 2019-09-10 NOTE — Telephone Encounter (Signed)
Mychart message received from pt which is posted below as an FYI for MR:   To: [facility] POOL    From: COLETTA NAWAZ    Created: 09/10/2019 9:27 AM     *-*-*This message was handled on 09/10/2019 10:04 AM by Rosana Berger*-*-*  Pleasure speaking with you.   Medication plan for WG flare:- - Prednisone:  20 mg daily until CellCept target dose (1000 mg twice daily) achieved (11/2 - 12/20) then,  >15 mg daily x 2 weeks (12/21- 1/3) then,  10 mg daily x 2 weeks (1/4 - 1/17) then provide symptom update  - CellCept:  500 mg twice day x 7 days (12/7 -12/13)  1000 mg mornings & 500 mg evenings x 7 days (12/14 - 12/20)  1000 mg twice daily (target dose) on 12/21 and thereafter (add additional labs at this time if needed to check for therapeutic level due to concomitant antacid (Dexilant) use  - Medication lab monitoring of WBC schedule:  weekly until target dose achieved (next due 09/15/2019) then  every 2 weeks for a month then  monthly thereafter  - Bactrim SS daily for PJP prophylaxis  - Reconsider rituximab treatment following successful COVID-19 vaccination  - 6 month return for a complete re-evaluation (including ENT) due May 2021     This is plan crated by Dr. [name] from [facility].

## 2019-09-12 NOTE — Telephone Encounter (Signed)
Please let patient know that I am comfortable with these medications and have her handle other patients on the same medications.  I support the plan laid out by [facility].  I also noted that Dr. [name] will be monitoring the labs.  I am available as needed.  Please make sure that this regimen is in the permanent part of the medical record

## 2019-09-14 ENCOUNTER — Encounter: Payer: Self-pay | Admitting: Oncology

## 2019-09-15 ENCOUNTER — Inpatient Hospital Stay: Payer: Medicare Other

## 2019-09-17 ENCOUNTER — Other Ambulatory Visit: Payer: Self-pay

## 2019-09-17 ENCOUNTER — Inpatient Hospital Stay: Payer: Medicare Other

## 2019-09-17 DIAGNOSIS — M3130 Wegener's granulomatosis without renal involvement: Secondary | ICD-10-CM

## 2019-09-17 LAB — COMPREHENSIVE METABOLIC PANEL
ALT: 15 U/L (ref 0–44)
AST: 14 U/L — ABNORMAL LOW (ref 15–41)
Albumin: 3.7 g/dL (ref 3.5–5.0)
Alkaline Phosphatase: 43 U/L (ref 38–126)
Anion gap: 10 (ref 5–15)
BUN: 13 mg/dL (ref 6–20)
CO2: 25 mmol/L (ref 22–32)
Calcium: 8.9 mg/dL (ref 8.9–10.3)
Chloride: 105 mmol/L (ref 98–111)
Creatinine, Ser: 0.93 mg/dL (ref 0.44–1.00)
GFR calc Af Amer: >60 mL/min (ref >60)
GFR calc non Af Amer: >60 mL/min (ref >60)
Glucose, Bld: 65 mg/dL — ABNORMAL LOW (ref 70–99)
Potassium: 3.7 mmol/L (ref 3.5–5.1)
Sodium: 140 mmol/L (ref 135–145)
Total Bilirubin: 0.4 mg/dL (ref 0.3–1.2)
Total Protein: 6.5 g/dL (ref 6.5–8.1)

## 2019-09-17 LAB — CBC WITH DIFFERENTIAL/PLATELET
Abs Immature Granulocytes: 0.04 10*3/uL (ref 0.00–0.07)
Basophils Absolute: 0.1 10*3/uL (ref 0.0–0.1)
Basophils Relative: 1 %
Eosinophils Absolute: 0.1 10*3/uL (ref 0.0–0.5)
Eosinophils Relative: 2 %
HCT: 41.3 % (ref 36.0–46.0)
Hemoglobin: 13.3 g/dL (ref 12.0–15.0)
Immature Granulocytes: 1 %
Lymphocytes Relative: 39 %
Lymphs Abs: 3.3 10*3/uL (ref 0.7–4.0)
MCH: 30.1 pg (ref 26.0–34.0)
MCHC: 32.2 g/dL (ref 30.0–36.0)
MCV: 93.4 fL (ref 80.0–100.0)
Monocytes Absolute: 0.9 10*3/uL (ref 0.1–1.0)
Monocytes Relative: 11 %
Neutro Abs: 3.9 10*3/uL (ref 1.7–7.7)
Neutrophils Relative %: 46 %
Platelets: 350 10*3/uL (ref 150–400)
RBC: 4.42 MIL/uL (ref 3.87–5.11)
RDW: 13.8 % (ref 11.5–15.5)
WBC: 8.4 10*3/uL (ref 4.0–10.5)
nRBC: 0 % (ref 0.0–0.2)

## 2019-09-18 ENCOUNTER — Encounter: Payer: Self-pay | Admitting: Oncology

## 2019-09-26 ENCOUNTER — Encounter: Payer: Self-pay | Admitting: Oncology

## 2019-09-26 ENCOUNTER — Other Ambulatory Visit: Payer: Self-pay | Admitting: *Deleted

## 2019-09-26 ENCOUNTER — Telehealth: Payer: Self-pay | Admitting: Oncology

## 2019-09-26 DIAGNOSIS — M3130 Wegener's granulomatosis without renal involvement: Secondary | ICD-10-CM

## 2019-09-26 NOTE — Telephone Encounter (Signed)
I left a message regarding 1/27 per 1/22 sch msg early as possible

## 2019-09-28 ENCOUNTER — Encounter: Payer: Self-pay | Admitting: Oncology

## 2019-10-01 ENCOUNTER — Other Ambulatory Visit: Payer: Medicare Other

## 2019-10-01 ENCOUNTER — Inpatient Hospital Stay: Payer: Medicare Other

## 2019-10-01 ENCOUNTER — Other Ambulatory Visit: Payer: Self-pay

## 2019-10-01 DIAGNOSIS — M3130 Wegener's granulomatosis without renal involvement: Secondary | ICD-10-CM | POA: Diagnosis not present

## 2019-10-01 LAB — CBC WITH DIFFERENTIAL (CANCER CENTER ONLY)
Abs Immature Granulocytes: 0.04 10*3/uL (ref 0.00–0.07)
Basophils Absolute: 0.1 10*3/uL (ref 0.0–0.1)
Basophils Relative: 1 %
Eosinophils Absolute: 0.2 10*3/uL (ref 0.0–0.5)
Eosinophils Relative: 2 %
HCT: 43.6 % (ref 36.0–46.0)
Hemoglobin: 13.9 g/dL (ref 12.0–15.0)
Immature Granulocytes: 1 %
Lymphocytes Relative: 37 %
Lymphs Abs: 2.8 10*3/uL (ref 0.7–4.0)
MCH: 29.8 pg (ref 26.0–34.0)
MCHC: 31.9 g/dL (ref 30.0–36.0)
MCV: 93.6 fL (ref 80.0–100.0)
Monocytes Absolute: 0.8 10*3/uL (ref 0.1–1.0)
Monocytes Relative: 11 %
Neutro Abs: 3.6 10*3/uL (ref 1.7–7.7)
Neutrophils Relative %: 48 %
Platelet Count: 344 10*3/uL (ref 150–400)
RBC: 4.66 MIL/uL (ref 3.87–5.11)
RDW: 13.4 % (ref 11.5–15.5)
WBC Count: 7.4 10*3/uL (ref 4.0–10.5)
nRBC: 0 % (ref 0.0–0.2)

## 2019-10-01 LAB — CMP (CANCER CENTER ONLY)
ALT: 14 U/L (ref 0–44)
AST: 14 U/L — ABNORMAL LOW (ref 15–41)
Albumin: 3.7 g/dL (ref 3.5–5.0)
Alkaline Phosphatase: 41 U/L (ref 38–126)
Anion gap: 9 (ref 5–15)
BUN: 15 mg/dL (ref 6–20)
CO2: 25 mmol/L (ref 22–32)
Calcium: 8.9 mg/dL (ref 8.9–10.3)
Chloride: 108 mmol/L (ref 98–111)
Creatinine: 0.97 mg/dL (ref 0.44–1.00)
GFR, Est AFR Am: >60 mL/min (ref >60)
GFR, Estimated: >60 mL/min (ref >60)
Glucose, Bld: 57 mg/dL — ABNORMAL LOW (ref 70–99)
Potassium: 4 mmol/L (ref 3.5–5.1)
Sodium: 142 mmol/L (ref 135–145)
Total Bilirubin: 0.3 mg/dL (ref 0.3–1.2)
Total Protein: 6.7 g/dL (ref 6.5–8.1)

## 2019-10-01 NOTE — Telephone Encounter (Signed)
MR- please advise on pt email thanks!  Hi Dr. Chase Caller,  During the webinar Dr. Mattie Marlin advised individuals with immune suppression and other issues should be proactive in getting a letter from their physician stating the necessity for a Covid vaccine.   When my vaccine "time" comes I'll have a letter stating whatever they may need. So, no hurry, but if you have a chance would you mind writing a note for me. I can also reach out to my PCP or [place] physicians as well- not a problem- but I think a letter from a local physician would be impactful.   Many thanks and stay safe!  Klaire Cornfield Dolan-O'Brien [phone]

## 2019-10-02 ENCOUNTER — Other Ambulatory Visit: Payer: Self-pay | Admitting: *Deleted

## 2019-10-02 ENCOUNTER — Encounter: Payer: Self-pay | Admitting: Oncology

## 2019-10-02 ENCOUNTER — Encounter: Payer: Self-pay | Admitting: *Deleted

## 2019-10-02 DIAGNOSIS — M3130 Wegener's granulomatosis without renal involvement: Secondary | ICD-10-CM

## 2019-10-02 NOTE — Progress Notes (Signed)
See my chart message per cellcept lab inquiry

## 2019-10-02 NOTE — Telephone Encounter (Signed)
  To Whom It May Concern   This is to state that Theresa [name] Jul 13, 1961  is a patient of mine and has significant medical issues and is immunocompromised. Therefore, she should be considered highest risk eligibility category for covid-19 vaccine. She does not have a history of prior allergy to a vaccine or injectable per our established records   Sincerely yours    SIGNATURE    Dr. Brand Males, M.D., F.C.C.P,  Pulmonary and Critical Care Medicine Staff Physician, [place] Director - Interstitial Lung Disease  Program  Pulmonary [place] at [place], [place], [address]  Pager: [phone], If no answer or between  15:00h - 7:00h: call [phone] Telephone: [phone]  3:18 PM 10/02/2019    Allergies  Allergen Reactions  . Codeine Itching  . Eletriptan Nausea Only  . Vancomycin Hives  . Tape Rash

## 2019-10-06 ENCOUNTER — Inpatient Hospital Stay: Payer: Medicare Other

## 2019-10-08 ENCOUNTER — Other Ambulatory Visit: Payer: Self-pay

## 2019-10-08 ENCOUNTER — Inpatient Hospital Stay: Payer: Medicare Other | Attending: Oncology

## 2019-10-08 DIAGNOSIS — Z7952 Long term (current) use of systemic steroids: Secondary | ICD-10-CM | POA: Diagnosis not present

## 2019-10-08 DIAGNOSIS — E079 Disorder of thyroid, unspecified: Secondary | ICD-10-CM | POA: Diagnosis not present

## 2019-10-08 DIAGNOSIS — Z79899 Other long term (current) drug therapy: Secondary | ICD-10-CM | POA: Insufficient documentation

## 2019-10-08 DIAGNOSIS — M3130 Wegener's granulomatosis without renal involvement: Secondary | ICD-10-CM | POA: Diagnosis not present

## 2019-10-08 DIAGNOSIS — I341 Nonrheumatic mitral (valve) prolapse: Secondary | ICD-10-CM | POA: Diagnosis not present

## 2019-10-08 LAB — COMPREHENSIVE METABOLIC PANEL
ALT: 19 U/L (ref 0–44)
AST: 19 U/L (ref 15–41)
Albumin: 3.7 g/dL (ref 3.5–5.0)
Alkaline Phosphatase: 42 U/L (ref 38–126)
Anion gap: 6 (ref 5–15)
BUN: 20 mg/dL (ref 6–20)
CO2: 25 mmol/L (ref 22–32)
Calcium: 8.9 mg/dL (ref 8.9–10.3)
Chloride: 106 mmol/L (ref 98–111)
Creatinine, Ser: 0.93 mg/dL (ref 0.44–1.00)
GFR calc Af Amer: >60 mL/min (ref >60)
GFR calc non Af Amer: >60 mL/min (ref >60)
Glucose, Bld: 64 mg/dL — ABNORMAL LOW (ref 70–99)
Potassium: 4 mmol/L (ref 3.5–5.1)
Sodium: 137 mmol/L (ref 135–145)
Total Bilirubin: 0.4 mg/dL (ref 0.3–1.2)
Total Protein: 6.6 g/dL (ref 6.5–8.1)

## 2019-10-08 LAB — CBC WITH DIFFERENTIAL/PLATELET
Abs Immature Granulocytes: 0.03 10*3/uL (ref 0.00–0.07)
Basophils Absolute: 0.1 10*3/uL (ref 0.0–0.1)
Basophils Relative: 1 %
Eosinophils Absolute: 0.1 10*3/uL (ref 0.0–0.5)
Eosinophils Relative: 2 %
HCT: 41.3 % (ref 36.0–46.0)
Hemoglobin: 13.2 g/dL (ref 12.0–15.0)
Immature Granulocytes: 0 %
Lymphocytes Relative: 29 %
Lymphs Abs: 2.2 10*3/uL (ref 0.7–4.0)
MCH: 29.9 pg (ref 26.0–34.0)
MCHC: 32 g/dL (ref 30.0–36.0)
MCV: 93.7 fL (ref 80.0–100.0)
Monocytes Absolute: 0.9 10*3/uL (ref 0.1–1.0)
Monocytes Relative: 11 %
Neutro Abs: 4.3 10*3/uL (ref 1.7–7.7)
Neutrophils Relative %: 57 %
Platelets: 327 10*3/uL (ref 150–400)
RBC: 4.41 MIL/uL (ref 3.87–5.11)
RDW: 13.5 % (ref 11.5–15.5)
WBC: 7.6 10*3/uL (ref 4.0–10.5)
nRBC: 0 % (ref 0.0–0.2)

## 2019-10-09 ENCOUNTER — Other Ambulatory Visit: Payer: Medicare Other

## 2019-10-09 LAB — MYCOPHENOLIC ACID (CELLCEPT)
MPA Glucuronide: 28 ug/mL (ref 15–125)
MPA: 6.6 ug/mL — ABNORMAL HIGH (ref 1.0–3.5)

## 2019-10-22 ENCOUNTER — Telehealth: Payer: Self-pay | Admitting: Oncology

## 2019-10-22 NOTE — Telephone Encounter (Signed)
I talk with patient about reschedule due to weather 

## 2019-10-23 ENCOUNTER — Other Ambulatory Visit: Payer: Self-pay

## 2019-10-23 ENCOUNTER — Ambulatory Visit: Payer: Self-pay | Admitting: Oncology

## 2019-10-29 NOTE — Progress Notes (Signed)
ID: Theresa [name]  DOB: [birth year]  MR#: [id number]  CSN#: [id number]  [name] Care Team: Antony Contras, MD as PCP - General (Family Medicine) Brand Males, MD as Consulting Physician (Pulmonary Disease) Delrae Rend, MD as Consulting Physician (Endocrinology) Keshanna Riso, Virgie Dad, MD as Consulting Physician (Oncology) Ernestine Conrad, MD as Referring Physician (Otolaryngology) Clinger, Chrystie Nose, MD (Otolaryngology) Pedro Earls, MD as Attending Physician (Family Medicine) Don Broach, MD as Referring Physician (Internal Medicine) Pyrtle, Lajuan Lines, MD as Consulting Physician (Gastroenterology) Lanice Schwab, DO as Referring Physician (Rheumatology) OTHER MD: [name] (fax [phone]); Jaquita Rector; Danton Sewer; Kandis Ban; Huey Romans  CHIEF COMPLAINT: Wegener's granulomatosis  CURRENT TREATMENT: per doctors Specks and Ramaswamy   INTERVAL HISTORY:   Theresa [name] returns today for follow-up of Theresa [name] Wegener's granulomatosis.  Theresa [name] continues to be followed through [facility] under Dr [name].  Theresa [name] also sees Dr. Chase Caller here as a pulmonologist and Dr. Hilarie Fredrickson as Theresa [name] GI doctor  Dr. Mariane Baumgarten started Theresa [name] on Bactrim daily beginning 07/07/2019 for pneumocystis prevention, and prednisone on a taper, currently at 5 mg daily.  Theresa [name] also uses a tobramycin and budesonide nasal wash.  Theresa [name] was started on CellCept on 08/11/2019, currently on a 1000 mg in Theresa a.m. and 500 mg in Theresa evening.  Meanwhile Theresa [name] continues on Prolia with good tolerance.  Theresa [name] most recent dose was 07/17/2019.  Theresa [name] is already scheduled for Theresa [name] next dose on 02/05/2020.  Theresa [name] is scheduled for bone density screening on 11/12/2019 and screening mammogram on 02/09/2020.   REVIEW OF SYSTEMS:  Theresa [name] received Theresa [name] first Deale vaccine dose on 10/28/2019 and will receive Theresa second 11/18/2019.  Theresa [name] is feeling pretty tired and sleepy and does not sleep well at night.  Theresa [name] thinks gabapentin has been helpful.   Theresa [name] has night sweats which come and go.  Theresa [name] has some diarrhea which seems to be improving.  Theresa [name] currently has altered smell and taste issues.  Theresa [name] has minimal epistaxis.  Theresa [name] has a treadmill now which was a gift from Theresa [name] children and Theresa [name] is planning to use it.    HISTORY OF WEGENER'S GRANULOMATOSIS:  From Theresa original intake note:  Theresa [name] tells me Theresa [name] diagnosis of Wegener was initially made at Theresa [facility] by Dr. [name] in 1999.  Theresa [name] has been treated in Theresa past with Cytoxan and prednisone, methotrexate very briefly, Cytoxan orally, then Imuran for some time, and then off treatment for some years.  Theresa [name] was started on prednisone alone in 2009, and then in 06/2008 Theresa azathioprine was added again at 150 mg daily, and variable doses of prednisone.  It is felt that Theresa [name] failed remission maintenance therapy since Theresa [name] could not reduce Theresa [name] dose of prednisone below 20 mg daily without having a resurgence of symptoms, and therefore Theresa [name] has been started on Rituxan with Theresa first dose given at Theresa [facility] on 06/24.  Theresa [name] tolerated Theresa treatment quite well.    Theresa [name] subsequent history is as detailed below   PAST MEDICAL HISTORY: Past Medical History:  Diagnosis Date  . Allergy   . Anxiety   . Arthritis   . Calyceal diverticulum of kidney   . Cholelithiasis   . Diverticulosis   . Gastritis   . GERD (gastroesophageal reflux disease)   . Headaches, cluster   . Heart murmur   . Hx MRSA infection   . Mitral valve prolapse   . Osteoporosis   . Thyroid disease  Thyroid nodules  . Tinnitus   . Varicose veins   . Wegener's granulomatosis ([place]) 1998   autoimmune vasculitits  history of migraines   SURGICAL HISTORY: Past Surgical History:  Procedure Laterality Date  . AIRWAY FOREIGN BODY REMOVAL     with tracheostomy  . COLONOSCOPY    . ENDOVENOUS ABLATION SAPHENOUS VEIN W/ LASER Left 06-03-2015   endovenous laser ablation left greater saphenous vein and stab phlebectomy  10-20 incisions left leg by Curt Jews MD  . ENDOVENOUS ABLATION SAPHENOUS VEIN W/ LASER Right 06-24-2015   endovenous laser ablation right greater saphenous vein and stab phlebectomy 10-20 incisions   . NASAL SINUS SURGERY    . TEAR DUCT PROBING Right    x 2  . TEAR DUCT PROBING Left   . TRACHEOSTOMY CLOSURE     over 30 surgeries  . UPPER GASTROINTESTINAL ENDOSCOPY      FAMILY HISTORY:   Family History  Problem Relation Age of Onset  . Asthma Daughter   . Irritable bowel syndrome Daughter   . Irritable bowel syndrome Sister   . Other Sister        Chiropractor   . CAD Father   . Skin cancer Father   . Heart disease Father   . Osteoporosis Father   . COPD Father   . Prostate cancer Father   . Breast cancer Sister   . Irritable bowel syndrome Sister   . Ulcerative colitis Sister   . Hypertension Mother   . Osteoporosis Mother   . Breast cancer Mother 75       [facility]  . Asthma Sister   . Other Sister        PVC's  . Colon cancer Neg Hx   . Pancreatic cancer Neg Hx   . Stomach cancer Neg Hx   . Liver disease Neg Hx   . Esophageal cancer Neg Hx   . Rectal cancer Neg Hx   Theresa [name]'s father died in 22-Sep-2018.  Theresa [name]'s mother is alive in Theresa [name] late 72s.  Theresa [name] was diagnosed with what appears to be early stage breast cancer and 2020 Theresa [name] has 3 sisters.  There is no one else in Theresa family with autoimmune disease or with cancer.   GYNECOLOGIC HISTORY:   Theresa [name] is GX, P2, first pregnancy to term age 68.  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  Theresa [name] is currently on furlough.  Theresa [name] former husband, Hilliard Clark, works for Theresa [employer] for [employer], where he is Theresa [employer].  He is living in [place] and has remarried.  They finally completed their divorce agreement. Theresa [name]'s son  [name], age 30, is currently in [place] trying to find a job. Son [name], age 13, is currently a senior at [employer], and he is in transition (female to female).  Theresa [name] attends [employer] here in [place].   HEALTH MAINTENANCE: Social History   Tobacco Use  . Smoking status: Never Smoker  . Smokeless tobacco: Never Used  Substance Use Topics  . Alcohol use: Yes    Alcohol/week: 0.0 standard drinks    Comment: occasionally  . Drug use: No     Allergies  Allergen Reactions  . Codeine Itching  . Eletriptan Nausea Only  . Vancomycin Hives  .  Tape Rash   Current Outpatient Medications  Medication Sig Dispense Refill  . albuterol (PROVENTIL HFA;VENTOLIN HFA) 108 (90 BASE) MCG/ACT inhaler Inhale 2 puffs into Theresa lungs every 6 (six) hours as needed. For shortness of breath or wheezing    . denosumab (PROLIA) 60 MG/ML SOSY injection Inject 60 mg into Theresa skin every 6 (six) months.    . dexlansoprazole (DEXILANT) 60 MG capsule Take 1 capsule (60 mg total) by mouth 2 (two) times daily. 180 capsule 3  . fluticasone (FLONASE) 50 MCG/ACT nasal spray Place 2 sprays into Theresa nose daily.    . Hypertonic Nasal Wash (SINUS RINSE BOTTLE KIT NA) Place into Theresa nose. Adding Topamax and Budesomibe added to rinse    . Lactobacillus Rhamnosus, GG, (CULTURELLE PO) Take 1 capsule by mouth 2 (two) times a day.    Marland Kitchen LORazepam (ATIVAN) 1 MG tablet Take 1 tablet (1 mg total) by mouth 2 (two) times daily as needed for anxiety. ([name] taking differently: Take 2-3 mg by mouth at bedtime. ) 60 tablet 1  . Multiple Vitamin (MULTIVITAMIN) tablet Take 1 tablet by mouth daily.    . Multiple Vitamins-Minerals (PRESERVISION AREDS 2 PO) Take 1 tablet by mouth daily.     . mycophenolate (CELLCEPT) 500 MG tablet Take 1,000 mg by mouth 2 (two) times daily.    . predniSONE (DELTASONE) 5 MG tablet Take 10 mg by mouth daily with breakfast.     . sulfamethoxazole-trimethoprim (BACTRIM)  400-80 MG tablet Take 1 tablet by mouth 2 (two) times daily.    Marland Kitchen zolpidem (AMBIEN) 10 MG tablet Take one tablet hs prn.  Do not take with Ativan 30 tablet 1   No current facility-administered medications for this visit.    OBJECTIVE: Middle-aged white [name] who appears stated age  59:   10/30/19 0941  BP: 112/72  Pulse: 65  Resp: 18  Temp: 97.8 F (36.6 C)  SpO2: 99%    BMI: Body mass index is 26.84 kg/m.   ECOG FS: 1  Sclerae unicteric, EOMs intact Wearing a mask No cervical or supraclavicular adenopathy Lungs no rales or rhonchi Heart regular rate and rhythm Abd soft, nontender, positive bowel sounds MSK no focal spinal tenderness, no upper extremity lymphedema Neuro: nonfocal, well oriented, appropriate affect Breasts: Deferred  LAB RESULTS:    Chemistry      Component Value Date/Time   NA 140 10/30/2019 0920   NA 140 08/06/2017 1335   K 4.4 10/30/2019 0920   K 4.7 08/06/2017 1335   CL 107 10/30/2019 0920   CL 106 11/07/2012 0916   CO2 25 10/30/2019 0920   CO2 26 08/06/2017 1335   BUN 17 10/30/2019 0920   BUN 17.3 08/06/2017 1335   CREATININE 0.95 10/30/2019 0920   CREATININE 0.97 10/01/2019 0823   CREATININE 0.9 08/06/2017 1335      Component Value Date/Time   CALCIUM 8.8 (L) 10/30/2019 0920   CALCIUM 9.9 08/06/2017 1335   ALKPHOS 41 10/30/2019 0920   ALKPHOS 50 08/06/2017 1335   AST 18 10/30/2019 0920   AST 14 (L) 10/01/2019 0823   AST 18 08/06/2017 1335   ALT 18 10/30/2019 0920   ALT 14 10/01/2019 0823   ALT 20 08/06/2017 1335   BILITOT 0.4 10/30/2019 0920   BILITOT 0.3 10/01/2019 0823   BILITOT 0.40 08/06/2017 1335      Lab Results  Component Value Date   WBC 9.1 10/30/2019   HGB 13.2 10/30/2019   HCT 41.5 10/30/2019  MCV 93.0 10/30/2019   PLT 323 10/30/2019   NEUTROABS 6.9 10/30/2019    STUDIES: No results found.   ASSESSMENT: Theresa y.o.  Theresa [name] with a history of Wegener's granulomatosis initially diagnosed in 1999,  variously treated as noted above, s/p Rituxan x4 completed July 2010   (1) osteoporosis, treated on alendronate between 2002 and 2005, ibandronate between 2005 in 2008, then risedronate between 2008 and 2011, first dose of Reclast/zolendronate 10/24/2013  (a) DEXA scan 12/22/2012 lowest reading -2.8 at Theresa spine, -2.1 of Theresa femoral neck  (b) repeat bone density 2017 show worsening T score-- zoledronate discontinued 01/18/2015  (c) denosumab/Prolia started 05/02/2016   PLAN: [place] is struggling with Theresa [name] current Wegener's treatment.  Theresa [name] is being followed actively and closely by Dr. Chase Caller locally and Dr. [name].  If Theresa [name] current treatments does not work or is not well-tolerated Theresa [name] is thinking that Theresa [name] might need to go back on rituximab.  Theresa [name] understands that this would very much eliminate Theresa [name] chance of making antibodies to Theresa Covid virus so at Theresa very least Theresa [name] would want to wait until 2 to 3 weeks after Theresa [name] second dose of Theresa Pfizer vaccine.  However Theresa chance of Theresa [name] making significant antibodies is very low anyway given Theresa [name] current treatment and Theresa [name] should not assume that Theresa [name] is immune even after Theresa [name] receives Theresa vaccine.  Theresa [name] will need to continue to take active precautions to avoid getting infected  Theresa [name] is concerned about Theresa [name] weight.  We discussed diet issues.  I have encouraged Theresa [name] to use Theresa [name] new treadmill.  Hopefully Theresa [name] Wegener's situation will improve and Theresa [name] social situation stabilized now that Theresa [name] has reached a final agreement with Theresa [name] husband, even though that agreement does not sound all that favorable to Theresa [name].  Theresa [name] is having monthly labs here and already has a visit with me scheduled for June, with Theresa [name] next dose of Prolia  Theresa [name] knows to call for any other issue that may develop before then  Total encounter time 30 minutes.*  Deneene Tarver, Virgie Dad, MD  10/31/19 11:10 AM Medical Oncology and Hematology [facility] [place], [place] [address] Tel.  [phone]    Fax. [phone]    I, Wilburn Mylar, am acting as scribe for Dr. Virgie Dad. Elvera Almario.  I, Lurline Del MD, have reviewed Theresa above documentation for accuracy and completeness, and I agree with Theresa above.   *Total Encounter Time as defined by Theresa Centers for Medicare and Medicaid Services includes, in addition to Theresa face-to-face time of a [name] visit (documented in Theresa note above) non-face-to-face time: obtaining and reviewing outside history, ordering and reviewing medications, tests or procedures, care coordination (communications with other health care professionals or caregivers) and documentation in Theresa medical record.

## 2019-10-30 ENCOUNTER — Other Ambulatory Visit: Payer: Self-pay

## 2019-10-30 ENCOUNTER — Inpatient Hospital Stay (HOSPITAL_BASED_OUTPATIENT_CLINIC_OR_DEPARTMENT_OTHER): Payer: Medicare Other | Admitting: Oncology

## 2019-10-30 ENCOUNTER — Inpatient Hospital Stay: Payer: Medicare Other

## 2019-10-30 ENCOUNTER — Encounter: Payer: Self-pay | Admitting: Oncology

## 2019-10-30 VITALS — BP 112/72 | HR 65 | Temp 97.8°F | Resp 18 | Ht 66.0 in | Wt 166.3 lb

## 2019-10-30 DIAGNOSIS — M818 Other osteoporosis without current pathological fracture: Secondary | ICD-10-CM | POA: Diagnosis not present

## 2019-10-30 DIAGNOSIS — M3130 Wegener's granulomatosis without renal involvement: Secondary | ICD-10-CM

## 2019-10-30 LAB — CBC WITH DIFFERENTIAL/PLATELET
Abs Immature Granulocytes: 0.04 10*3/uL (ref 0.00–0.07)
Basophils Absolute: 0.1 10*3/uL (ref 0.0–0.1)
Basophils Relative: 1 %
Eosinophils Absolute: 0.1 10*3/uL (ref 0.0–0.5)
Eosinophils Relative: 1 %
HCT: 41.5 % (ref 36.0–46.0)
Hemoglobin: 13.2 g/dL (ref 12.0–15.0)
Immature Granulocytes: 0 %
Lymphocytes Relative: 14 %
Lymphs Abs: 1.3 10*3/uL (ref 0.7–4.0)
MCH: 29.6 pg (ref 26.0–34.0)
MCHC: 31.8 g/dL (ref 30.0–36.0)
MCV: 93 fL (ref 80.0–100.0)
Monocytes Absolute: 0.7 10*3/uL (ref 0.1–1.0)
Monocytes Relative: 8 %
Neutro Abs: 6.9 10*3/uL (ref 1.7–7.7)
Neutrophils Relative %: 76 %
Platelets: 323 10*3/uL (ref 150–400)
RBC: 4.46 MIL/uL (ref 3.87–5.11)
RDW: 13.2 % (ref 11.5–15.5)
WBC: 9.1 10*3/uL (ref 4.0–10.5)
nRBC: 0 % (ref 0.0–0.2)

## 2019-10-30 LAB — COMPREHENSIVE METABOLIC PANEL
ALT: 18 U/L (ref 0–44)
AST: 18 U/L (ref 15–41)
Albumin: 3.7 g/dL (ref 3.5–5.0)
Alkaline Phosphatase: 41 U/L (ref 38–126)
Anion gap: 8 (ref 5–15)
BUN: 17 mg/dL (ref 6–20)
CO2: 25 mmol/L (ref 22–32)
Calcium: 8.8 mg/dL — ABNORMAL LOW (ref 8.9–10.3)
Chloride: 107 mmol/L (ref 98–111)
Creatinine, Ser: 0.95 mg/dL (ref 0.44–1.00)
GFR calc Af Amer: >60 mL/min (ref >60)
GFR calc non Af Amer: >60 mL/min (ref >60)
Glucose, Bld: 87 mg/dL (ref 70–99)
Potassium: 4.4 mmol/L (ref 3.5–5.1)
Sodium: 140 mmol/L (ref 135–145)
Total Bilirubin: 0.4 mg/dL (ref 0.3–1.2)
Total Protein: 6.6 g/dL (ref 6.5–8.1)

## 2019-10-31 ENCOUNTER — Encounter: Payer: Self-pay | Admitting: Oncology

## 2019-10-31 LAB — MYCOPHENOLIC ACID (CELLCEPT)
MPA Glucuronide: 43 ug/mL (ref 15–125)
MPA: 6 ug/mL — ABNORMAL HIGH (ref 1.0–3.5)

## 2019-11-03 ENCOUNTER — Encounter: Payer: Self-pay | Admitting: Oncology

## 2019-11-03 ENCOUNTER — Other Ambulatory Visit: Payer: Self-pay

## 2019-11-03 MED ORDER — GABAPENTIN 100 MG PO CAPS: 100.0000 mg | ORAL_CAPSULE | Freq: Every day | ORAL | 2 refills | Status: DC

## 2019-11-03 NOTE — Progress Notes (Signed)
Rx sent to pharmacy for Gabapentin, per MD verbal orders. Pt aware.

## 2019-11-06 ENCOUNTER — Inpatient Hospital Stay: Payer: Medicare Other | Attending: Oncology

## 2019-11-06 ENCOUNTER — Other Ambulatory Visit: Payer: Self-pay

## 2019-11-06 DIAGNOSIS — M3130 Wegener's granulomatosis without renal involvement: Secondary | ICD-10-CM

## 2019-11-06 LAB — COMPREHENSIVE METABOLIC PANEL
ALT: 16 U/L (ref 0–44)
AST: 17 U/L (ref 15–41)
Albumin: 3.8 g/dL (ref 3.5–5.0)
Alkaline Phosphatase: 41 U/L (ref 38–126)
Anion gap: 8 (ref 5–15)
BUN: 14 mg/dL (ref 6–20)
CO2: 26 mmol/L (ref 22–32)
Calcium: 9 mg/dL (ref 8.9–10.3)
Chloride: 105 mmol/L (ref 98–111)
Creatinine, Ser: 0.98 mg/dL (ref 0.44–1.00)
GFR calc Af Amer: >60 mL/min (ref >60)
GFR calc non Af Amer: >60 mL/min (ref >60)
Glucose, Bld: 75 mg/dL (ref 70–99)
Potassium: 4.5 mmol/L (ref 3.5–5.1)
Sodium: 139 mmol/L (ref 135–145)
Total Bilirubin: 0.4 mg/dL (ref 0.3–1.2)
Total Protein: 6.5 g/dL (ref 6.5–8.1)

## 2019-11-06 LAB — CBC WITH DIFFERENTIAL/PLATELET
Abs Immature Granulocytes: 0.04 10*3/uL (ref 0.00–0.07)
Basophils Absolute: 0.1 10*3/uL (ref 0.0–0.1)
Basophils Relative: 1 %
Eosinophils Absolute: 0 10*3/uL (ref 0.0–0.5)
Eosinophils Relative: 0 %
HCT: 41.1 % (ref 36.0–46.0)
Hemoglobin: 13.1 g/dL (ref 12.0–15.0)
Immature Granulocytes: 0 %
Lymphocytes Relative: 12 %
Lymphs Abs: 1.2 10*3/uL (ref 0.7–4.0)
MCH: 29.8 pg (ref 26.0–34.0)
MCHC: 31.9 g/dL (ref 30.0–36.0)
MCV: 93.6 fL (ref 80.0–100.0)
Monocytes Absolute: 0.6 10*3/uL (ref 0.1–1.0)
Monocytes Relative: 6 %
Neutro Abs: 8.3 10*3/uL — ABNORMAL HIGH (ref 1.7–7.7)
Neutrophils Relative %: 81 %
Platelets: 305 10*3/uL (ref 150–400)
RBC: 4.39 MIL/uL (ref 3.87–5.11)
RDW: 13.2 % (ref 11.5–15.5)
WBC: 10.3 10*3/uL (ref 4.0–10.5)
nRBC: 0 % (ref 0.0–0.2)

## 2019-11-07 LAB — MYCOPHENOLIC ACID (CELLCEPT)
MPA Glucuronide: 60 ug/mL (ref 15–125)
MPA: 3.8 ug/mL — ABNORMAL HIGH (ref 1.0–3.5)

## 2019-11-11 ENCOUNTER — Other Ambulatory Visit: Payer: Self-pay

## 2019-11-11 ENCOUNTER — Ambulatory Visit
Admission: RE | Admit: 2019-11-11 | Discharge: 2019-11-11 | Disposition: A | Discharge status: Home / Self Care | Payer: Medicare Other | Source: Ambulatory Visit | Attending: Oncology | Admitting: Oncology

## 2019-11-11 DIAGNOSIS — M3130 Wegener's granulomatosis without renal involvement: Secondary | ICD-10-CM

## 2019-11-11 DIAGNOSIS — M818 Other osteoporosis without current pathological fracture: Secondary | ICD-10-CM

## 2019-11-12 ENCOUNTER — Other Ambulatory Visit: Payer: Medicare Other

## 2019-12-03 ENCOUNTER — Encounter: Payer: Self-pay | Admitting: Oncology

## 2019-12-08 ENCOUNTER — Inpatient Hospital Stay: Payer: Medicare Other | Attending: Oncology

## 2019-12-08 ENCOUNTER — Other Ambulatory Visit: Payer: Self-pay

## 2019-12-08 ENCOUNTER — Encounter: Payer: Self-pay | Admitting: General Practice

## 2019-12-08 ENCOUNTER — Other Ambulatory Visit: Payer: Self-pay | Admitting: *Deleted

## 2019-12-08 DIAGNOSIS — M3130 Wegener's granulomatosis without renal involvement: Secondary | ICD-10-CM | POA: Diagnosis not present

## 2019-12-08 LAB — COMPREHENSIVE METABOLIC PANEL
ALT: 17 U/L (ref 0–44)
AST: 18 U/L (ref 15–41)
Albumin: 3.9 g/dL (ref 3.5–5.0)
Alkaline Phosphatase: 37 U/L — ABNORMAL LOW (ref 38–126)
Anion gap: 11 (ref 5–15)
BUN: 16 mg/dL (ref 6–20)
CO2: 25 mmol/L (ref 22–32)
Calcium: 9.4 mg/dL (ref 8.9–10.3)
Chloride: 106 mmol/L (ref 98–111)
Creatinine, Ser: 0.92 mg/dL (ref 0.44–1.00)
GFR calc Af Amer: >60 mL/min (ref >60)
GFR calc non Af Amer: >60 mL/min (ref >60)
Glucose, Bld: 79 mg/dL (ref 70–99)
Potassium: 4.3 mmol/L (ref 3.5–5.1)
Sodium: 142 mmol/L (ref 135–145)
Total Bilirubin: 0.4 mg/dL (ref 0.3–1.2)
Total Protein: 7 g/dL (ref 6.5–8.1)

## 2019-12-08 LAB — CBC WITH DIFFERENTIAL/PLATELET
Abs Immature Granulocytes: 0.05 10*3/uL (ref 0.00–0.07)
Basophils Absolute: 0.1 10*3/uL (ref 0.0–0.1)
Basophils Relative: 1 %
Eosinophils Absolute: 0.1 10*3/uL (ref 0.0–0.5)
Eosinophils Relative: 1 %
HCT: 43.5 % (ref 36.0–46.0)
Hemoglobin: 13.5 g/dL (ref 12.0–15.0)
Immature Granulocytes: 1 %
Lymphocytes Relative: 14 %
Lymphs Abs: 1.5 10*3/uL (ref 0.7–4.0)
MCH: 29.2 pg (ref 26.0–34.0)
MCHC: 31 g/dL (ref 30.0–36.0)
MCV: 94 fL (ref 80.0–100.0)
Monocytes Absolute: 0.7 10*3/uL (ref 0.1–1.0)
Monocytes Relative: 7 %
Neutro Abs: 7.8 10*3/uL — ABNORMAL HIGH (ref 1.7–7.7)
Neutrophils Relative %: 76 %
Platelets: 330 10*3/uL (ref 150–400)
RBC: 4.63 MIL/uL (ref 3.87–5.11)
RDW: 13.2 % (ref 11.5–15.5)
WBC: 10.2 10*3/uL (ref 4.0–10.5)
nRBC: 0 % (ref 0.0–0.2)

## 2019-12-08 NOTE — Progress Notes (Signed)
[place] Spiritual Care Note  Met with Theresa [name] per her request, providing empathic listening and pastoral reflection as she shared and processed personal updates and discernment related to coping with treatment. At the close of appointment, she noted feeling relief at giving voice to the myriad layers and questions she's juggling. We plan for me to follow by phone this week per her request for psychologist referral resources and for her to call again once she gains clarity about the timing of her treatment plan.   [place], [place], [facility] Pager [phone] Voicemail [phone]

## 2019-12-08 NOTE — Progress Notes (Signed)
[place] Spiritual Care Note  Had quick follow-up call from [facility], reporting that she received normalizing, validating confirmation from her pharmacist regarding likely side effects she is experiencing. Affirmed her initiative in seeking more information from several layers of her medical team. Will contact her later in the week with support suggestions as planned.   [place], [place], [facility] Pager [phone] Voicemail [phone]

## 2019-12-09 ENCOUNTER — Encounter: Payer: Self-pay | Admitting: General Practice

## 2019-12-09 LAB — MYCOPHENOLIC ACID (CELLCEPT)
MPA Glucuronide: 36 ug/mL (ref 15–125)
MPA: 5.3 ug/mL — ABNORMAL HIGH (ref 1.0–3.5)

## 2019-12-09 NOTE — Progress Notes (Signed)
[place] Spiritual Care Note  Left voicemail at Verleen's encouragement with information about how to explore which providers take her specific insurance at [facility] or [facility] outpatient behavioral [facility].    [place], [place], [facility] Pager [phone] Voicemail [phone]

## 2020-01-07 ENCOUNTER — Encounter: Payer: Self-pay | Admitting: Oncology

## 2020-01-07 ENCOUNTER — Inpatient Hospital Stay: Payer: Medicare Other

## 2020-01-13 ENCOUNTER — Inpatient Hospital Stay: Payer: Medicare Other | Attending: Oncology

## 2020-01-13 ENCOUNTER — Other Ambulatory Visit: Payer: Self-pay

## 2020-01-13 DIAGNOSIS — M3130 Wegener's granulomatosis without renal involvement: Secondary | ICD-10-CM | POA: Diagnosis present

## 2020-01-13 LAB — CBC WITH DIFFERENTIAL/PLATELET
Abs Immature Granulocytes: 0.02 10*3/uL (ref 0.00–0.07)
Basophils Absolute: 0.1 10*3/uL (ref 0.0–0.1)
Basophils Relative: 1 %
Eosinophils Absolute: 0.1 10*3/uL (ref 0.0–0.5)
Eosinophils Relative: 2 %
HCT: 43.6 % (ref 36.0–46.0)
Hemoglobin: 13.6 g/dL (ref 12.0–15.0)
Immature Granulocytes: 0 %
Lymphocytes Relative: 30 %
Lymphs Abs: 2 10*3/uL (ref 0.7–4.0)
MCH: 29.6 pg (ref 26.0–34.0)
MCHC: 31.2 g/dL (ref 30.0–36.0)
MCV: 94.8 fL (ref 80.0–100.0)
Monocytes Absolute: 0.7 10*3/uL (ref 0.1–1.0)
Monocytes Relative: 11 %
Neutro Abs: 3.7 10*3/uL (ref 1.7–7.7)
Neutrophils Relative %: 56 %
Platelets: 316 10*3/uL (ref 150–400)
RBC: 4.6 MIL/uL (ref 3.87–5.11)
RDW: 13.7 % (ref 11.5–15.5)
WBC: 6.7 10*3/uL (ref 4.0–10.5)
nRBC: 0 % (ref 0.0–0.2)

## 2020-01-13 LAB — COMPREHENSIVE METABOLIC PANEL
ALT: 15 U/L (ref 0–44)
AST: 14 U/L — ABNORMAL LOW (ref 15–41)
Albumin: 3.6 g/dL (ref 3.5–5.0)
Alkaline Phosphatase: 41 U/L (ref 38–126)
Anion gap: 8 (ref 5–15)
BUN: 14 mg/dL (ref 6–20)
CO2: 26 mmol/L (ref 22–32)
Calcium: 9.1 mg/dL (ref 8.9–10.3)
Chloride: 108 mmol/L (ref 98–111)
Creatinine, Ser: 0.87 mg/dL (ref 0.44–1.00)
GFR calc Af Amer: >60 mL/min (ref >60)
GFR calc non Af Amer: >60 mL/min (ref >60)
Glucose, Bld: 65 mg/dL — ABNORMAL LOW (ref 70–99)
Potassium: 4.5 mmol/L (ref 3.5–5.1)
Sodium: 142 mmol/L (ref 135–145)
Total Bilirubin: 0.4 mg/dL (ref 0.3–1.2)
Total Protein: 6.5 g/dL (ref 6.5–8.1)

## 2020-01-14 LAB — MYCOPHENOLIC ACID (CELLCEPT)
MPA Glucuronide: 14 ug/mL — ABNORMAL LOW (ref 15–125)
MPA: 2.6 ug/mL (ref 1.0–3.5)

## 2020-01-22 ENCOUNTER — Encounter: Payer: Self-pay | Admitting: Oncology

## 2020-02-03 ENCOUNTER — Other Ambulatory Visit: Payer: Self-pay | Admitting: *Deleted

## 2020-02-03 DIAGNOSIS — R002 Palpitations: Secondary | ICD-10-CM

## 2020-02-03 DIAGNOSIS — M3130 Wegener's granulomatosis without renal involvement: Secondary | ICD-10-CM

## 2020-02-03 DIAGNOSIS — R5383 Other fatigue: Secondary | ICD-10-CM

## 2020-02-04 NOTE — Progress Notes (Signed)
ID: [name]  DOB: 08/29/1961  MR#: [id number]  CSN#: [id number]  Patient Care Team: Antony Contras, MD as PCP - General (Family Medicine) Brand Males, MD as Consulting Physician (Pulmonary Disease) Delrae Rend, MD as Consulting Physician (Endocrinology) Kytzia Gienger, Virgie Dad, MD as Consulting Physician (Oncology) Ernestine Conrad, MD as Referring Physician (Otolaryngology) Clinger, Chrystie Nose, MD (Otolaryngology) Pedro Earls, MD as Attending Physician (Family Medicine) Don Broach, MD as Referring Physician (Internal Medicine) Pyrtle, Lajuan Lines, MD as Consulting Physician (Gastroenterology) Lanice Schwab, DO as Referring Physician (Rheumatology) OTHER MD: [name] (fax [phone]); Jaquita Rector; Danton Sewer; Kandis Ban; Huey Romans  CHIEF COMPLAINT: Wegener's granulomatosis  CURRENT TREATMENT: per doctors Specks and Ramaswamy   INTERVAL HISTORY:   Aleea returns today for 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  She continues to be followed through the [facility] under Dr [name].  She also sees Dr. Chase Caller here as a pulmonologist and Dr. Hilarie Fredrickson as her GI doctor  Dr. Mariane Baumgarten started her on Bactrim daily beginning 07/07/2019 for pneumocystis prevention, and prednisone on a taper, currently at 5 mg daily.  She also uses a tobramycin and budesonide nasal wash.  She was started on CellCept on 08/11/2019, currently on a 1000 mg in the a.m. and 500 mg in the evening.  Meanwhile she continues on Prolia with good tolerance.  Her most recent dose was 07/17/2019.  She has a dose due today.  Since her last visit, she underwent bone density screening on 11/11/2019 showing a T-score of -2.5, which is considered osteoporotic.  She is scheduled for screening mammogram on 02/09/2020.   REVIEW OF SYSTEMS:  Emerson has been feeling more anxious lately.  She is not sure of the reason.  She wonders if the mycophenolate is related to that.  She does have some GI  issues.  She has some palpitations.  She has been referred to Dr. Tamala Julian but does not have an appointment until July.  In addition she feels foggy brain at times.  What helps is exercise and recently she went on the track and did some running on walking and that felt much better her son [name] is currently living with her but planning to move to [place] soon to try to get a job.  Melania has had both her Pfizer vaccine doses and her first Shingrix dose.  Aside from these issues a detailed review of systems today was stable   HISTORY OF WEGENER'S GRANULOMATOSIS:  From the original intake note:  [name] tells me her diagnosis of Wegener was initially made at the [facility] by Dr. [name] in 1999.  She has been treated in the past with Cytoxan and prednisone, methotrexate very briefly, Cytoxan orally, then Imuran for some time, and then off treatment for some years.  She was started on prednisone alone in 2009, and then in 06/2008 the azathioprine was added again at 150 mg daily, and variable doses of prednisone.  It is felt that she failed remission maintenance therapy since she could not reduce her dose of prednisone below 20 mg daily without having a resurgence of symptoms, and therefore she has been started on Rituxan with the first dose given at the [facility] on 06/24.  The patient tolerated the treatment quite well.    Her subsequent history is as detailed below   PAST MEDICAL HISTORY: Past Medical History:  Diagnosis Date  . Allergy   . Anxiety   . Arthritis   . Calyceal diverticulum of kidney   .  Cholelithiasis   . Diverticulosis   . Gastritis   . GERD (gastroesophageal reflux disease)   . Headaches, cluster   . Heart murmur   . Hx MRSA infection   . Mitral valve prolapse   . Osteoporosis   . Thyroid disease    Thyroid nodules  . Tinnitus   . Varicose veins   . Wegener's granulomatosis ([place]) 1998   autoimmune vasculitits  history of migraines   SURGICAL HISTORY: Past  Surgical History:  Procedure Laterality Date  . AIRWAY FOREIGN BODY REMOVAL     with tracheostomy  . COLONOSCOPY    . ENDOVENOUS ABLATION SAPHENOUS VEIN W/ LASER Left 06-03-2015   endovenous laser ablation left greater saphenous vein and stab phlebectomy 10-20 incisions left leg by Curt Jews MD  . ENDOVENOUS ABLATION SAPHENOUS VEIN W/ LASER Right 06-24-2015   endovenous laser ablation right greater saphenous vein and stab phlebectomy 10-20 incisions   . NASAL SINUS SURGERY    . TEAR DUCT PROBING Right    x 2  . TEAR DUCT PROBING Left   . TRACHEOSTOMY CLOSURE     over 30 surgeries  . UPPER GASTROINTESTINAL ENDOSCOPY      FAMILY HISTORY:   Family History  Problem Relation Age of Onset  . Asthma Daughter   . Irritable bowel syndrome Daughter   . Irritable bowel syndrome Sister   . Other Sister        Chiropractor   . CAD Father   . Skin cancer Father   . Heart disease Father   . Osteoporosis Father   . COPD Father   . Prostate cancer Father   . Breast cancer Sister   . Irritable bowel syndrome Sister   . Ulcerative colitis Sister   . Hypertension Mother   . Osteoporosis Mother   . Breast cancer Mother 101       IDC  . Asthma Sister   . Other Sister        PVC's  . Colon cancer Neg Hx   . Pancreatic cancer Neg Hx   . Stomach cancer Neg Hx   . Liver disease Neg Hx   . Esophageal cancer Neg Hx   . Rectal cancer Neg Hx   The patient's father died in 10-10-2018.  The patient's mother is alive in her late 39s.  She was diagnosed with what appears to be early stage breast cancer and 2020 the patient has 3 sisters.  There is no one else in the family with autoimmune disease or with cancer.   GYNECOLOGIC HISTORY:   She is GX, P2, first pregnancy to term age 52.  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  She is currently on furlough.  Her former husband, Hilliard Clark, works for the [employer] for [employer], where he is the [employer].  He is living in [place] and has remarried.  They finally completed their divorce agreement. The patient's son [name], age 69, is currently in [place] trying to find a job. Son [name], age 44, graduated from [place] [employer] studying exercise science, and is thinking of moving to [place] to find a job; he is in transition (female to female).  The patient attends [employer] here in [place].   HEALTH MAINTENANCE: Social History   Tobacco Use  . Smoking  status: Never Smoker  . Smokeless tobacco: Never Used  Substance Use Topics  . Alcohol use: Yes    Alcohol/week: 0.0 standard drinks    Comment: occasionally  . Drug use: No     Allergies  Allergen Reactions  . Codeine Itching  . Eletriptan Nausea Only  . Vancomycin Hives  . Tape Rash   Current Outpatient Medications  Medication Sig Dispense Refill  . albuterol (PROVENTIL HFA;VENTOLIN HFA) 108 (90 BASE) MCG/ACT inhaler Inhale 2 puffs into the lungs every 6 (six) hours as needed. For shortness of breath or wheezing    . denosumab (PROLIA) 60 MG/ML SOSY injection Inject 60 mg into the skin every 6 (six) months.    . dexlansoprazole (DEXILANT) 60 MG capsule Take 1 capsule (60 mg total) by mouth 2 (two) times daily. 180 capsule 3  . fluticasone (FLONASE) 50 MCG/ACT nasal spray Place 2 sprays into the nose daily.    Marland Kitchen gabapentin (NEURONTIN) 100 MG capsule Take 1 capsule (100 mg total) by mouth at bedtime. 30 capsule 2  . Hypertonic Nasal Wash (SINUS RINSE BOTTLE KIT NA) Place into the nose. Adding Topamax and Budesomibe added to rinse    . Lactobacillus Rhamnosus, GG, (CULTURELLE PO) Take 1 capsule by mouth 2 (two) times a day.    Marland Kitchen LORazepam (ATIVAN) 1 MG tablet Take 1 tablet (1 mg total) by mouth 2 (two) times daily as needed for anxiety. (Patient taking differently: Take 2-3 mg by  mouth at bedtime. ) 60 tablet 1  . Multiple Vitamin (MULTIVITAMIN) tablet Take 1 tablet by mouth daily.    . Multiple Vitamins-Minerals (PRESERVISION AREDS 2 PO) Take 1 tablet by mouth daily.     . mycophenolate (CELLCEPT) 500 MG tablet Take 1,000 mg by mouth 2 (two) times daily.    . predniSONE (DELTASONE) 5 MG tablet Take 10 mg by mouth daily with breakfast.     . sulfamethoxazole-trimethoprim (BACTRIM) 400-80 MG tablet Take 1 tablet by mouth 2 (two) times daily.    Marland Kitchen zolpidem (AMBIEN) 10 MG tablet Take one tablet hs prn.  Do not take with Ativan 30 tablet 1   No current facility-administered medications for this visit.    OBJECTIVE: white woman in no acute distress  Vitals:   02/05/20 0934  BP: 111/67  Pulse: 70  Resp: 17  Temp: 98.9 F (37.2 C)  SpO2: 100%    BMI: Body mass index is 25.76 kg/m.   ECOG FS: 1  Sclerae unicteric, EOMs intact Wearing a mask No cervical or supraclavicular adenopathy; no carotid bruits noted Lungs no rales or rhonchi Heart regular rate and rhythm Abd soft, nontender, positive bowel sounds MSK no focal spinal tenderness, no upper extremity lymphedema Neuro: nonfocal, well oriented, appropriate affect Breasts: Deferred   LAB RESULTS:    Chemistry      Component Value Date/Time   NA 142 01/13/2020 1006   NA 140 08/06/2017 1335   K 4.5 01/13/2020 1006   K 4.7 08/06/2017 1335   CL 108 01/13/2020 1006   CL 106 11/07/2012 0916   CO2 26 01/13/2020 1006   CO2 26 08/06/2017 1335   BUN 14 01/13/2020 1006   BUN 17.3 08/06/2017 1335   CREATININE 0.87 01/13/2020 1006   CREATININE 0.97 10/01/2019 0823   CREATININE 0.9 08/06/2017 1335      Component Value Date/Time   CALCIUM 9.1 01/13/2020 1006   CALCIUM 9.9 08/06/2017 1335   ALKPHOS 41 01/13/2020 1006   ALKPHOS 50 08/06/2017 1335  AST 14 (L) 01/13/2020 1006   AST 14 (L) 10/01/2019 0823   AST 18 08/06/2017 1335   ALT 15 01/13/2020 1006   ALT 14 10/01/2019 0823   ALT 20 08/06/2017 1335    BILITOT 0.4 01/13/2020 1006   BILITOT 0.3 10/01/2019 0823   BILITOT 0.40 08/06/2017 1335      Lab Results  Component Value Date   WBC 7.8 02/05/2020   HGB 13.7 02/05/2020   HCT 43.6 02/05/2020   MCV 91.8 02/05/2020   PLT 338 02/05/2020   NEUTROABS 4.1 02/05/2020    STUDIES: No results found.   ASSESSMENT: 59 y.o.  [place] woman with a history of Wegener's granulomatosis initially diagnosed in 1999, variously treated as noted above, s/p Rituxan x4 completed July 2010   (1) osteoporosis, treated on alendronate between 2002 and 2005, ibandronate between 2005 in 2008, then risedronate between 2008 and 2011, first dose of Reclast/zolendronate 10/24/2013  (a) DEXA scan 12/22/2012 lowest reading -2.8 at the spine, -2.1 of the femoral neck  (b) repeat bone density 2017 show worsening T score-- zoledronate discontinued 01/18/2015  (c) denosumab/Prolia started 05/02/2016   PLAN: Delaynie is hoping to be able to get off the mycophenolate at some point in the next few months.  In the meantime she has a very good functional status.  I am not sure why she is feeling more anxious.  Some of it may be situational some of it may be medication related.  She understands that I am not an expert at the type of medications that she is receiving and she is under the care of Dr. Chase Caller and Dr. Claire Shown with regards to that.  She will receive Prolia today and in 6 months.  She will repeated 12 months from now and she will see me again at that time.  She is also scheduled for mammography next week  She knows to call for any other issue that may develop before the next visit  Total encounter time 25 minutes.*  Goldia Ligman, Virgie Dad, MD  02/05/20 9:55 AM Medical Oncology and Hematology [facility] [place], [place] [address] Tel. [phone]    Fax. [phone]    I, Wilburn Mylar, am acting as scribe for Dr. Virgie Dad. Marcelo Ickes.  I, Lurline Del MD, have  reviewed the above documentation for accuracy and completeness, and I agree with the above.   *Total Encounter Time as defined by the Centers for Medicare and Medicaid Services includes, in addition to the face-to-face time of a patient visit (documented in the note above) non-face-to-face time: obtaining and reviewing outside history, ordering and reviewing medications, tests or procedures, care coordination (communications with other health care professionals or caregivers) and documentation in the medical record.

## 2020-02-05 ENCOUNTER — Other Ambulatory Visit: Payer: Self-pay

## 2020-02-05 ENCOUNTER — Inpatient Hospital Stay: Payer: Medicare Other | Attending: Oncology

## 2020-02-05 ENCOUNTER — Inpatient Hospital Stay: Payer: Medicare Other | Admitting: Oncology

## 2020-02-05 ENCOUNTER — Encounter: Payer: Self-pay | Admitting: Oncology

## 2020-02-05 ENCOUNTER — Inpatient Hospital Stay: Payer: Medicare Other

## 2020-02-05 VITALS — BP 111/67 | HR 70 | Temp 98.9°F | Resp 17 | Ht 66.0 in | Wt 159.6 lb

## 2020-02-05 DIAGNOSIS — M3130 Wegener's granulomatosis without renal involvement: Secondary | ICD-10-CM

## 2020-02-05 DIAGNOSIS — M810 Age-related osteoporosis without current pathological fracture: Secondary | ICD-10-CM

## 2020-02-05 DIAGNOSIS — M818 Other osteoporosis without current pathological fracture: Secondary | ICD-10-CM

## 2020-02-05 LAB — COMPREHENSIVE METABOLIC PANEL
ALT: 15 U/L (ref 0–44)
AST: 17 U/L (ref 15–41)
Albumin: 3.8 g/dL (ref 3.5–5.0)
Alkaline Phosphatase: 40 U/L (ref 38–126)
Anion gap: 10 (ref 5–15)
BUN: 16 mg/dL (ref 6–20)
CO2: 25 mmol/L (ref 22–32)
Calcium: 9.3 mg/dL (ref 8.9–10.3)
Chloride: 107 mmol/L (ref 98–111)
Creatinine, Ser: 1.05 mg/dL — ABNORMAL HIGH (ref 0.44–1.00)
GFR calc Af Amer: >60 mL/min (ref >60)
GFR calc non Af Amer: 58 mL/min — ABNORMAL LOW (ref >60)
Glucose, Bld: 68 mg/dL — ABNORMAL LOW (ref 70–99)
Potassium: 4 mmol/L (ref 3.5–5.1)
Sodium: 142 mmol/L (ref 135–145)
Total Bilirubin: 0.4 mg/dL (ref 0.3–1.2)
Total Protein: 6.8 g/dL (ref 6.5–8.1)

## 2020-02-05 LAB — CBC WITH DIFFERENTIAL/PLATELET
Abs Immature Granulocytes: 0.04 10*3/uL (ref 0.00–0.07)
Basophils Absolute: 0.1 10*3/uL (ref 0.0–0.1)
Basophils Relative: 1 %
Eosinophils Absolute: 0.1 10*3/uL (ref 0.0–0.5)
Eosinophils Relative: 2 %
HCT: 43.6 % (ref 36.0–46.0)
Hemoglobin: 13.7 g/dL (ref 12.0–15.0)
Immature Granulocytes: 1 %
Lymphocytes Relative: 33 %
Lymphs Abs: 2.6 10*3/uL (ref 0.7–4.0)
MCH: 28.8 pg (ref 26.0–34.0)
MCHC: 31.4 g/dL (ref 30.0–36.0)
MCV: 91.8 fL (ref 80.0–100.0)
Monocytes Absolute: 0.9 10*3/uL (ref 0.1–1.0)
Monocytes Relative: 11 %
Neutro Abs: 4.1 10*3/uL (ref 1.7–7.7)
Neutrophils Relative %: 52 %
Platelets: 338 10*3/uL (ref 150–400)
RBC: 4.75 MIL/uL (ref 3.87–5.11)
RDW: 13.3 % (ref 11.5–15.5)
WBC: 7.8 10*3/uL (ref 4.0–10.5)
nRBC: 0 % (ref 0.0–0.2)

## 2020-02-05 MED ORDER — DENOSUMAB 60 MG/ML ~~LOC~~ SOSY
60.0000 mg | PREFILLED_SYRINGE | Freq: Once | SUBCUTANEOUS | Status: AC
  Administered 2020-02-05: 60 mg via SUBCUTANEOUS

## 2020-02-05 MED ORDER — DENOSUMAB 60 MG/ML ~~LOC~~ SOSY
PREFILLED_SYRINGE | SUBCUTANEOUS | Status: AC
  Filled 2020-02-05: qty 1

## 2020-02-05 NOTE — Patient Instructions (Signed)
Denosumab injection °What is this medicine? °DENOSUMAB (den oh sue mab) slows bone breakdown. Prolia is used to treat osteoporosis in women after menopause and in men, and in people who are taking corticosteroids for 6 months or more. Xgeva is used to treat a high calcium level due to cancer and to prevent bone fractures and other bone problems caused by multiple myeloma or cancer bone metastases. Xgeva is also used to treat giant cell tumor of the bone. °This medicine may be used for other purposes; ask your health care provider or pharmacist if you have questions. °COMMON BRAND NAME(S): Prolia, XGEVA °What should I tell my health care provider before I take this medicine? °They need to know if you have any of these conditions: °· dental disease °· having surgery or tooth extraction °· infection °· kidney disease °· low levels of calcium or Vitamin D in the blood °· malnutrition °· on hemodialysis °· skin conditions or sensitivity °· thyroid or parathyroid disease °· an unusual reaction to denosumab, other medicines, foods, dyes, or preservatives °· pregnant or trying to get pregnant °· breast-feeding °How should I use this medicine? °This medicine is for injection under the skin. It is given by a health care professional in a hospital or clinic setting. °A special MedGuide will be given to you before each treatment. Be sure to read this information carefully each time. °For Prolia, talk to your pediatrician regarding the use of this medicine in children. Special care may be needed. For Xgeva, talk to your pediatrician regarding the use of this medicine in children. While this drug may be prescribed for children as young as 13 years for selected conditions, precautions do apply. °Overdosage: If you think you have taken too much of this medicine contact a poison control center or emergency room at once. °NOTE: This medicine is only for you. Do not share this medicine with others. °What if I miss a dose? °It is  important not to miss your dose. Call your doctor or health care professional if you are unable to keep an appointment. °What may interact with this medicine? °Do not take this medicine with any of the following medications: °· other medicines containing denosumab °This medicine may also interact with the following medications: °· medicines that lower your chance of fighting infection °· steroid medicines like prednisone or cortisone °This list may not describe all possible interactions. Give your health care provider a list of all the medicines, herbs, non-prescription drugs, or dietary supplements you use. Also tell them if you smoke, drink alcohol, or use illegal drugs. Some items may interact with your medicine. °What should I watch for while using this medicine? °Visit your doctor or health care professional for regular checks on your progress. Your doctor or health care professional may order blood tests and other tests to see how you are doing. °Call your doctor or health care professional for advice if you get a fever, chills or sore throat, or other symptoms of a cold or flu. Do not treat yourself. This drug may decrease your body's ability to fight infection. Try to avoid being around people who are sick. °You should make sure you get enough calcium and vitamin D while you are taking this medicine, unless your doctor tells you not to. Discuss the foods you eat and the vitamins you take with your health care professional. °See your dentist regularly. Brush and floss your teeth as directed. Before you have any dental work done, tell your dentist you are   receiving this medicine. Do not become pregnant while taking this medicine or for 5 months after stopping it. Talk with your doctor or health care professional about your birth control options while taking this medicine. Women should inform their doctor if they wish to become pregnant or think they might be pregnant. There is a potential for serious side  effects to an unborn child. Talk to your health care professional or pharmacist for more information. What side effects may I notice from receiving this medicine? Side effects that you should report to your doctor or health care professional as soon as possible:  allergic reactions like skin rash, itching or hives, swelling of the face, lips, or tongue  bone pain  breathing problems  dizziness  jaw pain, especially after dental work  redness, blistering, peeling of the skin  signs and symptoms of infection like fever or chills; cough; sore throat; pain or trouble passing urine  signs of low calcium like fast heartbeat, muscle cramps or muscle pain; pain, tingling, numbness in the hands or feet; seizures  unusual bleeding or bruising  unusually weak or tired Side effects that usually do not require medical attention (report to your doctor or health care professional if they continue or are bothersome):  constipation  diarrhea  headache  joint pain  loss of appetite  muscle pain  runny nose  tiredness  upset stomach This list may not describe all possible side effects. Call your doctor for medical advice about side effects. You may report side effects to FDA at 1-800-FDA-1088. Where should I keep my medicine? This medicine is only given in a clinic, doctor's office, or other health care setting and will not be stored at home. NOTE: This sheet is a summary. It may not cover all possible information. If you have questions about this medicine, talk to your doctor, pharmacist, or health care provider.  2020 Elsevier/Gold Standard (2017-12-28 16:10:44)

## 2020-02-06 ENCOUNTER — Telehealth: Payer: Self-pay | Admitting: Oncology

## 2020-02-06 ENCOUNTER — Other Ambulatory Visit: Payer: Medicare Other

## 2020-02-06 LAB — MYCOPHENOLIC ACID (CELLCEPT)
MPA Glucuronide: 14 ug/mL — ABNORMAL LOW (ref 15–125)
MPA: 1.9 ug/mL (ref 1.0–3.5)

## 2020-02-06 NOTE — Telephone Encounter (Signed)
Scheduled appts per 6/3 los. Pt confirmed appt date and time.

## 2020-02-09 ENCOUNTER — Ambulatory Visit: Payer: Medicare Other

## 2020-02-17 ENCOUNTER — Other Ambulatory Visit: Payer: Self-pay

## 2020-02-17 ENCOUNTER — Ambulatory Visit
Admission: RE | Admit: 2020-02-17 | Discharge: 2020-02-17 | Disposition: A | Discharge status: Home / Self Care | Payer: Medicare Other | Source: Ambulatory Visit | Attending: Oncology | Admitting: Oncology

## 2020-02-17 DIAGNOSIS — M3130 Wegener's granulomatosis without renal involvement: Secondary | ICD-10-CM

## 2020-02-17 DIAGNOSIS — M818 Other osteoporosis without current pathological fracture: Secondary | ICD-10-CM

## 2020-02-24 ENCOUNTER — Encounter: Payer: Self-pay | Admitting: Oncology

## 2020-03-09 ENCOUNTER — Inpatient Hospital Stay: Payer: Medicare Other | Attending: Oncology

## 2020-03-09 ENCOUNTER — Other Ambulatory Visit: Payer: Self-pay

## 2020-03-09 DIAGNOSIS — M3130 Wegener's granulomatosis without renal involvement: Secondary | ICD-10-CM

## 2020-03-09 LAB — COMPREHENSIVE METABOLIC PANEL
ALT: 16 U/L (ref 0–44)
AST: 16 U/L (ref 15–41)
Albumin: 3.7 g/dL (ref 3.5–5.0)
Alkaline Phosphatase: 36 U/L — ABNORMAL LOW (ref 38–126)
Anion gap: 9 (ref 5–15)
BUN: 12 mg/dL (ref 6–20)
CO2: 24 mmol/L (ref 22–32)
Calcium: 9.3 mg/dL (ref 8.9–10.3)
Chloride: 108 mmol/L (ref 98–111)
Creatinine, Ser: 0.89 mg/dL (ref 0.44–1.00)
GFR calc Af Amer: >60 mL/min (ref >60)
GFR calc non Af Amer: >60 mL/min (ref >60)
Glucose, Bld: 72 mg/dL (ref 70–99)
Potassium: 4 mmol/L (ref 3.5–5.1)
Sodium: 141 mmol/L (ref 135–145)
Total Bilirubin: 0.4 mg/dL (ref 0.3–1.2)
Total Protein: 6.6 g/dL (ref 6.5–8.1)

## 2020-03-09 LAB — CBC WITH DIFFERENTIAL/PLATELET
Abs Immature Granulocytes: 0.04 10*3/uL (ref 0.00–0.07)
Basophils Absolute: 0.1 10*3/uL (ref 0.0–0.1)
Basophils Relative: 1 %
Eosinophils Absolute: 0.1 10*3/uL (ref 0.0–0.5)
Eosinophils Relative: 1 %
HCT: 41.1 % (ref 36.0–46.0)
Hemoglobin: 13.1 g/dL (ref 12.0–15.0)
Immature Granulocytes: 0 %
Lymphocytes Relative: 21 %
Lymphs Abs: 2.2 10*3/uL (ref 0.7–4.0)
MCH: 29.4 pg (ref 26.0–34.0)
MCHC: 31.9 g/dL (ref 30.0–36.0)
MCV: 92.2 fL (ref 80.0–100.0)
Monocytes Absolute: 0.8 10*3/uL (ref 0.1–1.0)
Monocytes Relative: 8 %
Neutro Abs: 7.1 10*3/uL (ref 1.7–7.7)
Neutrophils Relative %: 69 %
Platelets: 320 10*3/uL (ref 150–400)
RBC: 4.46 MIL/uL (ref 3.87–5.11)
RDW: 13.5 % (ref 11.5–15.5)
WBC: 10.3 10*3/uL (ref 4.0–10.5)
nRBC: 0 % (ref 0.0–0.2)

## 2020-03-29 NOTE — Progress Notes (Addendum)
Cardiology Office Note:    Date:  03/31/2020   ID:  KARREN NEWLAND, DOB [birth year], MRN [id number]  PCP:  Antony Contras, MD  Cardiologist:  No primary care provider on file.   Referring MD: Belva Crome, MD   Chief Complaint  Patient presents with  . Irregular Heart Beat    Racing    History of Present Illness:    ZAIDY ABSHER is a 59 y.o. female with a hx of Wegener's Granulomatosis and polyangiitis managed by [facility].  Previously treated with rituximab and most recently CellCept which is now on pause.  She is referred by primary care for evaluation of palpitations (PCP is Dr. Antony Contras)..  Very pleasant 59 year old female with Wegener's granulomatosis who has been well managed over the years and currently being managed by Dr. Mariane Baumgarten at the [facility].  She is here today because of recent episodes of sudden tachycardia.  States that for seconds, frequently more than 10 to 20 seconds her heart will take off, race, and she can feel pounding in her head.  Abrupt onset and offset.  Episodes occur most frequently in the morning when she feels stress related to imagining her days activities and mentally dealing with organizing the activity.  The symptoms started after CellCept was initiated for Wegener's granulomatosis therapy. She has been under the stress related to her mother being diagnosed with breast cancer and subsequent to the diagnosis having a stroke after surgery.  The stress associated with that and also a flare of Wegener's have not have not been associated with the palpitations.  Now that she is off CellCept, it appears the palpitations are not as frequent.  She has had mild occurrences intermittently over the past year.  No associated chest discomfort, swelling, orthopnea, PND, syncope, or other complaints.  She is quite active.  She gets greater than 150 minutes of solid moderate aerobic activity per week.  Physical activity is not associated with cardiac  complaints.  Past Medical History:  Diagnosis Date  . Allergy   . Anxiety   . Anxiety state 07/06/2014  . Arthritis   . Calyceal diverticulum of kidney   . Cholelithiasis   . Diverticulosis   . DJD (degenerative joint disease) 07/07/2014  . Dyslipidemia   . Family history of breast cancer in sister 07/07/2014  . Gastritis   . GERD (gastroesophageal reflux disease)   . Goiter, nontoxic, multinodular  per pt history  12/07/2014  . Granulomatosis with polyangiitis ([place]) 03/24/2009   Qualifier: Diagnosis of  By: Doy Mince LPN, Megan    . Headaches, cluster   . Heart murmur   . Hx MRSA infection   . Insomnia  off BZP now  07/06/2014  . Migraine headache 07/06/2014  . Mitral valve prolapse   . Non-seasonal allergic rhinitis   . Osteoporosis   . Sleep disorder 10/20/2014  . Thyroid disease    Thyroid nodules  . Tinnitus   . Varicose veins   . Varicose veins of lower extremities with complications 12/09/5679  . Wegener's granulomatosis (granulomatosis with polyangiitis) ([place]) 01/24/2015  . Wegener's granulomatosis ([place]) 1998   autoimmune vasculitits    Past Surgical History:  Procedure Laterality Date  . AIRWAY FOREIGN BODY REMOVAL     with tracheostomy  . COLONOSCOPY    . ENDOVENOUS ABLATION SAPHENOUS VEIN W/ LASER Left 06-03-2015   endovenous laser ablation left greater saphenous vein and stab phlebectomy 10-20 incisions left leg by Curt Jews MD  .  ENDOVENOUS ABLATION SAPHENOUS VEIN W/ LASER Right 06-24-2015   endovenous laser ablation right greater saphenous vein and stab phlebectomy 10-20 incisions   . LEFT MENISCUS    . MINOR DEVIATED SEPTUM    . NASAL SINUS SURGERY    . TEAR DUCT PROBING Right    x 2  . TEAR DUCT PROBING Left   . TRACHEOSTOMY CLOSURE     over 30 surgeries  . UPPER GASTROINTESTINAL ENDOSCOPY      Current Medications: Current Meds  Medication Sig  . albuterol (PROVENTIL HFA;VENTOLIN HFA) 108 (90 BASE) MCG/ACT inhaler Inhale 2 puffs into the lungs every 6  (six) hours as needed. For shortness of breath or wheezing  . denosumab (PROLIA) 60 MG/ML SOSY injection Inject 60 mg into the skin every 6 (six) months.  . dexlansoprazole (DEXILANT) 60 MG capsule Take 1 capsule (60 mg total) by mouth 2 (two) times daily.  . fluticasone (FLONASE) 50 MCG/ACT nasal spray Place 2 sprays into the nose daily.  . Hypertonic Nasal Wash (SINUS RINSE BOTTLE KIT NA) Place into the nose. Adding Topamax and Budesomibe added to rinse  . Lactobacillus Rhamnosus, GG, (CULTURELLE) CAPS Take by mouth 1 day or 1 dose.  Marland Kitchen LORazepam (ATIVAN) 1 MG tablet Take 1 tablet (1 mg total) by mouth 2 (two) times daily as needed for anxiety.  . Multiple Vitamin (MULTIVITAMIN) tablet Take 1 tablet by mouth daily.  . Multiple Vitamins-Minerals (PRESERVISION AREDS 2 PO) Take 1 tablet by mouth daily.   . predniSONE (DELTASONE) 5 MG tablet Take 1 tablet (5 mg total) by mouth daily with breakfast.  . sulfamethoxazole-trimethoprim (BACTRIM) 400-80 MG tablet Take 1 tablet by mouth daily.  Marland Kitchen zolpidem (AMBIEN) 10 MG tablet Take one tablet hs prn.  Do not take with Ativan     Allergies:   Codeine, Eletriptan, Vancomycin, and Tape   Social History   Socioeconomic History  . Marital status: Divorced    Spouse name: Not on file  . Number of children: Not on file  . Years of education: Not on file  . Highest education level: Not on file  Occupational History  . Occupation: unemployeed    Employer: UNEMPLOYED  Tobacco Use  . Smoking status: Never Smoker  . Smokeless tobacco: Never Used  Vaping Use  . Vaping Use: Never used  Substance and Sexual Activity  . Alcohol use: Yes    Alcohol/week: 0.0 standard drinks    Comment: occasionally  . Drug use: No  . Sexual activity: Not Currently    Partners: Male  Other Topics Concern  . Not on file  Social History Narrative  . Not on file   Social Determinants of Health   Financial Resource Strain:   . Difficulty of Paying Living Expenses:     Food Insecurity:   . Worried About Charity fundraiser in the Last Year:   . Arboriculturist in the Last Year:   Transportation Needs:   . Film/video editor (Medical):   Marland Kitchen Lack of Transportation (Non-Medical):   Physical Activity:   . Days of Exercise per Week:   . Minutes of Exercise per Session:   Stress:   . Feeling of Stress :   Social Connections:   . Frequency of Communication with Friends and Family:   . Frequency of Social Gatherings with Friends and Family:   . Attends Religious Services:   . Active Member of Clubs or Organizations:   . Attends Archivist Meetings:   .  Marital Status:      Family History: The patient's family history includes Asthma in her daughter and sister; Breast cancer in her sister; Breast cancer (age of onset: 68) in her mother; CAD in her father; COPD in her father; Heart disease in her father; Hypertension in her mother; Irritable bowel syndrome in her daughter, sister, and sister; Osteoporosis in her father and mother; Other in her sister and sister; Prostate cancer in her father; Skin cancer in her father; Ulcerative colitis in her sister. There is no history of Colon cancer, Pancreatic cancer, Stomach cancer, Liver disease, Esophageal cancer, or Rectal cancer.  ROS:   Please see the history of present illness.    Early adulthood diagnosis of mitral valve prolapse.  All other systems reviewed and are negative.  EKGs/Labs/Other Studies Reviewed:    The following studies were reviewed today: No new or recent cardiac imaging  EKG:  EKG normal sinus rhythm, PR interval 118 ms, EKG is otherwise normal.  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  Recent Labs: 07/17/2019: TSH 2.630 03/09/2020: ALT 16; BUN 12; Creatinine, Ser 0.89; Hemoglobin 13.1; Platelets 320; Potassium 4.0; Sodium 141  Recent Lipid Panel    Component Value Date/Time   CHOL 212 (H) 07/06/2014 1024   TRIG 145 07/06/2014 1024   HDL 75 07/06/2014 1024   CHOLHDL 2.8 07/06/2014 1024    VLDL 29 07/06/2014 1024   LDLCALC 108 (H) 07/06/2014 1024    Physical Exam:    VS:  BP (!) 98/62   Pulse 66   Ht '5\' 6"'$  (1.676 m)   Wt 160 lb (72.6 kg)   SpO2 97%   BMI 25.82 kg/m     Wt Readings from Last 3 Encounters:  03/31/20 160 lb (72.6 kg)  02/05/20 159 lb 9.6 oz (72.4 kg)  10/30/19 166 lb 4.8 oz (75.4 kg)     GEN: Compatible with age. No acute distress HEENT: Normal NECK: No JVD. LYMPHATICS: No lymphadenopathy CARDIAC:  RRR without murmur, gallop, or edema. VASCULAR:  Normal Pulses. No bruits. RESPIRATORY:  Clear to auscultation without rales, wheezing or rhonchi  ABDOMEN: Soft, non-tender, non-distended, No pulsatile mass, MUSCULOSKELETAL: No deformity  SKIN: Warm and dry NEUROLOGIC:  Alert and oriented x 3 PSYCHIATRIC:  Normal affect   ASSESSMENT:    1. Palpitations   2. Tachycardia   3. Shortened PR interval   4. Mitral valve prolapse   5. Granulomatosis with polyangiitis, unspecified whether renal involvement ([place])   6. Educated about COVID-19 virus infection    PLAN:    In order of problems listed above:  1. Recently has been noticing sudden episodes of rapid heart beating that can last seconds to minutes.  She feels her heart rate is really fast during these episodes.  More common recently and she feels stress related.  30-day monitor will be done to exclude atrial fibrillation and most likely document PSVT. 2. Above symptom according to the patient has a rapid heartbeat. 3. Coincidentally found is short PR interval on EKG which could represent an AV node bypass tract producing the LGL syndrome which can lead to PSVT. 4. Historical diagnosis.  No clinical evidence.  2D Doppler echocardiogram will be done to assess her underlying cardiac substrate in the setting of tachycardia, and systemic inflammatory disease in the form of Wegener's granulomatosis. 5. No comment.  Currently on no specific therapy.  Felt CellCept was causing a problem.  Rituximab  caused prolonged reduction in B-cell count. 6. She has been vaccinated with an mRNA  vaccine.  Follow-up will be as needed based upon findings.   Medication Adjustments/Labs and Tests Ordered: Current medicines are reviewed at length with the patient today.  Concerns regarding medicines are outlined above.  Orders Placed This Encounter  Procedures  . Cardiac event monitor  . EKG 12-Lead  . ECHOCARDIOGRAM COMPLETE   No orders of the defined types were placed in this encounter.   Patient Instructions  Medication Instructions:  Your physician recommends that you continue on your current medications as directed. Please refer to the Current Medication list given to you today.  *If you need a refill on your cardiac medications before your next appointment, please call your pharmacy*   Lab Work: None If you have labs (blood work) drawn today and your tests are completely normal, you will receive your results only by: Marland Kitchen MyChart Message (if you have MyChart) OR . A paper copy in the mail If you have any lab test that is abnormal or we need to change your treatment, we will call you to review the results.   Testing/Procedures: Your physician has requested that you have an echocardiogram. Echocardiography is a painless test that uses sound waves to create images of your heart. It provides your doctor with information about the size and shape of your heart and how well your heart's chambers and valves are working. This procedure takes approximately one hour. There are no restrictions for this procedure.  Your physician has recommended that you wear an event monitor. Event monitors are medical devices that record the heart's electrical activity. Doctors most often [place] these monitors to diagnose arrhythmias. Arrhythmias are problems with the speed or rhythm of the heartbeat. The monitor is a small, portable device. You can wear one while you do your normal daily activities. This is usually used to  diagnose what is causing palpitations/syncope (passing out).      Follow-Up: At [facility], you and your health needs are our priority.  As part of our continuing mission to provide you with exceptional heart care, we have created designated Provider Care Teams.  These Care Teams include your primary Cardiologist (physician) and Advanced Practice Providers (APPs -  Physician Assistants and Nurse Practitioners) who all work together to provide you with the care you need, when you need it.  We recommend signing up for the patient portal called "MyChart".  Sign up information is provided on this After Visit Summary.  MyChart is used to connect with patients for Virtual Visits (Telemedicine).  Patients are able to view lab/test results, encounter notes, upcoming appointments, etc.  Non-urgent messages can be sent to your provider as well.   To learn more about what you can do with MyChart, go to [email or web address].    Your next appointment:   As needed  The format for your next appointment:   In Person  Provider:   You may see Dr Daneen Schick or one of the following Advanced Practice Providers on your designated Care Team:    Truitt Merle, NP  Cecilie Kicks, NP  Kathyrn Drown, NP         Signed, Sinclair Grooms, MD  03/31/2020 10:57 AM    [place]

## 2020-03-31 ENCOUNTER — Other Ambulatory Visit: Payer: Self-pay

## 2020-03-31 ENCOUNTER — Encounter: Payer: Self-pay | Admitting: Interventional Cardiology

## 2020-03-31 ENCOUNTER — Ambulatory Visit: Payer: Medicare Other | Admitting: Interventional Cardiology

## 2020-03-31 VITALS — BP 98/62 | HR 66 | Ht 66.0 in | Wt 160.0 lb

## 2020-03-31 DIAGNOSIS — R002 Palpitations: Secondary | ICD-10-CM

## 2020-03-31 DIAGNOSIS — R000 Tachycardia, unspecified: Secondary | ICD-10-CM | POA: Diagnosis not present

## 2020-03-31 DIAGNOSIS — I341 Nonrheumatic mitral (valve) prolapse: Secondary | ICD-10-CM

## 2020-03-31 DIAGNOSIS — Z7189 Other specified counseling: Secondary | ICD-10-CM

## 2020-03-31 DIAGNOSIS — R9431 Abnormal electrocardiogram [ECG] [EKG]: Secondary | ICD-10-CM | POA: Diagnosis not present

## 2020-03-31 DIAGNOSIS — M3130 Wegener's granulomatosis without renal involvement: Secondary | ICD-10-CM

## 2020-03-31 NOTE — Patient Instructions (Signed)
Medication Instructions:  Your physician recommends that you continue on your current medications as directed. Please refer to the Current Medication list given to you today.  *If you need a refill on your cardiac medications before your next appointment, please call your pharmacy*   Lab Work: None If you have labs (blood work) drawn today and your tests are completely normal, you will receive your results only by: Marland Kitchen MyChart Message (if you have MyChart) OR . A paper copy in the mail If you have any lab test that is abnormal or we need to change your treatment, we will call you to review the results.   Testing/Procedures: Your physician has requested that you have an echocardiogram. Echocardiography is a painless test that uses sound waves to create images of your heart. It provides your doctor with information about the size and shape of your heart and how well your heart's chambers and valves are working. This procedure takes approximately one hour. There are no restrictions for this procedure.  Your physician has recommended that you wear an event monitor. Event monitors are medical devices that record the heart's electrical activity. Doctors most often [place] these monitors to diagnose arrhythmias. Arrhythmias are problems with the speed or rhythm of the heartbeat. The monitor is a small, portable device. You can wear one while you do your normal daily activities. This is usually used to diagnose what is causing palpitations/syncope (passing out).      Follow-Up: At [facility], you and your health needs are our priority.  As part of our continuing mission to provide you with exceptional heart care, we have created designated Provider Care Teams.  These Care Teams include your primary Cardiologist (physician) and Advanced Practice Providers (APPs -  Physician Assistants and Nurse Practitioners) who all work together to provide you with the care you need, when you need it.  We recommend  signing up for the patient portal called "MyChart".  Sign up information is provided on this After Visit Summary.  MyChart is used to connect with patients for Virtual Visits (Telemedicine).  Patients are able to view lab/test results, encounter notes, upcoming appointments, etc.  Non-urgent messages can be sent to your provider as well.   To learn more about what you can do with MyChart, go to [email or web address].    Your next appointment:   As needed  The format for your next appointment:   In Person  Provider:   You may see Dr Daneen Schick or one of the following Advanced Practice Providers on your designated Care Team:    Truitt Merle, NP  Cecilie Kicks, NP  Kathyrn Drown, NP

## 2020-04-05 ENCOUNTER — Encounter: Payer: Self-pay | Admitting: Oncology

## 2020-04-08 ENCOUNTER — Telehealth: Payer: Self-pay | Admitting: Interventional Cardiology

## 2020-04-08 NOTE — Telephone Encounter (Signed)
Patient called requesting to speak with Darrick Penna in regards to her mychart message. Transferred call to [facility].

## 2020-04-09 ENCOUNTER — Other Ambulatory Visit: Payer: Medicare Other

## 2020-04-10 ENCOUNTER — Encounter (INDEPENDENT_AMBULATORY_CARE_PROVIDER_SITE_OTHER): Payer: Medicare Other

## 2020-04-10 ENCOUNTER — Encounter: Payer: Self-pay | Admitting: Interventional Cardiology

## 2020-04-10 DIAGNOSIS — R000 Tachycardia, unspecified: Secondary | ICD-10-CM | POA: Diagnosis not present

## 2020-04-10 DIAGNOSIS — R002 Palpitations: Secondary | ICD-10-CM

## 2020-04-13 ENCOUNTER — Other Ambulatory Visit: Payer: Self-pay

## 2020-04-13 ENCOUNTER — Inpatient Hospital Stay: Payer: Medicare Other | Attending: Oncology

## 2020-04-13 DIAGNOSIS — M3130 Wegener's granulomatosis without renal involvement: Secondary | ICD-10-CM | POA: Diagnosis not present

## 2020-04-13 LAB — CBC WITH DIFFERENTIAL/PLATELET
Abs Immature Granulocytes: 0.02 10*3/uL (ref 0.00–0.07)
Basophils Absolute: 0.1 10*3/uL (ref 0.0–0.1)
Basophils Relative: 1 %
Eosinophils Absolute: 0.1 10*3/uL (ref 0.0–0.5)
Eosinophils Relative: 2 %
HCT: 41.9 % (ref 36.0–46.0)
Hemoglobin: 13.4 g/dL (ref 12.0–15.0)
Immature Granulocytes: 0 %
Lymphocytes Relative: 31 %
Lymphs Abs: 2.2 10*3/uL (ref 0.7–4.0)
MCH: 29.9 pg (ref 26.0–34.0)
MCHC: 32 g/dL (ref 30.0–36.0)
MCV: 93.5 fL (ref 80.0–100.0)
Monocytes Absolute: 0.7 10*3/uL (ref 0.1–1.0)
Monocytes Relative: 10 %
Neutro Abs: 3.9 10*3/uL (ref 1.7–7.7)
Neutrophils Relative %: 56 %
Platelets: 315 10*3/uL (ref 150–400)
RBC: 4.48 MIL/uL (ref 3.87–5.11)
RDW: 13.1 % (ref 11.5–15.5)
WBC: 6.9 10*3/uL (ref 4.0–10.5)
nRBC: 0 % (ref 0.0–0.2)

## 2020-04-13 LAB — COMPREHENSIVE METABOLIC PANEL
ALT: 12 U/L (ref 0–44)
AST: 14 U/L — ABNORMAL LOW (ref 15–41)
Albumin: 3.4 g/dL — ABNORMAL LOW (ref 3.5–5.0)
Alkaline Phosphatase: 37 U/L — ABNORMAL LOW (ref 38–126)
Anion gap: 7 (ref 5–15)
BUN: 16 mg/dL (ref 6–20)
CO2: 25 mmol/L (ref 22–32)
Calcium: 9.5 mg/dL (ref 8.9–10.3)
Chloride: 107 mmol/L (ref 98–111)
Creatinine, Ser: 0.88 mg/dL (ref 0.44–1.00)
GFR calc Af Amer: >60 mL/min (ref >60)
GFR calc non Af Amer: >60 mL/min (ref >60)
Glucose, Bld: 80 mg/dL (ref 70–99)
Potassium: 4.2 mmol/L (ref 3.5–5.1)
Sodium: 139 mmol/L (ref 135–145)
Total Bilirubin: 0.5 mg/dL (ref 0.3–1.2)
Total Protein: 6.2 g/dL — ABNORMAL LOW (ref 6.5–8.1)

## 2020-04-16 ENCOUNTER — Other Ambulatory Visit: Payer: Self-pay

## 2020-04-16 ENCOUNTER — Ambulatory Visit (HOSPITAL_COMMUNITY): Payer: Medicare Other | Attending: Interventional Cardiology

## 2020-04-16 DIAGNOSIS — R002 Palpitations: Secondary | ICD-10-CM | POA: Insufficient documentation

## 2020-04-16 DIAGNOSIS — M3130 Wegener's granulomatosis without renal involvement: Secondary | ICD-10-CM | POA: Insufficient documentation

## 2020-04-16 DIAGNOSIS — I340 Nonrheumatic mitral (valve) insufficiency: Secondary | ICD-10-CM | POA: Insufficient documentation

## 2020-04-16 DIAGNOSIS — R000 Tachycardia, unspecified: Secondary | ICD-10-CM | POA: Insufficient documentation

## 2020-04-16 LAB — ECHOCARDIOGRAM COMPLETE
Area-P 1/2: 4.68 cm2
S' Lateral: 2.9 cm

## 2020-04-28 ENCOUNTER — Telehealth: Payer: Self-pay

## 2020-04-28 NOTE — Telephone Encounter (Signed)
Received fax report from [place] for a patient triggered event. Report shows patient was in sinus rhythm with HR of 78.  I called the patient and she reports that she hit the button because she felt her heart was fluttering. She states that she did not pass out and did not have any additional symptoms. Will continue to monitor.

## 2020-05-01 ENCOUNTER — Ambulatory Visit: Payer: Medicare Other | Attending: Internal Medicine

## 2020-05-01 DIAGNOSIS — Z23 Encounter for immunization: Secondary | ICD-10-CM

## 2020-05-01 NOTE — Progress Notes (Signed)
   Covid-19 Vaccination Clinic  Name:  JLYNN LY    MRN: [id number] DOB: [birth year]  05/01/2020  Ms. Krysia Zahradnik was observed post Covid-19 immunization for 15 minutes without incident. She was provided with Vaccine Information Sheet and instruction to access the V-Safe system.   Ms. Marlys Stegmaier was instructed to call 911 with any severe reactions post vaccine: Marland Kitchen Difficulty breathing  . Swelling of face and throat  . A fast heartbeat  . A bad rash all over body  . Dizziness and weakness

## 2020-05-11 ENCOUNTER — Other Ambulatory Visit: Payer: Self-pay

## 2020-05-11 ENCOUNTER — Inpatient Hospital Stay: Payer: Medicare Other | Attending: Oncology

## 2020-05-11 DIAGNOSIS — M3130 Wegener's granulomatosis without renal involvement: Secondary | ICD-10-CM | POA: Insufficient documentation

## 2020-05-11 LAB — CBC WITH DIFFERENTIAL/PLATELET
Abs Immature Granulocytes: 0.05 10*3/uL (ref 0.00–0.07)
Basophils Absolute: 0.1 10*3/uL (ref 0.0–0.1)
Basophils Relative: 1 %
Eosinophils Absolute: 0.2 10*3/uL (ref 0.0–0.5)
Eosinophils Relative: 2 %
HCT: 44 % (ref 36.0–46.0)
Hemoglobin: 14.1 g/dL (ref 12.0–15.0)
Immature Granulocytes: 1 %
Lymphocytes Relative: 31 %
Lymphs Abs: 2.8 10*3/uL (ref 0.7–4.0)
MCH: 29.9 pg (ref 26.0–34.0)
MCHC: 32 g/dL (ref 30.0–36.0)
MCV: 93.4 fL (ref 80.0–100.0)
Monocytes Absolute: 0.8 10*3/uL (ref 0.1–1.0)
Monocytes Relative: 8 %
Neutro Abs: 5.2 10*3/uL (ref 1.7–7.7)
Neutrophils Relative %: 57 %
Platelets: 355 10*3/uL (ref 150–400)
RBC: 4.71 MIL/uL (ref 3.87–5.11)
RDW: 12.9 % (ref 11.5–15.5)
WBC: 9 10*3/uL (ref 4.0–10.5)
nRBC: 0 % (ref 0.0–0.2)

## 2020-05-11 LAB — COMPREHENSIVE METABOLIC PANEL
ALT: 18 U/L (ref 0–44)
AST: 14 U/L — ABNORMAL LOW (ref 15–41)
Albumin: 3.7 g/dL (ref 3.5–5.0)
Alkaline Phosphatase: 41 U/L (ref 38–126)
Anion gap: 6 (ref 5–15)
BUN: 18 mg/dL (ref 6–20)
CO2: 27 mmol/L (ref 22–32)
Calcium: 9.2 mg/dL (ref 8.9–10.3)
Chloride: 107 mmol/L (ref 98–111)
Creatinine, Ser: 0.95 mg/dL (ref 0.44–1.00)
GFR calc Af Amer: >60 mL/min (ref >60)
GFR calc non Af Amer: >60 mL/min (ref >60)
Glucose, Bld: 78 mg/dL (ref 70–99)
Potassium: 4.2 mmol/L (ref 3.5–5.1)
Sodium: 140 mmol/L (ref 135–145)
Total Bilirubin: 0.5 mg/dL (ref 0.3–1.2)
Total Protein: 7.1 g/dL (ref 6.5–8.1)

## 2020-05-11 NOTE — Telephone Encounter (Signed)
MR- please see email from the pt:   Theresa [name] sent to [facility] Caller- I hope all is well. I canceled my appointment for tomorrow because it's not mission critical and I am sure you are busy running between the Covid wing and office visits. My app was primarily to let you know where things stand with me and what's been happening. All in all, things have been fine.   I asked about a televisit but was told the office is trying to get away from those unless the numbers keep climbing , then they will possibly go back to them.   It's been rescheduled for Oct. 14@9am . Your first app of the day. That way I won't be waiting with a lot of patients in the waiting area. I have received both vaccines and booster Therapist, music.)   Take good care,   [employer]

## 2020-05-11 NOTE — Telephone Encounter (Signed)
Ok thanks. See you in October. Glad things are stable

## 2020-05-12 ENCOUNTER — Ambulatory Visit: Payer: Medicare Other | Admitting: Internal Medicine

## 2020-05-13 ENCOUNTER — Telehealth: Payer: Self-pay | Admitting: *Deleted

## 2020-05-13 ENCOUNTER — Other Ambulatory Visit: Payer: Self-pay | Admitting: *Deleted

## 2020-05-13 ENCOUNTER — Encounter: Payer: Self-pay | Admitting: Oncology

## 2020-05-13 DIAGNOSIS — M3130 Wegener's granulomatosis without renal involvement: Secondary | ICD-10-CM

## 2020-05-13 NOTE — Telephone Encounter (Signed)
This RN returned VM left by the pt stating concern due to onset of nose bleed " worst I have ever had "  Theresa [name] states she had a sudden onset of nose bleeds " that happened so fast that it would fill up in my mouth "  She states above occurred for 20 minutes and then resolved - she gently used saline spray and has not had further issues.  She states above may be " a flare in my Wegner's and would like to get labs checked for my titers "  She states Dr [name] - her primary MD at [facility] who manages her diagnosis is out of town- "but I do not want to wait until he returns "  She states she usually has an ANCA, sed rate for monitoring in addition to a [employer] provides.  She understands the labs per the [facility] kit cannot be done " because Dr [name] orders and then they send it to me "  This RN reviewed with covering provider- and obtained time in AM for lab- orders entered.  Per discussion with pt this RN advised her if nose bleed returns without control she is to call the on call MD - and then proceed to the ER- if more severe she is to call EMS.  [name] verbalized understanding.

## 2020-05-14 ENCOUNTER — Encounter: Payer: Self-pay | Admitting: Interventional Cardiology

## 2020-05-14 ENCOUNTER — Inpatient Hospital Stay: Payer: Medicare Other

## 2020-05-14 ENCOUNTER — Other Ambulatory Visit: Payer: Self-pay

## 2020-05-14 ENCOUNTER — Telehealth: Payer: Self-pay | Admitting: *Deleted

## 2020-05-14 ENCOUNTER — Telehealth: Payer: Self-pay | Admitting: Interventional Cardiology

## 2020-05-14 DIAGNOSIS — M3130 Wegener's granulomatosis without renal involvement: Secondary | ICD-10-CM | POA: Diagnosis not present

## 2020-05-14 LAB — CBC WITH DIFFERENTIAL (CANCER CENTER ONLY)
Abs Immature Granulocytes: 0.03 10*3/uL (ref 0.00–0.07)
Basophils Absolute: 0.1 10*3/uL (ref 0.0–0.1)
Basophils Relative: 1 %
Eosinophils Absolute: 0.2 10*3/uL (ref 0.0–0.5)
Eosinophils Relative: 2 %
HCT: 42.9 % (ref 36.0–46.0)
Hemoglobin: 13.6 g/dL (ref 12.0–15.0)
Immature Granulocytes: 0 %
Lymphocytes Relative: 39 %
Lymphs Abs: 3.3 10*3/uL (ref 0.7–4.0)
MCH: 29.5 pg (ref 26.0–34.0)
MCHC: 31.7 g/dL (ref 30.0–36.0)
MCV: 93.1 fL (ref 80.0–100.0)
Monocytes Absolute: 0.8 10*3/uL (ref 0.1–1.0)
Monocytes Relative: 9 %
Neutro Abs: 4.2 10*3/uL (ref 1.7–7.7)
Neutrophils Relative %: 49 %
Platelet Count: 338 10*3/uL (ref 150–400)
RBC: 4.61 MIL/uL (ref 3.87–5.11)
RDW: 13 % (ref 11.5–15.5)
WBC Count: 8.6 10*3/uL (ref 4.0–10.5)
nRBC: 0 % (ref 0.0–0.2)

## 2020-05-14 LAB — COMPREHENSIVE METABOLIC PANEL
ALT: 16 U/L (ref 0–44)
AST: 15 U/L (ref 15–41)
Albumin: 3.6 g/dL (ref 3.5–5.0)
Alkaline Phosphatase: 37 U/L — ABNORMAL LOW (ref 38–126)
Anion gap: 6 (ref 5–15)
BUN: 16 mg/dL (ref 6–20)
CO2: 27 mmol/L (ref 22–32)
Calcium: 9.2 mg/dL (ref 8.9–10.3)
Chloride: 106 mmol/L (ref 98–111)
Creatinine, Ser: 0.95 mg/dL (ref 0.44–1.00)
GFR calc Af Amer: >60 mL/min (ref >60)
GFR calc non Af Amer: >60 mL/min (ref >60)
Glucose, Bld: 81 mg/dL (ref 70–99)
Potassium: 4.3 mmol/L (ref 3.5–5.1)
Sodium: 139 mmol/L (ref 135–145)
Total Bilirubin: 0.4 mg/dL (ref 0.3–1.2)
Total Protein: 6.7 g/dL (ref 6.5–8.1)

## 2020-05-14 LAB — SEDIMENTATION RATE: Sed Rate: 4 mm/hr (ref 0–22)

## 2020-05-14 NOTE — Telephone Encounter (Signed)
Pt called and stated that she needed to speak to Dr. Tamala Julian nurse. Would not give out information. Please call back

## 2020-05-14 NOTE — Telephone Encounter (Signed)
Left message to call back  

## 2020-05-14 NOTE — Telephone Encounter (Signed)
Patient calling to speak with Dr. Thompson Caul nurse in regards to monitor results.

## 2020-05-14 NOTE — Telephone Encounter (Signed)
Spoke with pt and reviewed results of monitor.  Pt asked about the bradycardia.  Advised this was fine and not unusual to see at night.  Pt appreciative for call.

## 2020-05-14 NOTE — Telephone Encounter (Signed)
This encounter was created in error - please disregard.

## 2020-05-14 NOTE — Telephone Encounter (Signed)
Pt came in for lab - reviewed CBC with covering provider with stable readings- sed rate and ANCA pending.  Face to face discussion with pt of CBC results - as well as verification pt has had no further nose bleeds.  She states she has not had a flare for years.  She did receive the Covid booster 05/01/2020.  She has put a call into her Rheumatologist at [facility] as well.  No further needs at this time - will await further lab results and contact pt at home.

## 2020-05-17 ENCOUNTER — Other Ambulatory Visit: Payer: Self-pay | Admitting: Internal Medicine

## 2020-05-17 DIAGNOSIS — E042 Nontoxic multinodular goiter: Secondary | ICD-10-CM

## 2020-05-17 LAB — ANCA TITERS
Atypical P-ANCA titer: <1:20 {titer}
C-ANCA: <1:20 {titer}
P-ANCA: <1:20 {titer}

## 2020-05-21 ENCOUNTER — Ambulatory Visit
Admission: RE | Admit: 2020-05-21 | Discharge: 2020-05-21 | Disposition: A | Discharge status: Home / Self Care | Payer: Medicare Other | Source: Ambulatory Visit | Attending: Internal Medicine | Admitting: Internal Medicine

## 2020-05-21 DIAGNOSIS — E042 Nontoxic multinodular goiter: Secondary | ICD-10-CM

## 2020-06-07 ENCOUNTER — Other Ambulatory Visit: Payer: Self-pay | Admitting: Internal Medicine

## 2020-06-07 DIAGNOSIS — E041 Nontoxic single thyroid nodule: Secondary | ICD-10-CM

## 2020-06-08 ENCOUNTER — Encounter: Payer: Self-pay | Admitting: Hematology & Oncology

## 2020-06-10 ENCOUNTER — Inpatient Hospital Stay: Payer: Medicare Other | Attending: Oncology

## 2020-06-10 ENCOUNTER — Other Ambulatory Visit: Payer: Self-pay

## 2020-06-10 DIAGNOSIS — M3130 Wegener's granulomatosis without renal involvement: Secondary | ICD-10-CM | POA: Diagnosis not present

## 2020-06-10 LAB — COMPREHENSIVE METABOLIC PANEL
ALT: 16 U/L (ref 0–44)
AST: 16 U/L (ref 15–41)
Albumin: 3.4 g/dL — ABNORMAL LOW (ref 3.5–5.0)
Alkaline Phosphatase: 47 U/L (ref 38–126)
Anion gap: 3 — ABNORMAL LOW (ref 5–15)
BUN: 19 mg/dL (ref 6–20)
CO2: 27 mmol/L (ref 22–32)
Calcium: 9.5 mg/dL (ref 8.9–10.3)
Chloride: 107 mmol/L (ref 98–111)
Creatinine, Ser: 0.87 mg/dL (ref 0.44–1.00)
GFR calc non Af Amer: >60 mL/min (ref >60)
Glucose, Bld: 79 mg/dL (ref 70–99)
Potassium: 4.3 mmol/L (ref 3.5–5.1)
Sodium: 137 mmol/L (ref 135–145)
Total Bilirubin: 0.5 mg/dL (ref 0.3–1.2)
Total Protein: 6.8 g/dL (ref 6.5–8.1)

## 2020-06-10 LAB — CBC WITH DIFFERENTIAL/PLATELET
Abs Immature Granulocytes: 0.05 10*3/uL (ref 0.00–0.07)
Basophils Absolute: 0.1 10*3/uL (ref 0.0–0.1)
Basophils Relative: 1 %
Eosinophils Absolute: 0.1 10*3/uL (ref 0.0–0.5)
Eosinophils Relative: 1 %
HCT: 40.1 % (ref 36.0–46.0)
Hemoglobin: 13.1 g/dL (ref 12.0–15.0)
Immature Granulocytes: 0 %
Lymphocytes Relative: 8 %
Lymphs Abs: 1 10*3/uL (ref 0.7–4.0)
MCH: 29.6 pg (ref 26.0–34.0)
MCHC: 32.7 g/dL (ref 30.0–36.0)
MCV: 90.7 fL (ref 80.0–100.0)
Monocytes Absolute: 0.9 10*3/uL (ref 0.1–1.0)
Monocytes Relative: 7 %
Neutro Abs: 10.3 10*3/uL — ABNORMAL HIGH (ref 1.7–7.7)
Neutrophils Relative %: 83 %
Platelets: 327 10*3/uL (ref 150–400)
RBC: 4.42 MIL/uL (ref 3.87–5.11)
RDW: 13 % (ref 11.5–15.5)
WBC: 12.4 10*3/uL — ABNORMAL HIGH (ref 4.0–10.5)
nRBC: 0 % (ref 0.0–0.2)

## 2020-06-17 ENCOUNTER — Ambulatory Visit: Payer: Medicare Other | Admitting: Internal Medicine

## 2020-06-17 ENCOUNTER — Encounter: Payer: Self-pay | Admitting: Internal Medicine

## 2020-06-17 ENCOUNTER — Other Ambulatory Visit: Payer: Self-pay

## 2020-06-17 VITALS — BP 122/74 | HR 69 | Temp 96.9°F | Ht 66.0 in | Wt 161.1 lb

## 2020-06-17 DIAGNOSIS — M3130 Wegener's granulomatosis without renal involvement: Secondary | ICD-10-CM

## 2020-06-17 NOTE — Patient Instructions (Signed)
ICD-10-CM   1. Granulomatosis with polyangiitis, unspecified whether renal involvement ([place])  M31.30     Continue care plan outlined by [facility] Per their note in august 2021  -  Vasculitis plan of care going forward is: - Prednisone5mg  daily  - Bactrim SS daily for PJP prophylaxis -  Continue to hold CellCept (500 mg TWICE daily - held since 03/07/2020) - Discontinue monthly medication monitoring - No need to monitor B-cells or ANCA  -Shingrix vaccineseries- 1st dose 01/26/2020,2nd dose PENDING - Observe and only retreat with rituximab if new symptoms arise that clearly indicate a disease relapse -Annual return due April 2022   Plan  - routine 30 min followup in 6-9 months - on-site visit - keep up with [facility] and [facility]  = recommend scheduled video visits with [facility] atleast every 3-9 months per your and their mutual discussion

## 2020-06-17 NOTE — Progress Notes (Signed)
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      60 year old female. 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  CAre direction is from Dr [name] at [facility] in [place], [place]. Main reason she is here she wants local pulmonologist back up  Reports that in  1998 when pregnant with daughter she developed severe headaches and sinusitis. She recollects mulitple inteventions and Rx prior sinus surgery x 2, prior right tear duct surgery at [facility] for epiphora (with subsequent re-obstruction). She also has a history of nasal saddling and inflammation left greater than right.  And biopsies with no diagnosis. In 1999 had upper airway obstruction and underwent emergency trach at [facility] (Dr [name]). 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   She has been treated in the past with Cytoxan and prednisone, methotrexate very briefly, Cytoxan orally, then Imuran for some time, and then off treatment for some years. She was started on prednisone alone in 2009, and then in 06/2008 the azathioprine was added again at 150 mg daily, and variable doses of prednisone. Then in end 2013, early 2013 had relapse and needed rituxan and was felt  She could not go below prednisone $RemoveBeforeDE'20mg'YXdJHJYPSjYhvVW$  per day. She had first dose at [facility] and subsequent 3 weekly  doses with [name] early 2013.  Aside from the Wegener, she has a history of osteopenia, history of migraines, history  of GERD, history of mitral valve prolapse, and a history of MRSA infection remotely (1999).   In 2014: but it appers disease in stable/remission with prednisone and in 2014: she was able to get disability. In May 2015 She followed with [name], disease was felt to be stable. HEr main issues were fatigue, insomnia , anxiety, and mild anxiety. SHe has clarified that overall she has handled lif stressors well and does not suffer from anxiety disorder per se. IF atll anxiety is mild and only situational  She is now referred because she wants a local Rx team. She feels her [facility] eNT team is retiring. And apparently Dr [name] at [facility] is moving towards research + expensive to fly up there. So far Rx decisions have come from [facility] and not from [place] [facility]. She is making a trip up there in August 2015 and will see if she can continue going there but she will likely look for increasing Rx decisions from [place].  From pulm perspective, she prefers [facility] for admissions and prefers pulmonary admit her if she became critically ill. She wants airway support in [place]; we discussed local ENT docs. For now, she wants to hold off establishing someone as OPD. She is aware that if critical, ER and PCCM will coordinate that.  She also wants me to be her resouruce locally  I have agreed to her care plan     Smoking hx  reports that she has never smoked. She has never  used smokeless tobacco.   CXR Oct 2012   RADIOLOGY REPORT*  Clinical Data: Cough for 5 days. Wegener's granulomatosis.  CHEST - 2 VIEW  Comparison: None.  Findings: Emphysema is present with flattening of hemidiaphragms  and enlargement retrosternal clear space. Bronchitic changes are  present in the lower lobes bilaterally. Cardiopericardial  silhouette and mediastinal contours appear within normal limits.  Vertebral body height is preserved. No destructive osseous lesions  are identified. No plain film evidence of adenopathy.  IMPRESSION:    Hyperinflation consist with emphysema. No active cardiopulmonary  disease.  Original Report Authenticated By: Dereck Ligas, M.D.    Ok Edwards 06/18/2014  Chief Complaint  Patient presents with  . Follow-up    Pt state since last OV pt states she is feeling better. Pt states she is going to [facility] in November. Pt denies SOB, cough and CP/tightness.     Followup GPA   - She has postponed trip to [facility] due to father and father in law being sick with cancer. She did have soe headache, MRI Was normal per hx and headaches attributed to ativan weaning (she is tyrin to get herself off ativan which she has been on for decades). Shee feels wegner under remission. No hematuria or respiratory issues. Will defer labs to [facility] visit. Recent PR-3 aparently up a point. She has not had PREVNAR and will have it 06/18/2014   She did clarify to me that plan in case of airway emergency is NOT for [place] as indicated in visit June 2015 but [place] where she has new team (pripr docs have retired)     Past: She is trying to wean herself off [place] 01/22/2015  Chief Complaint  Patient presents with  . Follow-up    [facility] with [facility]. Pt stated her breathing is doing well. Pt c/o mild prod cough with clear mucus. Pt denies CP/tightness.     Follow-up GPA  Last seen October 2015. After that she went for follow-up to [facility] in [place], [place]. Overall she's doing well. No respiratory issues. Occasionally there is some voice change. This is due to subglottic stenosis. [facility]: Nov 2015 patient is concerned about the fact her B cells are low. She wants to know what this means. I will try to get in touch with  [facility]  Drs [name] attending or Dr Regan Lemming ? Fellow to answer this questionENT at [facility] evaluated her noted to have minimal erythema at right nare but stable subglottic area without inflammation ENT at [facility] evaluated her noted to have  minimal erythema at right nare but stable subglottic area without inflammation. At that visit Nov 2015 - per notes  B Cell pending but PR-3 ANCA is 0.2.  Ovverall disease GPA was felt to be in remission  Their recommendaton 1) . She also asked them about coming off prednisone. They did not that she has had prednisoen burst 4-5 ties between her visit with them April 2014 and Nov 2015 with last burst in may 2015. They have maintained equipose in recommending if she can come off prednisone but are ok wit her trying to come off which she has done at this point. 2)  monitor Pr-3 Q3 months 3) continue bactrim 1 tab daily ( I do not see it in her [facility] today may 2016 visit)   [facility] 07/10/2014 notes reviewed and things I learned are  - GPA diagnosed June 1998 due ing 2nd prednancy.   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 s/p induction rituxan with subsequent slow to reconstitute and maintained on prednisone $RemoveBefor'5mg'FpgIkUPvcnJi$  daily which seems to control her symptoms  And since then ANCA- negative  - April 2013: [place] 1 but ANCA negative (was positive prior to rituxan) - April 2014: Last seen by Dr Adan Sis prior to Nov 2015 visit. PR-3 anca negative this time - Rx Bactrim 1DS BID. Risk for relapse considered low  Past medical history since I las saw her -  Got her second reclast  infusion recently and apparently this infusion was too fast and she had some side effects and intolerance - She has tennis elbow on right side  - She also has a left fourth toe bruising/fracture being managed conservatively - tried to wean off ativan (chronic) but did not work  1) Pred started in 1998/1999 - was off of it for about 1 year in the last 18 years but has been consistently on it since beginning.  2) Pt was started in Bactrim in 1999 - has been on this daily since then at $Remov'400mg'rifYkI$  daily. Pt was on $Remo'800mg'tQgXI$  but decreased to $RemoveBefo'400mg'qEMmrhAPYUZ$ . Pt was told this keeps her PNA at [place]. 3) ANCA is being managed by [name] at the [facility]. Pt receives a lab kit in them mail, takes it to [facility] and once this is drawn the kit is mailed back off.    OV 09/14/2015  Chief Complaint  Patient presents with  . Follow-up    Pt recently fractured her left foot. Pt states she recently had BIL varicose vein surgery. Pt c/o prod cough with green mucus. Pt denies CP/tightness and SOB.    Follow-up granulomatous polyangiitis  Six-month follow-up. In interim has fractured her left foot particularly the left fifth metatarsal and has been in acast for the last few months/several weeks. She is under the care of Dr. [name] at [place]. She has a new primary care physician. She feels vasculitis is in remission. Last visit to [facility] was in November 2015 and current follow-up is on hold until the cast comes off which he hopes this today. After this she will start physical therapy at [place] possibly. Because of the fracture which is been deemed due to osteoporosis prednisone has been cut down to 4 mg per day by her and new endocrinologist Dr. [name]. At this point in time she has a cough which is baseline for the winter but she does not feel she is infected and overall feels vasculitis is in remission.   OV 12/28/2017  Chief Complaint   Patient presents with  . Follow-up    Last seen 09/14/15. Pt states oncologist wanted pt to  come make a visit with MR. States she has been coughing a lot with clear to yellow mucus. pt stated she had the flu and bacterial infection beginning of April 2019.   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  It is been over 2 years since I last saw her.  In the interim in May 2018 had follow-up visit at [facility].  She reports that she saw Dr. Claire Shown national expert and this condition.  She was advised that her B cells has still undetectable/very mildly detectable.  So she continues on Bactrim.  She is on observation therapy.  Her disease is being considered [burnt out/remission].  She saw ENT Dr. Shawn Stall.  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  She states that even [facility] is advised her that only Dr. Shawn Stall at [facility] can do the surgery.  Her next visit to [facility] in [place] is scheduled somewhere between May and July 2019.  She is trying to figure out the finances for her trip now that she separated from her husband and their financial issues.  From a pulmonary perspective she reports that 3 or 4 months ago she tried to wean herself off her acid reflux medications and prednisone and when she did that successfully her cough got worse/started - new issue for me.  She was seen at [facility] for worsening arthralgia.  Prescribed Plaquenil but this did not go well with her.  She still is remaining off the Plaquenil.  Then approximately 3 or 4 weeks ago early part of April 2019 she had a bronchitis episode with colored sputum suspected influenza and was given antibiotics at this point in time the cough got worse.  She then reintroduced her acid reflux medications of PPI and H2 blockade.  After this and the treatment of her bronchitis her cough  has gotten better but still not back to full baseline.  From a energy perspective she is doing overall better.  Of note last year at [facility] her ANCA antibodies were negative  FeNO 12/28/2017 - 26 ppb and mikdly elevated in grey zone   OV 07/02/2018  Subjective:  Patient ID: Theresa [name], female , DOB: [birth year] , age 44 y.o. , MRN: [id number] , ADDRESS: [place] [id number]   07/02/2018 -   Chief Complaint  Patient presents with  . Follow-up    chronic cough     HPI Theresa [name] 59 y.o. -presents for follow-up of chronic cough for which inhaled steroids help.  This in the background of burned-out vasculitis where she had suppressed B cells after one course Rx with rituxan nearly a decade ago  .  In the interim she did visit with Dr.Ulrich Specks world expert and vasculitis disorders at the [facility] in [place], [place].  This was in July 2019.  I reviewed the records from [facility]. Marland Kitchen  She had CT scan of the sinuses that showed opacification and osteitis of the maxillary sinus.  There is also inward bowing of the axillary sinus.  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  This was interpreted as nonspecific in the context of previous GPA without sign of disease activity.  Patient states these findings are chronic.  At the time of her visit with him his B-cell counts were pending but she tells me that this slowly reemerging.  Her ESR and C-reactive protein were normal.  She did show me her PR-3 levels is also slowly reemerging.  Her gammaglobulin level had improved to 904 in July 2019 at the [facility] she is trying to get hold of the [facility] to get an interpretation on this.  After this visit I also emailed Dr. Claire Shown and waiting to hear.  Her [place] vasculitis score was 0.  But patient tells me that she feels fatigued all the time particularly after coming off prednisone.  However she does not want to go back on prednisone.  Her  cough is an issue and she wants a refill on the inhaled steroids.  At the [facility] she did express that acid reflux is a major problem for her.  She takes Bactrim but a bit little bit less rigid than the prescribed schedule. He has recommended GI evaluation to exclude the possibility of Barrett's esophagus or gastritis. In October 2019 she followed up with Dr. [name] ENT at [facility].  The laryngeal exam was stable and showed mild tracheal stenosis.  Continued observation therapy was recommended.  She gets her labs drawn through the oncology clinic at [facility] with Dr. [name] but the kits come from the [facility]  We discussed about doing these tests here locally but she prefers that the kit  come from the [facility] itself and Dr. [name] sense of the blood   Lab test at the [facility] - PR3:  . antibody less than 0.2 and normal throughout 2019 but in July 2019 up at 0.06 June 2018 it is up at 0.5 -she is worried about this -MPO -October 2019 [facility] were done at [facility] long: < 0.2 normal -CD20-B-cell percent - 1% (40)  prior to 2017 and low but now 6% (119) and ability to start or do it put additional thank you   telphone juluy 2020 Updated 6:14 PM 04/03/2019 - spoke to Theresa [name] - says not spoken to Dr [name]. - has appt 04/24/2019.  Went to [facility] - for sinus issues 2-3 months - Rx amoxicillin/prednisone but 2nd ddx is vasculitis she says per RN. Also having fatigue worse.  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 Nasal swab 6/3-0/2020 - negative. Says overall is moderate. 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   - suggested she try to resolve with [facility] by  04/14/2019   Telephone sept 2020 Called [address] Samson Frederic [name] 1:24 PM 06/03/2019 - wanted  To update me  Said she did virtual visit with [facility]. Labs look worse. Dr Leanne Lovely wants to see her on site at [facility] in [place] and this will be in late October. Also going to see ENT. Has been having epistaxis. Otherwise well. Says Dr Hilarie Fredrickson planning UGI endoscopy in 2 days 06/05/2019 to rule out Barretts. (neg covid June 2020). Dr Hilarie Fredrickson decideing [facility]th [facility] and waiting to hear from anesthesia. Patient says she had laryngospasm issue x 1 of many times before - Dr Hilarie Fredrickson deciding on location based on this history   OV 09/10/2019- telephone visit  Chief Complaint  Patient presents with  . Follow-up    no symptoms, flare  up of Wegener's granulamatosis   On 07/15/2019 - Has now seen Dr Mariane Baumgarten [facility] via telephoine  - he has recommended covid vaccine due to B cell depletion from Rituxan - suppressed for years but had re-emerged. So he thought if she took Rituxan again it would deplete B cells. He felt vasculitis has relapsed. She was on prednisone $RemoveBefor'20mg'LpUuNizvjEwA$  per day and helping. Strated on cellcept as alternative to rituxan.currently on that, prednisone and bactrim. Plan is to u se this and get covid vaccine and then go to Rituxan. Having side effects - feeling poorly , gained6#, feeling groggy.  Then saw [place] [facility] [facility] 08/08/2019 as part of establishing a new team with MD attrition due to relocation/retirement over time.  SAw Dr Ezzard Flax. Has recommended budesonide and tobra irrigations and followup in 2 months. Followed with them Dr Patrice Paradise  Sep 08, 2019 - > felt airway stenosis stable. Dx Is chronic pansinusitis, subgloticc stenosis, and muscle tension dysphonia   We discussed covid vaccine and Mab rx  ROS - per HPI    OV 06/17/2020  Subjective:  Patient ID: Theresa [name], female , DOB: 07/19/1961 , age 59 y.o. , MRN: [id number] , ADDRESS: [place] [address] PCP Antony Contras, MD Patient Care Team: Antony Contras, MD as PCP - General (Family Medicine) Brand Males, MD as Consulting Physician (Pulmonary Disease) Delrae Rend, MD as Consulting Physician (Endocrinology) Magrinat, Virgie Dad, MD as Consulting Physician (Oncology) Ernestine Conrad, MD as Referring Physician (Otolaryngology) Clinger, Chrystie Nose, MD (Otolaryngology) Pedro Earls, MD as Attending Physician (Family Medicine) Don Broach, MD as Referring Physician (Internal Medicine) Pyrtle, Lajuan Lines, MD as Consulting Physician (Gastroenterology) Lanice Schwab, DO as Referring Physician (Rheumatology)  This Provider for this visit: Treatment Team:  Attending Provider: Brand Males, MD    06/17/2020 -   Chief Complaint  Patient presents with  . Follow-up    Granulomatosis with polyangiitis, doing well     HPI Theresa [name] 59 y.o. -returns for follow-up.  She continues to do well.  Since the last telephone visit in January she has followed up with [facility].  They are August 2021 notes are indicated below.  She was on CellCept but she stopped it.  This because of fatigue and then the fatigue resolved.  In September 2020 when she had epistaxis that was cauterized at the [facility].  She confirmed this and I reviewed the note.  At this point in time she was not fully sure what the [place] for her Rituxan was.  I have read this instructions from [facility] and given it to her.  From a pulmonary standpoint overall she is feeling well.  She says that she has thyroid nodule biopsy coming up.  She also has some maxillary sinus surgery coming up.  At this point in time she has had a second dose shingles vaccine she is up-to-date with all her vaccination.  She communicates with the [facility] through my chart.  The next visit scheduled is April 2022.  I suggested that she schedule video visits may be on a more frequent pattern with [facility]  for any questions and overall monitoring that she might need.    ROS - per HPI     has a past medical history of Allergy, Anxiety, Anxiety state (07/06/2014), Arthritis, Calyceal diverticulum of kidney, Cholelithiasis, Diverticulosis, DJD (degenerative joint disease) (07/07/2014), Dyslipidemia, Family history of breast cancer in sister (07/07/2014),  Gastritis, GERD (gastroesophageal reflux disease), Goiter, nontoxic, multinodular  per pt history  (12/07/2014), Granulomatosis with polyangiitis ([place]) (03/24/2009), Headaches, cluster, Heart murmur, MRSA infection, Insomnia  off BZP now  (07/06/2014), Migraine headache (07/06/2014), Mitral valve prolapse, Non-seasonal allergic rhinitis, Osteoporosis, Sleep disorder (10/20/2014), Thyroid disease, Tinnitus, Varicose veins, Varicose veins of lower extremities with complications (03/08/1024), Wegener's granulomatosis (1998), and Wegener's granulomatosis (granulomatosis with polyangiitis) (01/24/2015).   reports that she has never smoked. She has never used smokeless tobacco.  Past Surgical History:  Procedure Laterality Date  . AIRWAY FOREIGN BODY REMOVAL     with tracheostomy  . COLONOSCOPY    . ENDOVENOUS ABLATION SAPHENOUS VEIN W/ LASER Left 06-03-2015   endovenous laser ablation left greater saphenous vein and stab phlebectomy 10-20 incisions left leg by Curt Jews MD  . ENDOVENOUS ABLATION SAPHENOUS VEIN W/ LASER Right 06-24-2015   endovenous laser ablation right greater saphenous vein and stab phlebectomy 10-20 incisions   . LEFT MENISCUS    . MINOR DEVIATED SEPTUM    . NASAL SINUS SURGERY    . TEAR DUCT PROBING Right    x 2  . TEAR DUCT PROBING Left   . TRACHEOSTOMY CLOSURE     over 30 surgeries  . UPPER GASTROINTESTINAL ENDOSCOPY      Allergies  Allergen Reactions  . Codeine Itching  . Eletriptan Nausea Only  . Vancomycin Hives  . Tape Rash    Immunization History  Administered Date(s) Administered  . Hep B / HiB 11/02/2013  .  Influenza Split 06/18/2003, 05/16/2012, 06/04/2012, 05/19/2013, 06/04/2013, 05/19/2014  . Influenza, High Dose Seasonal PF 05/24/2018, 06/09/2019  . Influenza, Seasonal, Injecte, Preservative Fre 06/04/2012, 05/19/2013, 05/29/2014  . Influenza,inj,Quad PF,6+ Mos 07/20/2015  . Influenza,inj,Quad PF,6-35 Mos 06/05/2019  . Influenza-Unspecified 05/20/2012, 05/27/2013, 05/20/2014, 07/20/2015, 06/07/2017  . PFIZER SARS-COV-2 Vaccination 10/28/2019, 11/18/2019, 05/01/2020  . Pneumococcal Conjugate-13 06/18/2014  . Pneumococcal Polysaccharide-23 06/28/2004, 06/19/2007, 07/16/2012, 11/10/2015  . Pneumococcal-Unspecified 06/04/2012  . Tdap 11/01/2005, 11/03/2010, 01/09/2012  . Zoster Recombinat (Shingrix) 01/26/2020    Family History  Problem Relation Age of Onset  . Asthma Daughter   . Irritable bowel syndrome Daughter   . Irritable bowel syndrome Sister   . Other Sister        Chiropractor   . CAD Father   . Skin cancer Father   . Heart disease Father   . Osteoporosis Father   . COPD Father   . Prostate cancer Father   . Breast cancer Sister   . Irritable bowel syndrome Sister   . Ulcerative colitis Sister   . Hypertension Mother   . Osteoporosis Mother   . Breast cancer Mother 59       IDC  . Asthma Sister   . Other Sister        PVC's  . Colon cancer Neg Hx   . Pancreatic cancer Neg Hx   . Stomach cancer Neg Hx   . Liver disease Neg Hx   . Esophageal cancer Neg Hx   . Rectal cancer Neg Hx      Current Outpatient Medications:  .  albuterol (PROVENTIL HFA;VENTOLIN HFA) 108 (90 BASE) MCG/ACT inhaler, Inhale 2 puffs into the lungs every 6 (six) hours as needed. For shortness of breath or wheezing, Disp: , Rfl:  .  denosumab (PROLIA) 60 MG/ML SOSY injection, Inject 60 mg into the skin every 6 (six) months., Disp: , Rfl:  .  dexlansoprazole (DEXILANT) 60 MG capsule, Take 1 capsule (60 mg total) by  mouth 2 (two) times daily., Disp: 180 capsule, Rfl: 3 .  fluticasone  (FLONASE) 50 MCG/ACT nasal spray, Place 2 sprays into the nose daily., Disp: , Rfl:  .  Hypertonic Nasal Wash (SINUS RINSE BOTTLE KIT NA), Place into the nose. Adding Topamax and Budesomibe added to rinse, Disp: , Rfl:  .  Lactobacillus Rhamnosus, GG, (CULTURELLE) CAPS, Take by mouth 1 day or 1 dose., Disp: , Rfl:  .  LORazepam (ATIVAN) 1 MG tablet, Take 1 tablet (1 mg total) by mouth 2 (two) times daily as needed for anxiety., Disp: 60 tablet, Rfl: 1 .  Multiple Vitamin (MULTIVITAMIN) tablet, Take 1 tablet by mouth daily., Disp: , Rfl:  .  Multiple Vitamins-Minerals (PRESERVISION AREDS 2 PO), Take 1 tablet by mouth daily. , Disp: , Rfl:  .  predniSONE (DELTASONE) 5 MG tablet, Take 1 tablet (5 mg total) by mouth daily with breakfast., Disp: , Rfl:  .  sertraline (ZOLOFT) 25 MG tablet, Take 25 mg by mouth daily., Disp: , Rfl:  .  sulfamethoxazole-trimethoprim (BACTRIM) 400-80 MG tablet, Take 1 tablet by mouth daily., Disp: , Rfl:  .  zolpidem (AMBIEN) 10 MG tablet, Take one tablet hs prn.  Do not take with Ativan, Disp: 30 tablet, Rfl: 1      Objective:   Vitals:   06/17/20 0916  BP: 122/74  Pulse: 69  Temp: (!) 96.9 F (36.1 C)  TempSrc: Oral  SpO2: 99%  Weight: 161 lb 1.6 oz (73.1 kg)  Height: $Remove'5\' 6"'OpSTtTx$  (1.676 m)    Estimated body mass index is 26 kg/m as calculated from the following:   Height as of this encounter: $RemoveBeforeD'5\' 6"'pZrVnwKWReJKxv$  (1.676 m).   Weight as of this encounter: 161 lb 1.6 oz (73.1 kg).  $Rem'@WEIGHTCHANGE'dsLs$ @  [employer]   06/17/20 0916  Weight: 161 lb 1.6 oz (73.1 kg)     Physical Exam   General: No distress. Looks well Neuro: Alert and Oriented x 3. GCS 15. Speech normal Psych: Pleasant Resp:  Barrel Chest - no.  Wheeze - no, Crackles - no, No overt respiratory distress CVS: Normal heart sounds. Murmurs - no Ext: Stigmata of Connective Tissue Disease - no HEENT: Normal upper airway. PEERL +. No post nasal drip        Assessment:       ICD-10-CM   1. 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 Granulomatosis with polyangiitis, unspecified whether renal involvement ([place])  M31.30     Continue care plan outlined by [facility] Per their note in august 2021  -  Vasculitis plan of care going forward is: - Prednisone$RemoveBeforeD'5mg'skDCHapdXVHAYI$  daily  - Bactrim SS daily for PJP prophylaxis -  Continue to hold CellCept (500 mg TWICE daily - held since 03/07/2020) - Discontinue monthly medication monitoring - No need to monitor B-cells or ANCA  -Shingrix vaccineseries- 1st dose 01/26/2020,2nd dose PENDING - Observe and only retreat with rituximab if new symptoms arise that clearly indicate a disease relapse -Annual return due April 2022   Plan  - routine 30 min followup in 6-9 months - on-site visit - keep up with [facility] and [facility]  = recommend scheduled video visits with [facility] atleast every 3-9 months per your and their mutual discussion          SIGNATURE    Dr. Brand Males, M.D.,  F.C.C.P,  Pulmonary and Critical Care Medicine Staff Physician, [place] Director - Interstitial Lung Disease  Program  P[facility] [place] at [place], [place], [address]  Pager: [phone], If no answer or between  15:00h - 7:00h: call [phone] Telephone: [phone]  9:43 AM 06/17/2020

## 2020-06-22 ENCOUNTER — Ambulatory Visit
Admission: RE | Admit: 2020-06-22 | Discharge: 2020-06-22 | Disposition: A | Discharge status: Home / Self Care | Payer: Medicare Other | Source: Ambulatory Visit | Attending: Internal Medicine | Admitting: Internal Medicine

## 2020-06-22 ENCOUNTER — Other Ambulatory Visit: Payer: Self-pay | Admitting: Internal Medicine

## 2020-06-22 DIAGNOSIS — E041 Nontoxic single thyroid nodule: Secondary | ICD-10-CM

## 2020-07-02 ENCOUNTER — Telehealth: Payer: Self-pay | Admitting: Internal Medicine

## 2020-07-02 NOTE — Telephone Encounter (Signed)
Pt states she was put on zoloft by her PCP and she thinks that it really messed her stomach up. She has issues start when she was placed on the zoloft. Pt is taking her dexilant and probiotic and that has helped some. She is having diarrhea but does not want to take imodium as she does not want to become constipated. Discussed with pt that she could try IB guard OTC as the box states. Pt will try this and she knows to call back if she has any other issues.

## 2020-07-06 ENCOUNTER — Telehealth: Payer: Self-pay | Admitting: Internal Medicine

## 2020-07-06 NOTE — Telephone Encounter (Signed)
See note below from last week. Pt calling today stating that the diarrhea has subsided and now her troubles are with her stomach. Reports she is having more reflux related symptoms. Recommended IB guard last week to the pt but she did not take it, states she just doesn't want to take a lot of meds. Pt wants to know what Dr. Hilarie Fredrickson suggests. Please advise.

## 2020-07-06 NOTE — Telephone Encounter (Signed)
Please let patient know that sertraline can have GI related side effects particularly when the medication is for started.  Fortunately these almost always improve.  Sertraline can be associated with loose stools. She does have a history of reflux and I would recommend that she continue her Dexilant 60 mg a day.  If she is having breakthrough indigestion symptoms she can add famotidine 20 mg in the evening or before bedtime.  If this flare is related to sertraline I would expect things to improve after 1 to 2 weeks of therapy but if not she should make the prescribing provider of her sertraline aware of her side effects. If symptoms fail to improve she should let me know, but I would not start any new medications at this point

## 2020-07-06 NOTE — Telephone Encounter (Signed)
Left message for pt to call back  °

## 2020-07-06 NOTE — Telephone Encounter (Signed)
Pls call pt. She would like to f/u with you about this conversation. She wants to know Dr. Vena Rua recommendations to solve her problem.

## 2020-07-06 NOTE — Telephone Encounter (Signed)
Left message on machine to call back  

## 2020-07-08 ENCOUNTER — Encounter: Payer: Self-pay | Admitting: Oncology

## 2020-07-08 NOTE — Telephone Encounter (Signed)
Have left message for pt to call back but have not heard back from. Her. Message sent to pt regarding Dr. Zettie Cooley recommendations through [facility].

## 2020-07-12 ENCOUNTER — Inpatient Hospital Stay: Payer: Medicare Other

## 2020-07-12 ENCOUNTER — Other Ambulatory Visit: Payer: Self-pay

## 2020-07-12 ENCOUNTER — Inpatient Hospital Stay: Payer: Medicare Other | Attending: Oncology

## 2020-07-12 VITALS — BP 118/81 | HR 56 | Temp 98.4°F | Resp 18

## 2020-07-12 DIAGNOSIS — M3130 Wegener's granulomatosis without renal involvement: Secondary | ICD-10-CM | POA: Insufficient documentation

## 2020-07-12 DIAGNOSIS — Z23 Encounter for immunization: Secondary | ICD-10-CM | POA: Insufficient documentation

## 2020-07-12 DIAGNOSIS — M810 Age-related osteoporosis without current pathological fracture: Secondary | ICD-10-CM

## 2020-07-12 LAB — CBC WITH DIFFERENTIAL/PLATELET
Abs Immature Granulocytes: 0.03 10*3/uL (ref 0.00–0.07)
Basophils Absolute: 0.1 10*3/uL (ref 0.0–0.1)
Basophils Relative: 1 %
Eosinophils Absolute: 0.1 10*3/uL (ref 0.0–0.5)
Eosinophils Relative: 2 %
HCT: 42.9 % (ref 36.0–46.0)
Hemoglobin: 13.7 g/dL (ref 12.0–15.0)
Immature Granulocytes: 0 %
Lymphocytes Relative: 25 %
Lymphs Abs: 2 10*3/uL (ref 0.7–4.0)
MCH: 29.7 pg (ref 26.0–34.0)
MCHC: 31.9 g/dL (ref 30.0–36.0)
MCV: 92.9 fL (ref 80.0–100.0)
Monocytes Absolute: 0.8 10*3/uL (ref 0.1–1.0)
Monocytes Relative: 10 %
Neutro Abs: 4.9 10*3/uL (ref 1.7–7.7)
Neutrophils Relative %: 62 %
Platelets: 314 10*3/uL (ref 150–400)
RBC: 4.62 MIL/uL (ref 3.87–5.11)
RDW: 13.6 % (ref 11.5–15.5)
WBC: 7.8 10*3/uL (ref 4.0–10.5)
nRBC: 0 % (ref 0.0–0.2)

## 2020-07-12 LAB — COMPREHENSIVE METABOLIC PANEL
ALT: 17 U/L (ref 0–44)
AST: 16 U/L (ref 15–41)
Albumin: 3.7 g/dL (ref 3.5–5.0)
Alkaline Phosphatase: 41 U/L (ref 38–126)
Anion gap: 7 (ref 5–15)
BUN: 18 mg/dL (ref 6–20)
CO2: 26 mmol/L (ref 22–32)
Calcium: 9.4 mg/dL (ref 8.9–10.3)
Chloride: 109 mmol/L (ref 98–111)
Creatinine, Ser: 0.98 mg/dL (ref 0.44–1.00)
GFR, Estimated: >60 mL/min (ref >60)
Glucose, Bld: 75 mg/dL (ref 70–99)
Potassium: 4.4 mmol/L (ref 3.5–5.1)
Sodium: 142 mmol/L (ref 135–145)
Total Bilirubin: 0.5 mg/dL (ref 0.3–1.2)
Total Protein: 6.7 g/dL (ref 6.5–8.1)

## 2020-07-12 MED ORDER — INFLUENZA VAC SPLIT QUAD 0.5 ML IM SUSY
PREFILLED_SYRINGE | INTRAMUSCULAR | Status: AC
  Filled 2020-07-12: qty 0.5

## 2020-07-12 MED ORDER — INFLUENZA VAC SPLIT QUAD 0.5 ML IM SUSY: 0.5000 mL | PREFILLED_SYRINGE | Freq: Once | INTRAMUSCULAR | Status: DC

## 2020-07-12 MED ORDER — INFLUENZA VAC A&B SA ADJ QUAD 0.5 ML IM PRSY
0.5000 mL | PREFILLED_SYRINGE | Freq: Once | INTRAMUSCULAR | Status: AC
  Administered 2020-07-12: 0.5 mL via INTRAMUSCULAR

## 2020-07-12 MED ORDER — INFLUENZA VAC A&B SA ADJ QUAD 0.5 ML IM PRSY
PREFILLED_SYRINGE | INTRAMUSCULAR | Status: AC
  Filled 2020-07-12: qty 0.5

## 2020-07-12 MED ORDER — DENOSUMAB 60 MG/ML ~~LOC~~ SOSY: 60.0000 mg | PREFILLED_SYRINGE | Freq: Once | SUBCUTANEOUS | Status: DC

## 2020-07-12 NOTE — Patient Instructions (Addendum)
Influenza Virus Vaccine injection What is this medicine? INFLUENZA VIRUS VACCINE (in floo EN zuh VAHY ruhs vak SEEN) helps to reduce the risk of getting influenza also known as the flu. The vaccine only helps protect you against some strains of the flu. This medicine may be used for other purposes; ask your health care provider or pharmacist if you have questions. COMMON BRAND NAME(S): Afluria, Afluria Quadrivalent, Agriflu, Alfuria, FLUAD, Fluarix, Fluarix Quadrivalent, Flublok, Flublok Quadrivalent, FLUCELVAX, FLUCELVAX Quadrivalent, Flulaval, Flulaval Quadrivalent, Fluvirin, Fluzone, Fluzone High-Dose, Fluzone Intradermal, Fluzone Quadrivalent What should I tell my health care provider before I take this medicine? They need to know if you have any of these conditions:  bleeding disorder like hemophilia  fever or infection  Guillain-Barre syndrome or other neurological problems  immune system problems  infection with the human immunodeficiency virus (HIV) or AIDS  low blood platelet counts  multiple sclerosis  an unusual or allergic reaction to influenza virus vaccine, latex, other medicines, foods, dyes, or preservatives. Different brands of vaccines contain different allergens. Some may contain latex or eggs. Talk to your doctor about your allergies to make sure that you get the right vaccine.  pregnant or trying to get pregnant  breast-feeding How should I use this medicine? This vaccine is for injection into a muscle or under the skin. It is given by a health care professional. A copy of Vaccine Information Statements will be given before each vaccination. Read this sheet carefully each time. The sheet may change frequently. Talk to your healthcare provider to see which vaccines are right for you. Some vaccines should not be used in all age groups. Overdosage: If you think you have taken too much of this medicine contact a poison control center or emergency room at once. NOTE:  This medicine is only for you. Do not share this medicine with others. What if I miss a dose? This does not apply. What may interact with this medicine?  chemotherapy or radiation therapy  medicines that lower your immune system like etanercept, anakinra, infliximab, and adalimumab  medicines that treat or prevent blood clots like warfarin  phenytoin  steroid medicines like prednisone or cortisone  theophylline  vaccines This list may not describe all possible interactions. Give your health care provider a list of all the medicines, herbs, non-prescription drugs, or dietary supplements you use. Also tell them if you smoke, drink alcohol, or use illegal drugs. Some items may interact with your medicine. What should I watch for while using this medicine? Report any side effects that do not go away within 3 days to your doctor or health care professional. Call your health care provider if any unusual symptoms occur within 6 weeks of receiving this vaccine. You may still catch the flu, but the illness is not usually as bad. You cannot get the flu from the vaccine. The vaccine will not protect against colds or other illnesses that may cause fever. The vaccine is needed every year. What side effects may I notice from receiving this medicine? Side effects that you should report to your doctor or health care professional as soon as possible:  allergic reactions like skin rash, itching or hives, swelling of the face, lips, or tongue Side effects that usually do not require medical attention (report to your doctor or health care professional if they continue or are bothersome):  fever  headache  muscle aches and pains  pain, tenderness, redness, or swelling at the injection site  tiredness This list may not describe  all possible side effects. Call your doctor for medical advice about side effects. You may report side effects to FDA at 1-800-FDA-1088. Where should I keep my medicine? The  vaccine will be given by a health care professional in a clinic, pharmacy, doctor's office, or other health care setting. You will not be given vaccine doses to store at home. NOTE: This sheet is a summary. It may not cover all possible information. If you have questions about this medicine, talk to your doctor, pharmacist, or health care provider.  2020 Elsevier/Gold Standard (2018-07-16 08:45:43)  

## 2020-08-08 ENCOUNTER — Encounter: Payer: Self-pay | Admitting: Oncology

## 2020-08-09 ENCOUNTER — Inpatient Hospital Stay: Payer: Medicare Other

## 2020-08-09 DIAGNOSIS — J42 Unspecified chronic bronchitis: Secondary | ICD-10-CM

## 2020-08-09 NOTE — Telephone Encounter (Signed)
Dr. [name] will please advise on the following My Chart message: Hi Dr. Chase Caller- I hope you are well. Would you mind ordering me a nebulizer and tubing kit. I believe Medicare will pay for this. With the change in weather I need to use one especially in the winter. My previous one from years ago is not working correctly. My ent physician at a [facility] suggested I ask you for the prescription with the diagnosis code attached.  Thank you! Theresa [name] Thank you

## 2020-08-10 ENCOUNTER — Telehealth: Payer: Self-pay | Admitting: Oncology

## 2020-08-10 NOTE — Telephone Encounter (Signed)
Scheduled apt per 12/6 sch msg - scheduled for 1/4 - pt aware - pt requested next available  with MD

## 2020-08-11 NOTE — Telephone Encounter (Signed)
Triage  Please order albuterol 2.35mL - once daily as needed as nebulizer  Order machine and tubing kit Dx: obstructive airways

## 2020-09-07 ENCOUNTER — Inpatient Hospital Stay: Payer: Medicare Other | Attending: Oncology

## 2020-09-07 ENCOUNTER — Inpatient Hospital Stay (HOSPITAL_BASED_OUTPATIENT_CLINIC_OR_DEPARTMENT_OTHER): Payer: Medicare Other | Admitting: Oncology

## 2020-09-07 ENCOUNTER — Other Ambulatory Visit: Payer: Self-pay

## 2020-09-07 ENCOUNTER — Inpatient Hospital Stay: Payer: Medicare Other

## 2020-09-07 ENCOUNTER — Encounter: Payer: Self-pay | Admitting: General Practice

## 2020-09-07 VITALS — BP 115/78 | HR 63 | Temp 98.1°F | Resp 18 | Ht 66.0 in | Wt 159.7 lb

## 2020-09-07 DIAGNOSIS — M818 Other osteoporosis without current pathological fracture: Secondary | ICD-10-CM | POA: Diagnosis not present

## 2020-09-07 DIAGNOSIS — Z79899 Other long term (current) drug therapy: Secondary | ICD-10-CM | POA: Diagnosis not present

## 2020-09-07 DIAGNOSIS — M816 Localized osteoporosis [Lequesne]: Secondary | ICD-10-CM | POA: Diagnosis not present

## 2020-09-07 DIAGNOSIS — M3130 Wegener's granulomatosis without renal involvement: Secondary | ICD-10-CM | POA: Insufficient documentation

## 2020-09-07 DIAGNOSIS — M810 Age-related osteoporosis without current pathological fracture: Secondary | ICD-10-CM

## 2020-09-07 LAB — CBC WITH DIFFERENTIAL/PLATELET
Abs Immature Granulocytes: 0.03 10*3/uL (ref 0.00–0.07)
Basophils Absolute: 0.1 10*3/uL (ref 0.0–0.1)
Basophils Relative: 1 %
Eosinophils Absolute: 0.1 10*3/uL (ref 0.0–0.5)
Eosinophils Relative: 1 %
HCT: 41.8 % (ref 36.0–46.0)
Hemoglobin: 13.4 g/dL (ref 12.0–15.0)
Immature Granulocytes: 0 %
Lymphocytes Relative: 18 %
Lymphs Abs: 1.7 10*3/uL (ref 0.7–4.0)
MCH: 29.6 pg (ref 26.0–34.0)
MCHC: 32.1 g/dL (ref 30.0–36.0)
MCV: 92.3 fL (ref 80.0–100.0)
Monocytes Absolute: 0.7 10*3/uL (ref 0.1–1.0)
Monocytes Relative: 7 %
Neutro Abs: 6.7 10*3/uL (ref 1.7–7.7)
Neutrophils Relative %: 73 %
Platelets: 314 10*3/uL (ref 150–400)
RBC: 4.53 MIL/uL (ref 3.87–5.11)
RDW: 14.1 % (ref 11.5–15.5)
WBC: 9.2 10*3/uL (ref 4.0–10.5)
nRBC: 0 % (ref 0.0–0.2)

## 2020-09-07 LAB — COMPREHENSIVE METABOLIC PANEL
ALT: 18 U/L (ref 0–44)
AST: 17 U/L (ref 15–41)
Albumin: 3.7 g/dL (ref 3.5–5.0)
Alkaline Phosphatase: 41 U/L (ref 38–126)
Anion gap: 8 (ref 5–15)
BUN: 20 mg/dL (ref 6–20)
CO2: 25 mmol/L (ref 22–32)
Calcium: 9.5 mg/dL (ref 8.9–10.3)
Chloride: 107 mmol/L (ref 98–111)
Creatinine, Ser: 0.97 mg/dL (ref 0.44–1.00)
GFR, Estimated: >60 mL/min (ref >60)
Glucose, Bld: 77 mg/dL (ref 70–99)
Potassium: 4.4 mmol/L (ref 3.5–5.1)
Sodium: 140 mmol/L (ref 135–145)
Total Bilirubin: 0.4 mg/dL (ref 0.3–1.2)
Total Protein: 7 g/dL (ref 6.5–8.1)

## 2020-09-07 MED ORDER — DENOSUMAB 60 MG/ML ~~LOC~~ SOSY
60.0000 mg | PREFILLED_SYRINGE | Freq: Once | SUBCUTANEOUS | Status: AC
  Administered 2020-09-07: 60 mg via SUBCUTANEOUS

## 2020-09-07 MED ORDER — DENOSUMAB 60 MG/ML ~~LOC~~ SOSY
PREFILLED_SYRINGE | SUBCUTANEOUS | Status: AC
  Filled 2020-09-07: qty 1

## 2020-09-07 NOTE — Patient Instructions (Signed)
Denosumab injection What is this medicine? DENOSUMAB (den oh sue mab) slows bone breakdown. Prolia is used to treat osteoporosis in women after menopause and in men, and in people who are taking corticosteroids for 6 months or more. Xgeva is used to treat a high calcium level due to cancer and to prevent bone fractures and other bone problems caused by multiple myeloma or cancer bone metastases. Xgeva is also used to treat giant cell tumor of the bone. This medicine may be used for other purposes; ask your health care provider or pharmacist if you have questions. COMMON BRAND NAME(S): Prolia, XGEVA What should I tell my health care provider before I take this medicine? They need to know if you have any of these conditions:  dental disease  having surgery or tooth extraction  infection  kidney disease  low levels of calcium or Vitamin D in the blood  malnutrition  on hemodialysis  skin conditions or sensitivity  thyroid or parathyroid disease  an unusual reaction to denosumab, other medicines, foods, dyes, or preservatives  pregnant or trying to get pregnant  breast-feeding How should I use this medicine? This medicine is for injection under the skin. It is given by a health care professional in a hospital or clinic setting. A special MedGuide will be given to you before each treatment. Be sure to read this information carefully each time. For Prolia, talk to your pediatrician regarding the use of this medicine in children. Special care may be needed. For Xgeva, talk to your pediatrician regarding the use of this medicine in children. While this drug may be prescribed for children as young as 13 years for selected conditions, precautions do apply. Overdosage: If you think you have taken too much of this medicine contact a poison control center or emergency room at once. NOTE: This medicine is only for you. Do not share this medicine with others. What if I miss a dose? It is  important not to miss your dose. Call your doctor or health care professional if you are unable to keep an appointment. What may interact with this medicine? Do not take this medicine with any of the following medications:  other medicines containing denosumab This medicine may also interact with the following medications:  medicines that lower your chance of fighting infection  steroid medicines like prednisone or cortisone This list may not describe all possible interactions. Give your health care provider a list of all the medicines, herbs, non-prescription drugs, or dietary supplements you use. Also tell them if you smoke, drink alcohol, or use illegal drugs. Some items may interact with your medicine. What should I watch for while using this medicine? Visit your doctor or health care professional for regular checks on your progress. Your doctor or health care professional may order blood tests and other tests to see how you are doing. Call your doctor or health care professional for advice if you get a fever, chills or sore throat, or other symptoms of a cold or flu. Do not treat yourself. This drug may decrease your body's ability to fight infection. Try to avoid being around people who are sick. You should make sure you get enough calcium and vitamin D while you are taking this medicine, unless your doctor tells you not to. Discuss the foods you eat and the vitamins you take with your health care professional. See your dentist regularly. Brush and floss your teeth as directed. Before you have any dental work done, tell your dentist you are   receiving this medicine. Do not become pregnant while taking this medicine or for 5 months after stopping it. Talk with your doctor or health care professional about your birth control options while taking this medicine. Women should inform their doctor if they wish to become pregnant or think they might be pregnant. There is a potential for serious side  effects to an unborn child. Talk to your health care professional or pharmacist for more information. What side effects may I notice from receiving this medicine? Side effects that you should report to your doctor or health care professional as soon as possible:  allergic reactions like skin rash, itching or hives, swelling of the face, lips, or tongue  bone pain  breathing problems  dizziness  jaw pain, especially after dental work  redness, blistering, peeling of the skin  signs and symptoms of infection like fever or chills; cough; sore throat; pain or trouble passing urine  signs of low calcium like fast heartbeat, muscle cramps or muscle pain; pain, tingling, numbness in the hands or feet; seizures  unusual bleeding or bruising  unusually weak or tired Side effects that usually do not require medical attention (report to your doctor or health care professional if they continue or are bothersome):  constipation  diarrhea  headache  joint pain  loss of appetite  muscle pain  runny nose  tiredness  upset stomach This list may not describe all possible side effects. Call your doctor for medical advice about side effects. You may report side effects to FDA at 1-800-FDA-1088. Where should I keep my medicine? This medicine is only given in a clinic, doctor's office, or other health care setting and will not be stored at home. NOTE: This sheet is a summary. It may not cover all possible information. If you have questions about this medicine, talk to your doctor, pharmacist, or health care provider.  2020 Elsevier/Gold Standard (2017-12-28 16:10:44)

## 2020-09-07 NOTE — Progress Notes (Signed)
[facility] Spiritual Care Note  Met with Theresa [name] in my office following her other appointments, providing opportunity for her to share and process family and job-search updates. She is practicing a positive mindset and engaging as much as possible, which helps her feel a sense of agency and trust that success will come. Provided pastoral presence, reflective listening, emotional support, and affirmation of strengths. Theresa [name] knows to reach out whenever needed/desired for further chaplain support.   [place], [place], [facility] Pager [phone] Voicemail [phone]

## 2020-09-07 NOTE — Progress Notes (Signed)
ID: [name]  DOB: Oct 07, 1960  MR#: [id number]  CSN#: [id number]  Patient Care Team: Antony Contras, MD as PCP - General (Family Medicine) Brand Males, MD as Consulting Physician (Pulmonary Disease) Delrae Rend, MD as Consulting Physician (Endocrinology) Ronold Hardgrove, Virgie Dad, MD as Consulting Physician (Oncology) Ernestine Conrad, MD as Referring Physician (Otolaryngology) Clinger, Chrystie Nose, MD (Otolaryngology) Pedro Earls, MD as Attending Physician (Family Medicine) Don Broach, MD as Referring Physician (Internal Medicine) Pyrtle, Lajuan Lines, MD as Consulting Physician (Gastroenterology) Lanice Schwab, DO as Referring Physician (Rheumatology) OTHER MD: [name] (fax [phone]); Jaquita Rector; Danton Sewer; Kandis Ban; Huey Romans  CHIEF COMPLAINT: Wegener's granulomatosis  CURRENT TREATMENT: per doctors Specks and Ramaswamy   INTERVAL HISTORY:   Kory returns today for 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  She continues to be followed through the [facility] under Dr [name], although she tells me there have been no contact for the past 6 months.  She also sees Dr. Chase Caller here as a pulmonologist and Dr. Hilarie Fredrickson as her GI doctor  Dr. Mariane Baumgarten started her on Bactrim daily beginning 07/07/2019 for pneumocystis prevention, and prednisone on a taper, currently at 5 mg daily.  She also uses a tobramycin and budesonide nasal wash.  She was started on CellCept on 08/11/2019, but finally went off that medication as of July 2021.  Meanwhile she continues on Prolia with good tolerance.  Her most recent dose was 02/05/2020.  She has a dose due today.  Most recent bone density 11/11/2019 shows a T score of -2.5, which is stable to improved  Since her last visit, she underwent bilateral screening mammography with tomography at [facility] on 02/17/2020 showing: breast density category C; no evidence of malignancy in either breast.   She also  underwent thyroid ultrasound on 05/21/2020 showing a 2 cm right inferior nodule. She returned for biopsy on 06/22/2020, but repeat ultrasound found the nodule to represent a pseudo nodule.   REVIEW OF SYSTEMS:  Lugene exercises every day with a treadmill that her children gave her.  She also does some weights.  She is actively looking for work at this point.  A detailed review of systems is otherwise stable   COVID 19 VACCINATION STATUS: fully vaccinated [employer]), with booster on 05/01/2020   HISTORY OF WEGENER'S GRANULOMATOSIS:  From the original intake note:  [name] tells me her diagnosis of Wegener was initially made at the [facility] by Dr. [name] in 1999.  She has been treated in the past with Cytoxan and prednisone, methotrexate very briefly, Cytoxan orally, then Imuran for some time, and then off treatment for some years.  She was started on prednisone alone in 2009, and then in 06/2008 the azathioprine was added again at 150 mg daily, and variable doses of prednisone.  It is felt that she failed remission maintenance therapy since she could not reduce her dose of prednisone below 20 mg daily without having a resurgence of symptoms, and therefore she has been started on Rituxan with the first dose given at the [facility] on 06/24.  The patient tolerated the treatment quite well.    Her subsequent history is as detailed below   PAST MEDICAL HISTORY: Past Medical History:  Diagnosis Date  . Allergy   . Anxiety   . Anxiety state 07/06/2014  . Arthritis   . Calyceal diverticulum of kidney   . Cholelithiasis   . Diverticulosis   . DJD (degenerative joint disease) 07/07/2014  .  Dyslipidemia   . Family history of breast cancer in sister 07/07/2014  . Gastritis   . GERD (gastroesophageal reflux disease)   . Goiter, nontoxic, multinodular  per pt history  12/07/2014  . 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 Headaches,  cluster   . Heart murmur   . Hx MRSA infection   . Insomnia  off BZP now  07/06/2014  . Migraine headache 07/06/2014  . Mitral valve prolapse   . Non-seasonal allergic rhinitis   . Osteoporosis   . Sleep disorder 10/20/2014  . Thyroid disease    Thyroid nodules  . Tinnitus   . Varicose veins   . Varicose veins of lower extremities with complications 10/06/2014  . Wegener's granulomatosis 1998   autoimmune vasculitits  . Wegener's granulomatosis (granulomatosis with polyangiitis) 01/24/2015  history of migraines   SURGICAL HISTORY: Past Surgical History:  Procedure Laterality Date  . AIRWAY FOREIGN BODY REMOVAL     with tracheostomy  . COLONOSCOPY    . ENDOVENOUS ABLATION SAPHENOUS VEIN W/ LASER Left 06-03-2015   endovenous laser ablation left greater saphenous vein and stab phlebectomy 10-20 incisions left leg by Gretta Began MD  . ENDOVENOUS ABLATION SAPHENOUS VEIN W/ LASER Right 06-24-2015   endovenous laser ablation right greater saphenous vein and stab phlebectomy 10-20 incisions   . LEFT MENISCUS    . MINOR DEVIATED SEPTUM    . NASAL SINUS SURGERY    . TEAR DUCT PROBING Right    x 2  . TEAR DUCT PROBING Left   . TRACHEOSTOMY CLOSURE     over 30 surgeries  . UPPER GASTROINTESTINAL ENDOSCOPY      FAMILY HISTORY:   Family History  Problem Relation Age of Onset  . Asthma Daughter   . Irritable bowel syndrome Daughter   . Irritable bowel syndrome Sister   . Other Sister        Civil Service fast streamer   . CAD Father   . Skin cancer Father   . Heart disease Father   . Osteoporosis Father   . COPD Father   . Prostate cancer Father   . Breast cancer Sister   . Irritable bowel syndrome Sister   . Ulcerative colitis Sister   . Hypertension Mother   . Osteoporosis Mother   . Breast cancer Mother 61       IDC  . Asthma Sister   . Other Sister        PVC's  . Colon cancer Neg Hx   . Pancreatic cancer Neg Hx   . Stomach cancer Neg Hx   . Liver disease Neg Hx   .  Esophageal cancer Neg Hx   . Rectal cancer Neg Hx   The patient's father died in 12-Oct-2018.  The patient's mother is alive in her late 82s.  She was diagnosed with what appears to be early stage breast cancer and 2020 the patient has 3 sisters.  There is no one else in the family with autoimmune disease or with cancer.   GYNECOLOGIC HISTORY:   She is GX, P2, first pregnancy to term age 62.  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  She was furloughed from those jobs and is now seeking a different employment.  Her former husband,  Hilliard Clark, works for the [employer] for [employer], where he is the [employer].  He is living in [place] and has remarried.  They have completed their divorce agreement. The patient's son [name], age 97, is currently in [place] working as an Pension scheme manager and also working service jobs as is his girlfriend. Son [name], age 29, graduated from [place] [employer] studying exercise science, and is currently living with the patient and looking for a job.  He is in transition (female to female).  The patient attends [employer] here in [place].   HEALTH MAINTENANCE: Social History   Tobacco Use  . Smoking status: Never Smoker  . Smokeless tobacco: Never Used  Vaping Use  . Vaping Use: Never used  Substance Use Topics  . Alcohol use: Yes    Alcohol/week: 0.0 standard drinks    Comment: occasionally  . Drug use: No     Allergies  Allergen Reactions  . Codeine Itching  . Eletriptan Nausea Only  . Vancomycin Hives  . Tape Rash   Current Outpatient Medications  Medication Sig Dispense Refill  . albuterol (PROVENTIL HFA;VENTOLIN HFA) 108 (90 BASE) MCG/ACT inhaler Inhale 2 puffs into the lungs every 6 (six) hours as needed. For shortness of breath or wheezing    . denosumab (PROLIA) 60 MG/ML SOSY injection  Inject 60 mg into the skin every 6 (six) months.    . dexlansoprazole (DEXILANT) 60 MG capsule Take 1 capsule (60 mg total) by mouth 2 (two) times daily. 180 capsule 3  . fluticasone (FLONASE) 50 MCG/ACT nasal spray Place 2 sprays into the nose daily.    . Hypertonic Nasal Wash (SINUS RINSE BOTTLE KIT NA) Place into the nose. Adding Topamax and Budesomibe added to rinse    . Lactobacillus Rhamnosus, GG, (CULTURELLE) CAPS Take by mouth 1 day or 1 dose.    Marland Kitchen LORazepam (ATIVAN) 1 MG tablet Take 1 tablet (1 mg total) by mouth 2 (two) times daily as needed for anxiety. 60 tablet 1  . Multiple Vitamin (MULTIVITAMIN) tablet Take 1 tablet by mouth daily.    . Multiple Vitamins-Minerals (PRESERVISION AREDS 2 PO) Take 1 tablet by mouth daily.     . predniSONE (DELTASONE) 5 MG tablet Take 1 tablet (5 mg total) by mouth daily with breakfast.    . sulfamethoxazole-trimethoprim (BACTRIM) 400-80 MG tablet Take 1 tablet by mouth daily.    Marland Kitchen zolpidem (AMBIEN) 10 MG tablet Take one tablet hs prn.  Do not take with Ativan 30 tablet 1   No current facility-administered medications for this visit.    OBJECTIVE: white woman who appears stated age  60:   09/07/20 1100  BP: 115/78  Pulse: 63  Resp: 18  Temp: 98.1 F (36.7 C)  SpO2: 100%    BMI: Body mass index is 25.78 kg/m.   ECOG FS: 1  Sclerae unicteric, EOMs intact Wearing a mask No cervical or supraclavicular adenopathy Lungs no rales or rhonchi Heart regular rate and rhythm Abd soft, nontender, positive bowel sounds MSK no focal spinal tenderness, no upper extremity lymphedema Neuro: nonfocal, well oriented, appropriate affect Breasts: Deferred   LAB RESULTS:    Chemistry      Component Value Date/Time   NA 140 09/07/2020 1043   NA 140 08/06/2017 1335   K 4.4 09/07/2020 1043   K 4.7 08/06/2017 1335   CL 107 09/07/2020 1043   CL 106 11/07/2012 0916   CO2 25 09/07/2020 1043   CO2 26 08/06/2017 1335  BUN 20 09/07/2020 1043   BUN  17.3 08/06/2017 1335   CREATININE 0.97 09/07/2020 1043   CREATININE 0.97 10/01/2019 0823   CREATININE 0.9 08/06/2017 1335      Component Value Date/Time   CALCIUM 9.5 09/07/2020 1043   CALCIUM 9.9 08/06/2017 1335   ALKPHOS 41 09/07/2020 1043   ALKPHOS 50 08/06/2017 1335   AST 17 09/07/2020 1043   AST 14 (L) 10/01/2019 0823   AST 18 08/06/2017 1335   ALT 18 09/07/2020 1043   ALT 14 10/01/2019 0823   ALT 20 08/06/2017 1335   BILITOT 0.4 09/07/2020 1043   BILITOT 0.3 10/01/2019 0823   BILITOT 0.40 08/06/2017 1335      Lab Results  Component Value Date   WBC 9.2 09/07/2020   HGB 13.4 09/07/2020   HCT 41.8 09/07/2020   MCV 92.3 09/07/2020   PLT 314 09/07/2020   NEUTROABS 6.7 09/07/2020    STUDIES: No results found.   ASSESSMENT: 60 y.o.  [place] woman with a history of Wegener's granulomatosis initially diagnosed in 1999, variously treated as noted above, s/p Rituxan x4 completed July 2010   (1) osteoporosis, treated on alendronate between 2002 and 2005, ibandronate between 2005 in 2008, then risedronate between 2008 and 2011, first dose of Reclast/zolendronate 10/24/2013  (a) DEXA scan 12/22/2012 lowest reading -2.8 at the spine, -2.1 of the femoral neck  (b) repeat bone density 2017 show worsening T score-- zoledronate discontinued 01/18/2015  (c) denosumab/Prolia started 05/02/2016  (d) repeat bone density 11/11/2019 shows a T score of -2.5   PLAN: Lachlan appears to be doing well as far as her Wegener's is concerned and she is on minimal immunosuppression now.  Nevertheless she is immunosuppressed and I would be concerned if she had a job that dealt with face-to-face interactions on a frequent basis.  Working from home or working in a cubicle I think would be acceptable and she would need of course to continue to distance and wear a mask.  She has been actively pursuing options.  We are giving her Prolia today and in 6 months.  She will see me at that time.  She knows  to call for any other issue that may develop before then  Total encounter time 25 minutes.  Ifeanyi Mickelson, Virgie Dad, MD  09/07/20 11:31 AM Medical Oncology and Hematology [facility] [place], [place] [address] Tel. [phone]    Fax. [phone]    I, Wilburn Mylar, am acting as scribe for Dr. Virgie Dad. Elmar Antigua.  I, Lurline Del MD, have reviewed the above documentation for accuracy and completeness, and I agree with the above.   *Total Encounter Time as defined by the Centers for Medicare and Medicaid Services includes, in addition to the face-to-face time of a patient visit (documented in the note above) non-face-to-face time: obtaining and reviewing outside history, ordering and reviewing medications, tests or procedures, care coordination (communications with other health care professionals or caregivers) and documentation in the medical record.

## 2020-09-08 ENCOUNTER — Telehealth: Payer: Self-pay | Admitting: Oncology

## 2020-09-08 NOTE — Telephone Encounter (Signed)
Scheduled appts per 1/4 los. Left voicemail with new appt date/time.

## 2020-09-17 ENCOUNTER — Telehealth: Payer: Self-pay | Admitting: Internal Medicine

## 2020-09-17 DIAGNOSIS — E042 Nontoxic multinodular goiter: Secondary | ICD-10-CM | POA: Diagnosis not present

## 2020-09-17 DIAGNOSIS — M3130 Wegener's granulomatosis without renal involvement: Secondary | ICD-10-CM | POA: Diagnosis not present

## 2020-09-17 DIAGNOSIS — G4700 Insomnia, unspecified: Secondary | ICD-10-CM | POA: Diagnosis not present

## 2020-09-17 DIAGNOSIS — K219 Gastro-esophageal reflux disease without esophagitis: Secondary | ICD-10-CM | POA: Diagnosis not present

## 2020-09-21 ENCOUNTER — Encounter: Payer: Self-pay | Admitting: Oncology

## 2020-09-22 ENCOUNTER — Telehealth: Payer: Self-pay | Admitting: Internal Medicine

## 2020-09-22 NOTE — Telephone Encounter (Signed)
Will call [facility] tomorrow, currently after 5:00 pm 5:03 PM

## 2020-09-22 NOTE — Telephone Encounter (Signed)
Sent order to [facility]

## 2020-09-23 DIAGNOSIS — J42 Unspecified chronic bronchitis: Secondary | ICD-10-CM | POA: Diagnosis not present

## 2020-09-23 DIAGNOSIS — H04123 Dry eye syndrome of bilateral lacrimal glands: Secondary | ICD-10-CM | POA: Diagnosis not present

## 2020-09-23 DIAGNOSIS — H353131 Nonexudative age-related macular degeneration, bilateral, early dry stage: Secondary | ICD-10-CM | POA: Diagnosis not present

## 2020-09-23 DIAGNOSIS — H2513 Age-related nuclear cataract, bilateral: Secondary | ICD-10-CM | POA: Diagnosis not present

## 2020-09-24 NOTE — Telephone Encounter (Signed)
Called [facility] and spoke with Estill Bamberg  She states that the pt is already set up with neb 09/23/20 and nothing further needed

## 2020-10-04 ENCOUNTER — Ambulatory Visit: Payer: Medicare Other | Admitting: Internal Medicine

## 2020-10-06 DIAGNOSIS — M818 Other osteoporosis without current pathological fracture: Secondary | ICD-10-CM | POA: Diagnosis not present

## 2020-10-06 DIAGNOSIS — J3489 Other specified disorders of nose and nasal sinuses: Secondary | ICD-10-CM | POA: Diagnosis not present

## 2020-10-06 DIAGNOSIS — R040 Epistaxis: Secondary | ICD-10-CM | POA: Diagnosis not present

## 2020-10-06 DIAGNOSIS — M3130 Wegener's granulomatosis without renal involvement: Secondary | ICD-10-CM | POA: Diagnosis not present

## 2020-10-06 DIAGNOSIS — H9319 Tinnitus, unspecified ear: Secondary | ICD-10-CM | POA: Diagnosis not present

## 2020-10-06 DIAGNOSIS — H04559 Acquired stenosis of unspecified nasolacrimal duct: Secondary | ICD-10-CM | POA: Diagnosis not present

## 2020-10-06 DIAGNOSIS — Z79899 Other long term (current) drug therapy: Secondary | ICD-10-CM | POA: Diagnosis not present

## 2020-10-06 DIAGNOSIS — Z7952 Long term (current) use of systemic steroids: Secondary | ICD-10-CM | POA: Diagnosis not present

## 2020-10-06 DIAGNOSIS — J386 Stenosis of larynx: Secondary | ICD-10-CM | POA: Diagnosis not present

## 2020-10-06 DIAGNOSIS — M310 Hypersensitivity angiitis: Secondary | ICD-10-CM | POA: Diagnosis not present

## 2020-10-06 DIAGNOSIS — J329 Chronic sinusitis, unspecified: Secondary | ICD-10-CM | POA: Diagnosis not present

## 2020-10-24 DIAGNOSIS — J42 Unspecified chronic bronchitis: Secondary | ICD-10-CM | POA: Diagnosis not present

## 2020-11-03 DIAGNOSIS — M79672 Pain in left foot: Secondary | ICD-10-CM | POA: Diagnosis not present

## 2020-11-21 DIAGNOSIS — J42 Unspecified chronic bronchitis: Secondary | ICD-10-CM | POA: Diagnosis not present

## 2020-12-01 DIAGNOSIS — M79672 Pain in left foot: Secondary | ICD-10-CM | POA: Diagnosis not present

## 2020-12-22 DIAGNOSIS — J42 Unspecified chronic bronchitis: Secondary | ICD-10-CM | POA: Diagnosis not present

## 2020-12-24 DIAGNOSIS — E042 Nontoxic multinodular goiter: Secondary | ICD-10-CM | POA: Diagnosis not present

## 2020-12-24 DIAGNOSIS — G4700 Insomnia, unspecified: Secondary | ICD-10-CM | POA: Diagnosis not present

## 2020-12-24 DIAGNOSIS — K219 Gastro-esophageal reflux disease without esophagitis: Secondary | ICD-10-CM | POA: Diagnosis not present

## 2020-12-24 DIAGNOSIS — M3130 Wegener's granulomatosis without renal involvement: Secondary | ICD-10-CM | POA: Diagnosis not present

## 2020-12-25 DIAGNOSIS — M79672 Pain in left foot: Secondary | ICD-10-CM | POA: Diagnosis not present

## 2020-12-29 DIAGNOSIS — M79672 Pain in left foot: Secondary | ICD-10-CM | POA: Diagnosis not present

## 2021-01-21 DIAGNOSIS — J42 Unspecified chronic bronchitis: Secondary | ICD-10-CM | POA: Diagnosis not present

## 2021-02-01 ENCOUNTER — Other Ambulatory Visit: Payer: Self-pay | Admitting: Oncology

## 2021-02-01 DIAGNOSIS — Z1231 Encounter for screening mammogram for malignant neoplasm of breast: Secondary | ICD-10-CM

## 2021-02-07 ENCOUNTER — Encounter: Payer: Self-pay | Admitting: Oncology

## 2021-02-07 ENCOUNTER — Ambulatory Visit: Payer: Medicare Other

## 2021-02-07 ENCOUNTER — Ambulatory Visit: Payer: Medicare Other | Admitting: Oncology

## 2021-02-07 ENCOUNTER — Other Ambulatory Visit: Payer: Medicare Other

## 2021-02-07 DIAGNOSIS — M3130 Wegener's granulomatosis without renal involvement: Secondary | ICD-10-CM | POA: Diagnosis not present

## 2021-02-07 DIAGNOSIS — R490 Dysphonia: Secondary | ICD-10-CM | POA: Diagnosis not present

## 2021-02-07 DIAGNOSIS — J386 Stenosis of larynx: Secondary | ICD-10-CM | POA: Diagnosis not present

## 2021-02-08 ENCOUNTER — Other Ambulatory Visit: Payer: Self-pay

## 2021-02-08 ENCOUNTER — Inpatient Hospital Stay: Payer: Medicare Other | Attending: Oncology

## 2021-02-08 ENCOUNTER — Other Ambulatory Visit: Payer: Self-pay | Admitting: *Deleted

## 2021-02-08 ENCOUNTER — Other Ambulatory Visit: Payer: Medicare Other

## 2021-02-08 DIAGNOSIS — Z79899 Other long term (current) drug therapy: Secondary | ICD-10-CM | POA: Diagnosis not present

## 2021-02-08 DIAGNOSIS — M816 Localized osteoporosis [Lequesne]: Secondary | ICD-10-CM

## 2021-02-08 DIAGNOSIS — E079 Disorder of thyroid, unspecified: Secondary | ICD-10-CM

## 2021-02-08 DIAGNOSIS — M3130 Wegener's granulomatosis without renal involvement: Secondary | ICD-10-CM

## 2021-02-08 DIAGNOSIS — H1045 Other chronic allergic conjunctivitis: Secondary | ICD-10-CM | POA: Diagnosis not present

## 2021-02-08 DIAGNOSIS — H04123 Dry eye syndrome of bilateral lacrimal glands: Secondary | ICD-10-CM | POA: Diagnosis not present

## 2021-02-08 DIAGNOSIS — G514 Facial myokymia: Secondary | ICD-10-CM | POA: Diagnosis not present

## 2021-02-08 LAB — URINALYSIS, COMPLETE (UACMP) WITH MICROSCOPIC
Bacteria, UA: NONE SEEN
Bilirubin Urine: NEGATIVE
Glucose, UA: NEGATIVE mg/dL
Ketones, ur: NEGATIVE mg/dL
Leukocytes,Ua: NEGATIVE
Nitrite: NEGATIVE
Protein, ur: NEGATIVE mg/dL
Specific Gravity, Urine: 1.008 (ref 1.005–1.030)
pH: 7 (ref 5.0–8.0)

## 2021-02-08 LAB — CMP (CANCER CENTER ONLY)
ALT: 16 U/L (ref 0–44)
AST: 17 U/L (ref 15–41)
Albumin: 3.8 g/dL (ref 3.5–5.0)
Alkaline Phosphatase: 37 U/L — ABNORMAL LOW (ref 38–126)
Anion gap: 7 (ref 5–15)
BUN: 18 mg/dL (ref 6–20)
CO2: 27 mmol/L (ref 22–32)
Calcium: 9.5 mg/dL (ref 8.9–10.3)
Chloride: 107 mmol/L (ref 98–111)
Creatinine: 0.86 mg/dL (ref 0.44–1.00)
GFR, Estimated: >60 mL/min (ref >60)
Glucose, Bld: 84 mg/dL (ref 70–99)
Potassium: 4 mmol/L (ref 3.5–5.1)
Sodium: 141 mmol/L (ref 135–145)
Total Bilirubin: 0.5 mg/dL (ref 0.3–1.2)
Total Protein: 7 g/dL (ref 6.5–8.1)

## 2021-02-08 LAB — CBC WITH DIFFERENTIAL (CANCER CENTER ONLY)
Abs Immature Granulocytes: 0.04 10*3/uL (ref 0.00–0.07)
Basophils Absolute: 0.1 10*3/uL (ref 0.0–0.1)
Basophils Relative: 1 %
Eosinophils Absolute: 0.1 10*3/uL (ref 0.0–0.5)
Eosinophils Relative: 1 %
HCT: 41 % (ref 36.0–46.0)
Hemoglobin: 13.6 g/dL (ref 12.0–15.0)
Immature Granulocytes: 1 %
Lymphocytes Relative: 22 %
Lymphs Abs: 1.6 10*3/uL (ref 0.7–4.0)
MCH: 30.5 pg (ref 26.0–34.0)
MCHC: 33.2 g/dL (ref 30.0–36.0)
MCV: 91.9 fL (ref 80.0–100.0)
Monocytes Absolute: 0.6 10*3/uL (ref 0.1–1.0)
Monocytes Relative: 8 %
Neutro Abs: 5 10*3/uL (ref 1.7–7.7)
Neutrophils Relative %: 67 %
Platelet Count: 327 10*3/uL (ref 150–400)
RBC: 4.46 MIL/uL (ref 3.87–5.11)
RDW: 13.2 % (ref 11.5–15.5)
WBC Count: 7.4 10*3/uL (ref 4.0–10.5)
nRBC: 0 % (ref 0.0–0.2)

## 2021-02-08 LAB — URIC ACID: Uric Acid, Serum: 3.4 mg/dL (ref 2.5–7.1)

## 2021-02-21 DIAGNOSIS — J42 Unspecified chronic bronchitis: Secondary | ICD-10-CM | POA: Diagnosis not present

## 2021-02-26 IMAGING — US US THYROID
1 series · 12 of 25 positions shown · non-contrast
Comparison: 02/19/2018;

CLINICAL DATA: Prior ultrasound follow-up. History of thyroid
nodule fine-needle aspiration of two left-sided thyroid nodules
performed 11/21/2016.

EXAM:
THYROID ULTRASOUND
TECHNIQUE: Ultrasound examination of the thyroid gland and adjacent soft
tissues was performed.

[Series 1: us thyroid · 0.04mm/px · 12 of 67 slices shown]
[im 3/67]
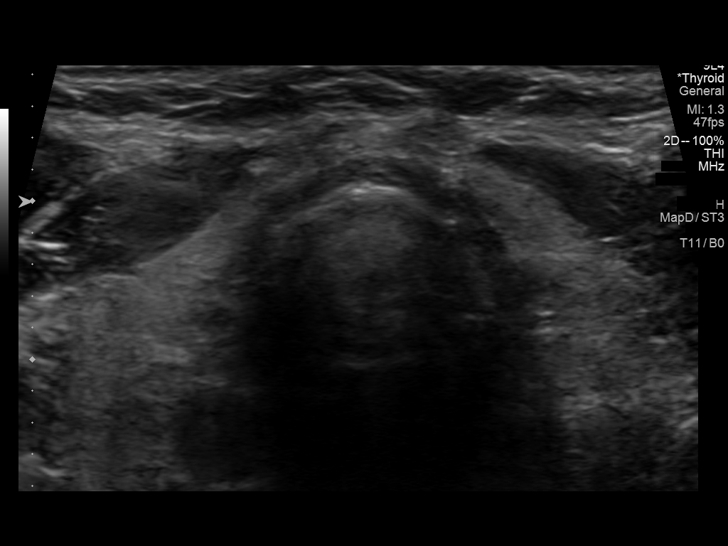
[im 9/67]
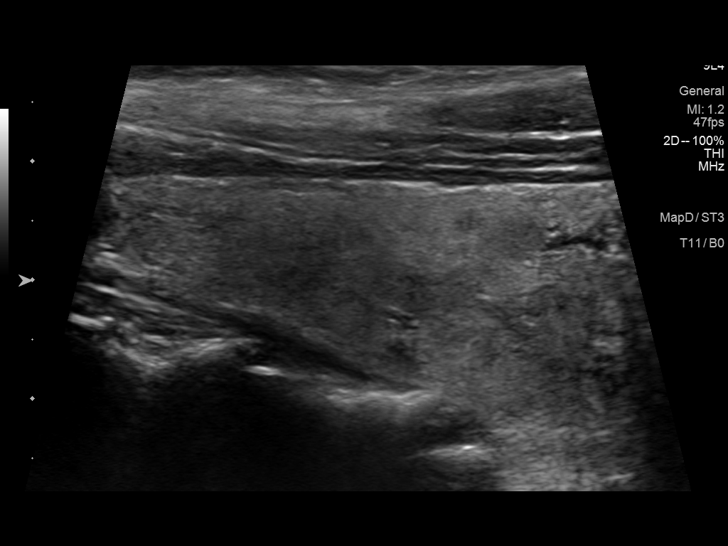
[im 14/67]
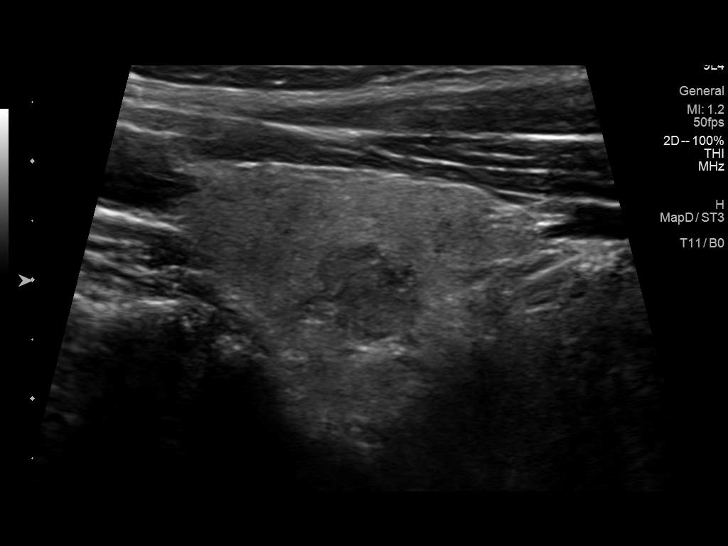
[im 20/67]
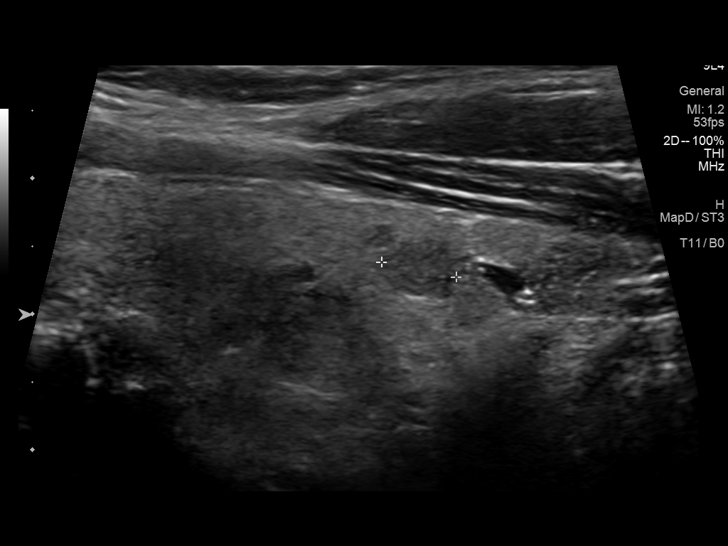
[im 25/67]
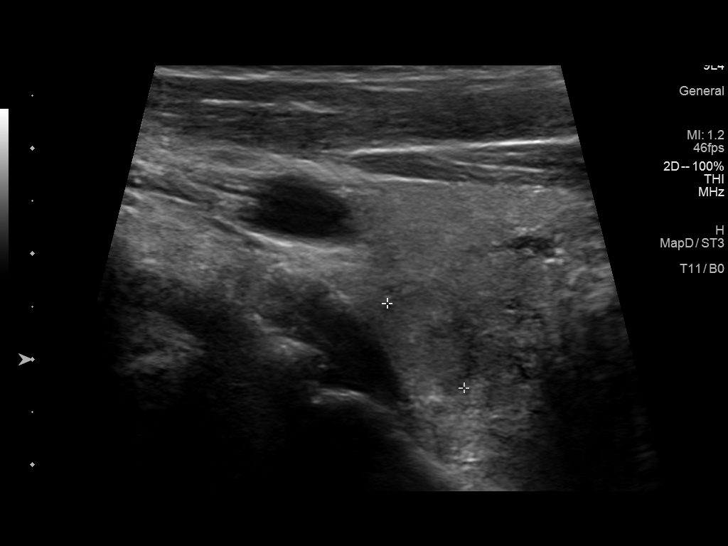
[im 31/67]
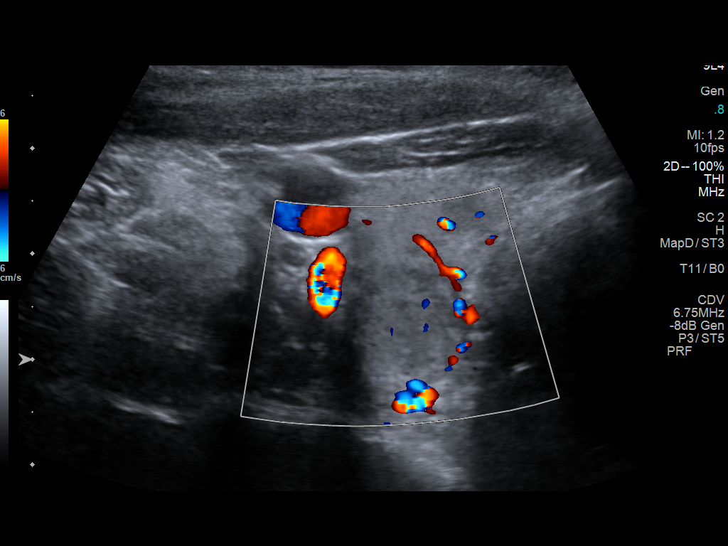
[im 36/67]
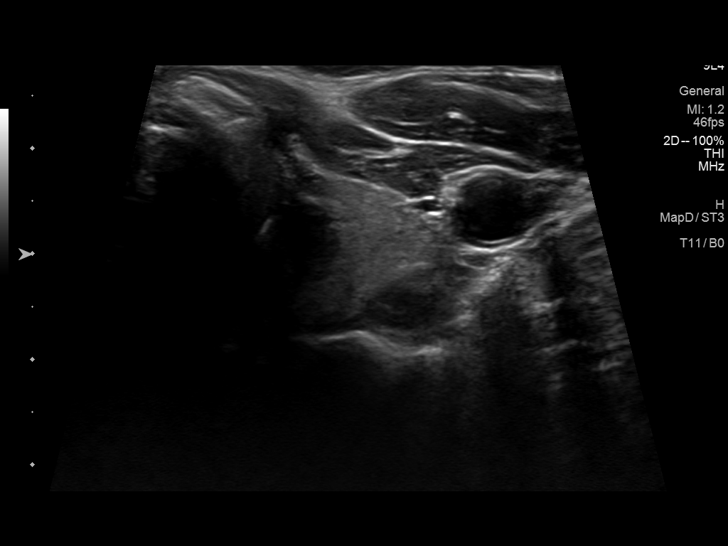
[im 42/67]
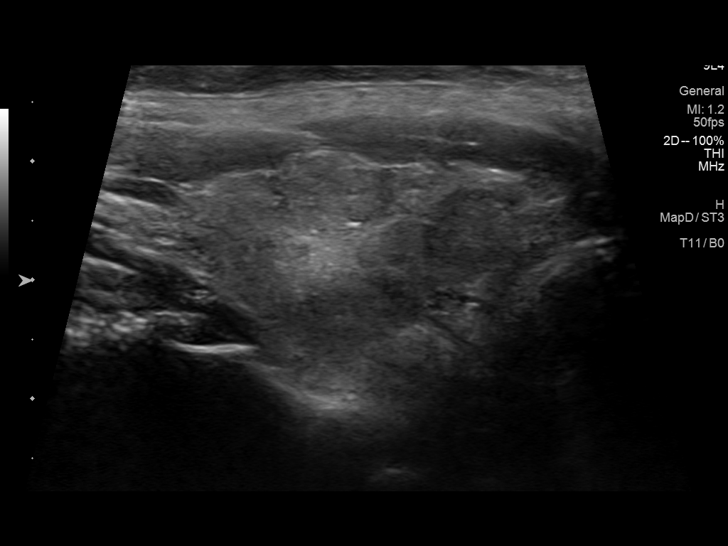
[im 47/67]
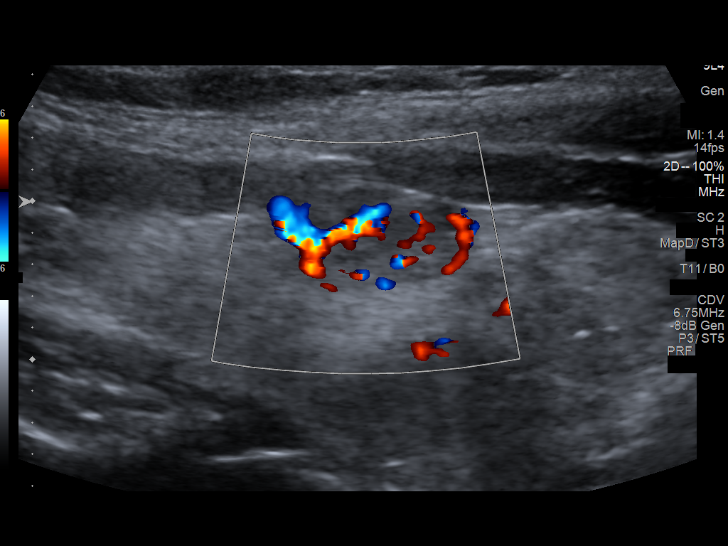
[im 53/67]
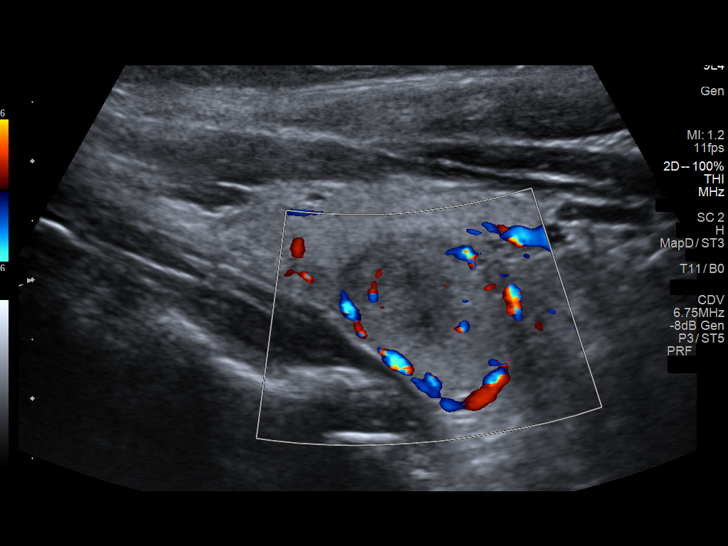
[im 58/67]
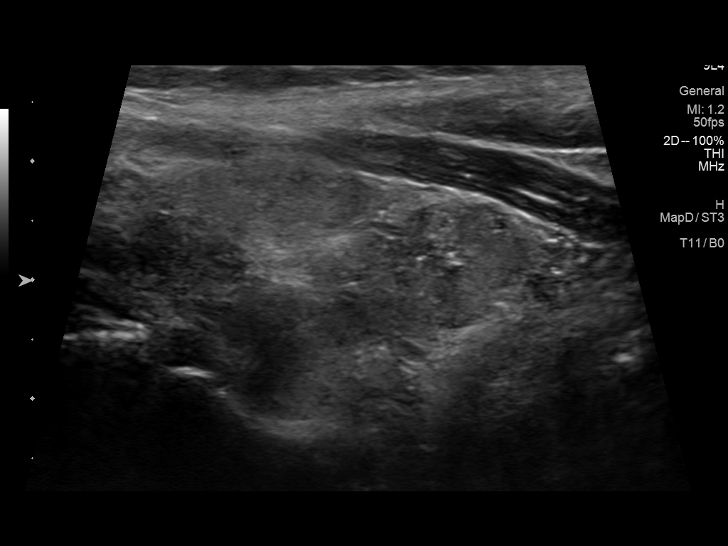
[im 64/67]
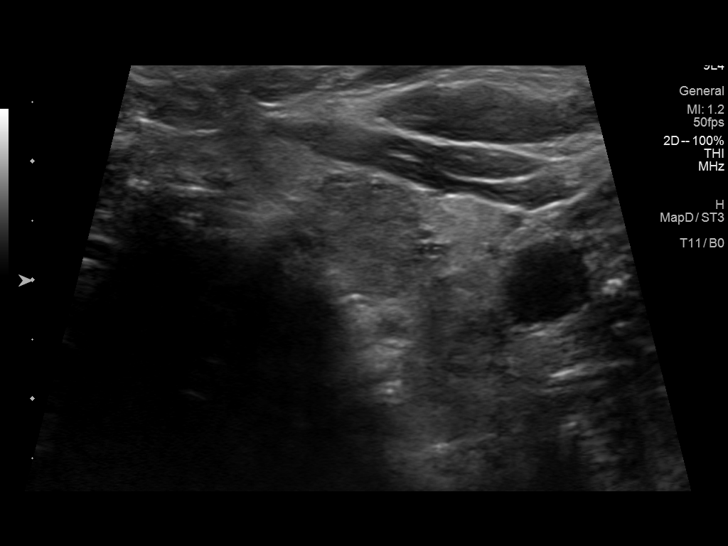

[12 of 25 positions shown; findings below may reference images not displayed]

10/18/2016; 04/03/2017; left-sided thyroid
nodule fine-needle aspiration-11/21/2016
FINDINGS: Parenchymal Echotexture: Mildly heterogenous

Isthmus: Normal in size measures 0.2 cm in diameter, unchanged

Right lobe: Normal in size measuring 4.6 x 1.9 x 1.9 cm, unchanged,
previously, 4.8 x 1.3 x 1.7 cm

Left lobe: Normal in size measuring 4.2 x 2.1 x 1.8 cm, unchanged,
previously, 4.6 x 1.6 x 1.8 cm

_________________________________________________________

Estimated total number of nodules >/= 1 cm: 4

Number of spongiform nodules >/=  2 cm not described below (TR1): 0

Number of mixed cystic and solid nodules >/= 1.5 cm not described
below (TR2): 0

_________________________________________________________

There is an approximately 0.8 x 0.7 x 0.6 cm hypoechoic nodule
within the inferior pole the right lobe of the thyroid (labeled 1),
is unchanged compared to the [DATE] examination, previously, 0.8 x
0.7 x 0.6 cm, and again does not meet imaging criteria to recommend
percutaneous sampling or continued dedicated follow-up.

There is an approximately 0.6 x 0.5 x 0.5 cm hypoechoic nodule
within the inferior pole the right lobe of the thyroid (labeled 2),
which is unchanged compared to the [DATE] examination, previously,
0.6 x 0.4 x 0.4 cm, and again does not meet criteria to recommend
percutaneous sampling or continued dedicated follow-up.

Previously questioned approximately 0.9 cm hypoechoic nodule within
the inferior pole the right lobe of the thyroid noted to contain
punctate echogenic foci is not definitely identified on the present
examination and thus favored to have represented a pseudonodule.

There is a questioned approximately 1.1 cm ill-defined isoechoic
nodule within the inferior posterior aspect the right lobe of the
thyroid (labeled 3) which is favored to represent a pseudonodule as
it lacks defined borders on both provided transverse and sagittal
images.

_________________________________________________________

Nodule # 4:

Prior biopsy: No

Location: Left; Superior

Maximum size: 1.2 cm; Other 2 dimensions: 0.9 x 0.6 cm, previously,
1.2 0.8 x 0.5 cm when compared to the [DATE] examination)

Composition: solid/almost completely solid (2)

Echogenicity: hypoechoic (2)

Shape: not taller-than-wide (0)

Margins: ill-defined (0)

Echogenic foci: large comet-tail artifacts (0)

ACR TI-RADS total points: 4.

ACR TI-RADS risk category:  TR4 (4-6 points).

Significant change in size (>/= 20% in two dimensions and minimal
increase of 2 mm): No

Change in features: No

Change in ACR TI-RADS risk category: No

ACR TI-RADS recommendations:

*Given size (>/= 1 - 1.4 cm) and appearance, a follow-up ultrasound
in 1 year should be considered based on TI-RADS criteria.

_________________________________________________________

The previously biopsied approximately 1.6 x 1.2 x 1.1 cm hypoechoic
nodule within the mid aspect of the left lobe of the thyroid labeled
5) as well as the 1.2 x 1.0 x 1.0 cm nodule within the inferior pole
the left lobe of the thyroid (labeled 6), are both unchanged
compared to the [DATE] examination, previously, 1.7 x 1.3 x 1.1 cm
and 1.3 x 0.8 x 0.8 cm when compared to the [DATE] examination.
Correlation with biopsy results is advised.
IMPRESSION: 1. Similar findings of multinodular goiter. No definitive worrisome
new or enlarging thyroid nodules.
2. Nodule #4 is unchanged compared to the [DATE] examination though
again meets imaging criteria to recommend a 1 year follow-up.
and thus a benign etiology.
3. Previously identified nodule within the inferior aspect of the
right lobe of the thyroid (previously labeled 3), is not seen on the
present examination thus favored to have represented a pseudonodule.
4. Previously biopsied nodules within the left lobe of the thyroid
are grossly unchanged compared to the [DATE] examination.
Correlation with prior biopsy results is advised. Assuming a benign
pathologic diagnosis, repeat sampling and/or continued dedicated
follow-up is not recommended.

The above is in keeping with the ACR TI-RADS recommendations - [HOSPITAL] 2416;[DATE].

## 2021-03-08 ENCOUNTER — Telehealth: Payer: Self-pay | Admitting: Internal Medicine

## 2021-03-08 NOTE — Telephone Encounter (Signed)
Patient calling to inform she is experiencing acid reflux sxs.. Pt states she is currently taking meds Dexilant/Pepcid AC; however she is still not feeling better. Pt wants to know how to treat sxs.. Plz advise thank you

## 2021-03-08 NOTE — Telephone Encounter (Signed)
Pt states she has had some stressors recently, had to move suddenly and lost her job and had to get a new one and her reflux is bad. States she has been taking Dexilant in the am and pepcid 20 at night. She is still having acid come up in the back of her throat. Pt also concerned as she has been on dexilant for a while, concerned about her bone health. Please advise.

## 2021-03-08 NOTE — Telephone Encounter (Signed)
Provide reassurance regarding PPI use It would be reasonable to see endocrine regarding osteoporosis (she had a bone density last year) While symptoms are flaring I would recommend she use Dexilant 60 mg every morning, Pepcid 20 mg mid day and 40 mg at bedtime After symptoms under control for 1+ week then she can return to previous regimen GERD diet

## 2021-03-08 NOTE — Telephone Encounter (Signed)
Spoke with pt and she is aware and will follow Dr. Vena Rua reommendations.

## 2021-03-09 NOTE — Progress Notes (Addendum)
ID: Theresa [name]  DOB: 01-Nov-1960  MR#: [id number]  CSN#: [id number]  Patient Care Team: Antony Contras, MD as PCP - General (Family Medicine) Brand Males, MD as Consulting Physician (Pulmonary Disease) Delrae Rend, MD as Consulting Physician (Endocrinology) Briannah Lona, Virgie Dad, MD as Consulting Physician (Oncology) Ernestine Conrad, MD as Referring Physician (Otolaryngology) Clinger, Chrystie Nose, MD (Otolaryngology) Pedro Earls, MD as Attending Physician (Family Medicine) Don Broach, MD as Referring Physician (Internal Medicine) Pyrtle, Lajuan Lines, MD as Consulting Physician (Gastroenterology) Lanice Schwab, DO as Referring Physician (Rheumatology) OTHER MD: [name] (fax [phone]); Jaquita Rector; Danton Sewer; Kandis Ban; Huey Romans  CHIEF COMPLAINT: Wegener's granulomatosis  CURRENT TREATMENT: per doctors Specks and Ramaswamy   INTERVAL HISTORY:   Theresa [name] returns today for 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   She has not been able to get to [facility] to see Dr. Mariane Baumgarten, but does see Dr. Chase Caller locally for her granulomatosis.  Meanwhile she continues on Prolia with good tolerance.  Her most recent dose was 09/07/2020  She has a dose due today.  Most recent bone density 11/11/2019 shows a T score of -2.5, which is stable to improved  Since her last visit, she has not undergone any additional studies.   REVIEW OF SYSTEMS:  Theresa [name] is going through a difficult..  Her lease on her townhouse locally ran out and she found it very difficult to get affordable rental space in [place].  She is now living in [place], near [place].  Her son, who is completing his transition and recently had surgery, is currently staying with her.  She has a job now which unfortunately has no benefits.  She continues to receive disability but that may terminate if she continues on that job.  She is no longer receiving alimony.  She is understandably stressed and that  is discussed further below.  She is unfortunately not able to exercise as much as she would like because she had some toe issues.  A detailed review of systems was otherwise stable   COVID 19 VACCINATION STATUS: fully vaccinated [employer]), with booster on 05/01/2020   HISTORY OF WEGENER'S GRANULOMATOSIS:  From the original intake note:  Theresa [name] tells me her diagnosis of Wegener was initially made at the [facility] by Dr. [name] in 1999.  She has been treated in the past with Cytoxan and prednisone, methotrexate very briefly, Cytoxan orally, then Imuran for some time, and then off treatment for some years.  She was started on prednisone alone in 2009, and then in 06/2008 the azathioprine was added again at 150 mg daily, and variable doses of prednisone.  It is felt that she failed remission maintenance therapy since she could not reduce her dose of prednisone below 20 mg daily without having a resurgence of symptoms, and therefore she has been started on Rituxan with the first dose given at the [facility] on 06/24.  The patient tolerated the treatment quite well.    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/07/2014   Granulomatosis with polyangiitis ([place]) 03/24/2009   Qualifier: Diagnosis of  By: Doy Mince LPN, Megan     Headaches, cluster    Heart murmur    Hx MRSA infection    Insomnia  off BZP now  07/06/2014   Migraine headache 07/06/2014   Mitral valve prolapse    Non-seasonal allergic rhinitis    Osteoporosis    Sleep disorder 10/20/2014   Thyroid disease     Thyroid nodules   Tinnitus    Varicose veins    Varicose veins of lower extremities with complications 0/05/6044   Wegener's granulomatosis 1998   autoimmune vasculitits   Wegener's granulomatosis (granulomatosis with polyangiitis) 01/24/2015  history of migraines   SURGICAL HISTORY: Past Surgical History:  Procedure Laterality Date   AIRWAY FOREIGN BODY REMOVAL     with tracheostomy   COLONOSCOPY     ENDOVENOUS ABLATION SAPHENOUS VEIN W/ LASER Left 06-03-2015   endovenous laser ablation left greater saphenous vein and stab phlebectomy 10-20 incisions left leg by Curt Jews MD   ENDOVENOUS ABLATION SAPHENOUS VEIN W/ LASER Right 06-24-2015   endovenous laser ablation right greater saphenous vein and stab phlebectomy 10-20 incisions    LEFT MENISCUS     MINOR DEVIATED SEPTUM     NASAL SINUS SURGERY     TEAR DUCT PROBING Right    x 2   TEAR DUCT PROBING Left    TRACHEOSTOMY CLOSURE     over 30 surgeries   UPPER GASTROINTESTINAL ENDOSCOPY      FAMILY HISTORY:   Family History  Problem Relation Age of Onset   Asthma Daughter    Irritable bowel syndrome Daughter    Irritable bowel syndrome Sister    Other Sister        Chiropractor    CAD Father    Skin cancer Father    Heart disease Father    Osteoporosis Father    COPD Father    Prostate cancer Father    Breast cancer Sister    Irritable bowel syndrome Sister    Ulcerative colitis Sister    Hypertension Mother    Osteoporosis Mother    Breast cancer Mother 31       IDC   Asthma Sister    Other Sister        PVC's   Colon cancer Neg Hx    Pancreatic cancer Neg Hx    Stomach cancer Neg Hx    Liver disease Neg Hx    Esophageal cancer Neg Hx    Rectal cancer Neg Hx   The patient's father died in 2018-10-09.  The patient's mother is alive in her late 16s.  She was diagnosed with what appears to be early stage breast cancer and 2020 the patient has 3 sisters.  There is no one else in the family with  autoimmune disease or with cancer.   GYNECOLOGIC HISTORY:   She is GX, P2, first pregnancy to term age 32.  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  She was furloughed from those jobs and is now seeking a different employment.  Her former husband, Hilliard Clark, works for the [employer] for [employer], where he is the [employer].  He is living in [place] and has remarried.  They have completed their divorce  agreement. The patient's son [name], age 107, is currently in [place] working as an Pension scheme manager and also working service jobs as is his girlfriend. Son [name], age 58, graduated from [place] state studying exercise science, and is currently living with the patient and looking for a job.  He is in transition (female to female).  The patient attends [employer] here in [place].   HEALTH MAINTENANCE: Social History   Tobacco Use   Smoking status: Never   Smokeless tobacco: Never  Vaping Use   Vaping Use: Never used  Substance Use Topics   Alcohol use: Yes    Alcohol/week: 0.0 standard drinks    Comment: occasionally   Drug use: No     Allergies  Allergen Reactions   Codeine Itching   Eletriptan Nausea Only   Vancomycin Hives   Tape Rash   Current Outpatient Medications  Medication Sig Dispense Refill   albuterol (PROVENTIL HFA;VENTOLIN HFA) 108 (90 BASE) MCG/ACT inhaler Inhale 2 puffs into the lungs every 6 (six) hours as needed. For shortness of breath or wheezing     denosumab (PROLIA) 60 MG/ML SOSY injection Inject 60 mg into the skin every 6 (six) months.     dexlansoprazole (DEXILANT) 60 MG capsule Take 1 capsule (60 mg total) by mouth 2 (two) times daily. 180 capsule 3   fluticasone (FLONASE) 50 MCG/ACT nasal spray Place 2 sprays into the nose daily.     Hypertonic Nasal Wash (SINUS RINSE BOTTLE KIT NA) Place  into the nose. Adding Topamax and Budesomibe added to rinse     Lactobacillus Rhamnosus, GG, (CULTURELLE) CAPS Take by mouth 1 day or 1 dose.     LORazepam (ATIVAN) 1 MG tablet Take 1 tablet (1 mg total) by mouth 2 (two) times daily as needed for anxiety. 60 tablet 1   Multiple Vitamin (MULTIVITAMIN) tablet Take 1 tablet by mouth daily.     Multiple Vitamins-Minerals (PRESERVISION AREDS 2 PO) Take 1 tablet by mouth daily.      predniSONE (DELTASONE) 5 MG tablet Take 1 tablet (5 mg total) by mouth daily with breakfast.     sulfamethoxazole-trimethoprim (BACTRIM) 400-80 MG tablet Take 1 tablet by mouth daily.     zolpidem (AMBIEN) 10 MG tablet Take one tablet hs prn.  Do not take with Ativan 30 tablet 1   No current facility-administered medications for this visit.    OBJECTIVE: white woman who appears stated age  There were no vitals filed for this visit.   BMI: There is no height or weight on file to calculate BMI.   ECOG FS: 1  Sclerae unicteric, EOMs intact Wearing a mask No cervical or supraclavicular adenopathy Lungs no rales or rhonchi Heart regular rate and rhythm Abd soft, nontender, positive bowel sounds MSK no focal spinal tenderness, no upper extremity lymphedema Neuro: nonfocal, well oriented, appropriate affect Breast exam: Deferred   LAB RESULTS:    Chemistry      Component Value Date/Time   NA 141 02/08/2021 1006   NA 140 08/06/2017 1335   K 4.0 02/08/2021 1006   K 4.7 08/06/2017 1335   CL 107 02/08/2021 1006   CL 106 11/07/2012 0916   CO2 27 02/08/2021 1006   CO2 26 08/06/2017 1335   BUN 18 02/08/2021 1006   BUN 17.3 08/06/2017 1335   CREATININE 0.86 02/08/2021 1006   CREATININE 0.9 08/06/2017 1335      Component Value Date/Time   CALCIUM 9.5 02/08/2021 1006   CALCIUM 9.9 08/06/2017 1335  ALKPHOS 37 (L) 02/08/2021 1006   ALKPHOS 50 08/06/2017 1335   AST 17 02/08/2021 1006   AST 18 08/06/2017 1335   ALT 16 02/08/2021 1006   ALT 20 08/06/2017 1335    BILITOT 0.5 02/08/2021 1006   BILITOT 0.40 08/06/2017 1335      Lab Results  Component Value Date   WBC 7.4 02/08/2021   HGB 13.6 02/08/2021   HCT 41.0 02/08/2021   MCV 91.9 02/08/2021   PLT 327 02/08/2021   NEUTROABS 5.0 02/08/2021    STUDIES: No results found.   ASSESSMENT: 60 y.o.  [place] woman with a history of Wegener's granulomatosis initially diagnosed in 1999, variously treated as noted above, s/p Rituxan x4 completed July 2010   (1) osteoporosis, treated on alendronate between 2002 and 2005, ibandronate between 2005 in 2008, then risedronate between 2008 and 2011, first dose of Reclast/zolendronate 10/24/2013  (a) DEXA scan 12/22/2012 lowest reading -2.8 at the spine, -2.1 of the femoral neck  (b) repeat bone density 2017 show worsening T score-- zoledronate discontinued 01/18/2015  (c) denosumab/Prolia started 05/02/2016  (d) repeat bone density 11/11/2019 shows a T score of -2.5   PLAN: Kamrynn is stable as far as her Wegener's is concerned and also as far as her osteoporosis is concerned.  She will receive Prolia today.  Her next dose will be due in 51months.  She is now living in [place].  She has a foot in the door at a local [facility] clinic on [address] where she sees ENTs.  I think she will be able to find a rheumatologist there as well.  What she needs though is a pulmonologist and we will try to schedule her with one there.  Assuming she does that she will be able to transition all her medical care to the [place] area.  Incidentally she is due for mammography and we will try to schedule that locally as well for her (the [place] seems to be the nearest 1).  In case she moves back to [place] or these transitions of care do not occur she will be scheduled here for repeat Prolia dose in 6 months.  I think she would benefit from venlafaxine.  I wrote her for the 37.5 mg daily dose.  She will let us know in a couple of weeks whether that is  adequate or not  Total encounter time 25 minutes.   Tery Hoeger, Virgie Dad, MD  03/09/21 12:04 PM Medical Oncology and Hematology [facility], [place] [address] Tel. [phone]    Fax. [phone]    I, Wilburn Mylar, am acting as scribe for Dr. Virgie Dad. Theresa [name].  I, Lurline Del MD, have reviewed the above documentation for accuracy and completeness, and I agree with the above.   *Total Encounter Time as defined by the Centers for Medicare and Medicaid Services includes, in addition to the face-to-face time of a patient visit (documented in the note above) non-face-to-face time: obtaining and reviewing outside history, ordering and reviewing medications, tests or procedures, care coordination (communications with other health care professionals or caregivers) and documentation in the medical record.

## 2021-03-10 ENCOUNTER — Inpatient Hospital Stay: Payer: Medicare Other

## 2021-03-10 ENCOUNTER — Inpatient Hospital Stay: Payer: Medicare Other | Admitting: Oncology

## 2021-03-10 ENCOUNTER — Other Ambulatory Visit: Payer: Self-pay

## 2021-03-10 ENCOUNTER — Inpatient Hospital Stay: Payer: Medicare Other | Attending: Oncology

## 2021-03-10 VITALS — BP 116/71 | HR 69 | Temp 98.1°F | Resp 16 | Ht 66.0 in | Wt 163.8 lb

## 2021-03-10 DIAGNOSIS — M818 Other osteoporosis without current pathological fracture: Secondary | ICD-10-CM | POA: Insufficient documentation

## 2021-03-10 DIAGNOSIS — M3130 Wegener's granulomatosis without renal involvement: Secondary | ICD-10-CM | POA: Diagnosis not present

## 2021-03-10 DIAGNOSIS — Z79899 Other long term (current) drug therapy: Secondary | ICD-10-CM | POA: Diagnosis not present

## 2021-03-10 DIAGNOSIS — M816 Localized osteoporosis [Lequesne]: Secondary | ICD-10-CM | POA: Diagnosis not present

## 2021-03-10 DIAGNOSIS — M810 Age-related osteoporosis without current pathological fracture: Secondary | ICD-10-CM

## 2021-03-10 LAB — COMPREHENSIVE METABOLIC PANEL
ALT: 14 U/L (ref 0–44)
AST: 15 U/L (ref 15–41)
Albumin: 3.5 g/dL (ref 3.5–5.0)
Alkaline Phosphatase: 47 U/L (ref 38–126)
Anion gap: 8 (ref 5–15)
BUN: 20 mg/dL (ref 6–20)
CO2: 27 mmol/L (ref 22–32)
Calcium: 9.3 mg/dL (ref 8.9–10.3)
Chloride: 106 mmol/L (ref 98–111)
Creatinine, Ser: 0.91 mg/dL (ref 0.44–1.00)
GFR, Estimated: >60 mL/min (ref >60)
Glucose, Bld: 67 mg/dL — ABNORMAL LOW (ref 70–99)
Potassium: 4.5 mmol/L (ref 3.5–5.1)
Sodium: 141 mmol/L (ref 135–145)
Total Bilirubin: 0.3 mg/dL (ref 0.3–1.2)
Total Protein: 6.7 g/dL (ref 6.5–8.1)

## 2021-03-10 LAB — CBC WITH DIFFERENTIAL/PLATELET
Abs Immature Granulocytes: 0.05 10*3/uL (ref 0.00–0.07)
Basophils Absolute: 0.1 10*3/uL (ref 0.0–0.1)
Basophils Relative: 1 %
Eosinophils Absolute: 0.1 10*3/uL (ref 0.0–0.5)
Eosinophils Relative: 1 %
HCT: 41.6 % (ref 36.0–46.0)
Hemoglobin: 13.5 g/dL (ref 12.0–15.0)
Immature Granulocytes: 1 %
Lymphocytes Relative: 18 %
Lymphs Abs: 1.7 10*3/uL (ref 0.7–4.0)
MCH: 30.4 pg (ref 26.0–34.0)
MCHC: 32.5 g/dL (ref 30.0–36.0)
MCV: 93.7 fL (ref 80.0–100.0)
Monocytes Absolute: 0.8 10*3/uL (ref 0.1–1.0)
Monocytes Relative: 9 %
Neutro Abs: 6.3 10*3/uL (ref 1.7–7.7)
Neutrophils Relative %: 70 %
Platelets: 323 10*3/uL (ref 150–400)
RBC: 4.44 MIL/uL (ref 3.87–5.11)
RDW: 13.1 % (ref 11.5–15.5)
WBC: 9 10*3/uL (ref 4.0–10.5)
nRBC: 0 % (ref 0.0–0.2)

## 2021-03-10 MED ORDER — VENLAFAXINE HCL ER 37.5 MG PO CP24: 37.5000 mg | ORAL_CAPSULE | Freq: Every day | ORAL | 4 refills | Status: DC

## 2021-03-10 MED ORDER — DENOSUMAB 60 MG/ML ~~LOC~~ SOSY
60.0000 mg | PREFILLED_SYRINGE | Freq: Once | SUBCUTANEOUS | Status: AC
Start: 2021-03-10 — End: 2021-03-10
  Administered 2021-03-10: 60 mg via SUBCUTANEOUS

## 2021-03-10 MED ORDER — DENOSUMAB 60 MG/ML ~~LOC~~ SOSY
PREFILLED_SYRINGE | SUBCUTANEOUS | Status: AC
  Filled 2021-03-10: qty 1

## 2021-03-10 NOTE — Patient Instructions (Signed)
Denosumab injection What is this medication? DENOSUMAB (den oh sue mab) slows bone breakdown. Prolia is used to treat osteoporosis in women after menopause and in men, and in people who are taking corticosteroids for 6 months or more. Delton See is used to treat a high calcium level due to cancer and to prevent bone fractures and other bone problems caused by multiple myeloma or cancer bone metastases. Delton See is also used totreat giant cell tumor of the bone. This medicine may be used for other purposes; ask your health care provider orpharmacist if you have questions. COMMON BRAND NAME(S): Prolia, XGEVA What should I tell my care team before I take this medication? They need to know if you have any of these conditions: dental disease having surgery or tooth extraction infection kidney disease low levels of calcium or Vitamin D in the blood malnutrition on hemodialysis skin conditions or sensitivity thyroid or parathyroid disease an unusual reaction to denosumab, other medicines, foods, dyes, or preservatives pregnant or trying to get pregnant breast-feeding How should I use this medication? This medicine is for injection under the skin. It is given by a health careprofessional in a hospital or clinic setting. A special MedGuide will be given to you before each treatment. Be sure to readthis information carefully each time. For Prolia, talk to your pediatrician regarding the use of this medicine in children. Special care may be needed. For Delton See, talk to your pediatrician regarding the use of this medicine in children. While this drug may be prescribed for children as young as 13 years for selected conditions,precautions do apply. Overdosage: If you think you have taken too much of this medicine contact apoison control center or emergency room at once. NOTE: This medicine is only for you. Do not share this medicine with others. What if I miss a dose? It is important not to miss your dose. Call  your doctor or health careprofessional if you are unable to keep an appointment. What may interact with this medication? Do not take this medicine with any of the following medications: other medicines containing denosumab This medicine may also interact with the following medications: medicines that lower your chance of fighting infection steroid medicines like prednisone or cortisone This list may not describe all possible interactions. Give your health care provider a list of all the medicines, herbs, non-prescription drugs, or dietary supplements you use. Also tell them if you smoke, drink alcohol, or use illegaldrugs. Some items may interact with your medicine. What should I watch for while using this medication? Visit your doctor or health care professional for regular checks on your progress. Your doctor or health care professional may order blood tests andother tests to see how you are doing. Call your doctor or health care professional for advice if you get a fever, chills or sore throat, or other symptoms of a cold or flu. Do not treat yourself. This drug may decrease your body's ability to fight infection. Try toavoid being around people who are sick. You should make sure you get enough calcium and vitamin D while you are taking this medicine, unless your doctor tells you not to. Discuss the foods you eatand the vitamins you take with your health care professional. See your dentist regularly. Brush and floss your teeth as directed. Before youhave any dental work done, tell your dentist you are receiving this medicine. Do not become pregnant while taking this medicine or for 5 months after stopping it. Talk with your doctor or health care professional about your  birth control options while taking this medicine. Women should inform their doctor if they wish to become pregnant or think they might be pregnant. There is a potential for serious side effects to an unborn child. Talk to your health  careprofessional or pharmacist for more information. What side effects may I notice from receiving this medication? Side effects that you should report to your doctor or health care professionalas soon as possible: allergic reactions like skin rash, itching or hives, swelling of the face, lips, or tongue bone pain breathing problems dizziness jaw pain, especially after dental work redness, blistering, peeling of the skin signs and symptoms of infection like fever or chills; cough; sore throat; pain or trouble passing urine signs of low calcium like fast heartbeat, muscle cramps or muscle pain; pain, tingling, numbness in the hands or feet; seizures unusual bleeding or bruising unusually weak or tired Side effects that usually do not require medical attention (report to yourdoctor or health care professional if they continue or are bothersome): constipation diarrhea headache joint pain loss of appetite muscle pain runny nose tiredness upset stomach This list may not describe all possible side effects. Call your doctor for medical advice about side effects. You may report side effects to FDA at1-800-FDA-1088. Where should I keep my medication? This medicine is only given in a clinic, doctor's office, or other health caresetting and will not be stored at home. NOTE: This sheet is a summary. It may not cover all possible information. If you have questions about this medicine, talk to your doctor, pharmacist, orhealth care provider.  2022 Elsevier/Gold Standard (2017-12-28 16:10:44)

## 2021-03-13 ENCOUNTER — Encounter: Payer: Self-pay | Admitting: Oncology

## 2021-03-14 DIAGNOSIS — Z7189 Other specified counseling: Secondary | ICD-10-CM | POA: Diagnosis not present

## 2021-03-14 DIAGNOSIS — K219 Gastro-esophageal reflux disease without esophagitis: Secondary | ICD-10-CM | POA: Diagnosis not present

## 2021-03-14 NOTE — Telephone Encounter (Signed)
Please advise on patient mychart message  Hi Dr. Chase Caller- hope you are well. I just wanted to know what the Covid protocol is if I get Covid. Who should I call or where should I go.   Lately, I am hearing of people getting it but most seem to be getting over it quickly with an antiviral medication.   I have just moved and I am very rundown. I was laid off from my job and have started a temporary one. My son who is living me with will be starting a new job as well.   Needless to say, I am very nervous about Covid. I will be interacting with more people and certainly my son will be. We have been very lucky not to have had it.to our knowledge anyway, but that is because I have been super careful.   I just wondered what I should do if I get it.   Thank you! Theresa [name]

## 2021-03-23 DIAGNOSIS — J42 Unspecified chronic bronchitis: Secondary | ICD-10-CM | POA: Diagnosis not present

## 2021-03-29 NOTE — Telephone Encounter (Signed)
Reviewed Dr. Ardeen Jourdain recommendations via My Chart message. Nothing further needed at this time.

## 2021-03-29 NOTE — Telephone Encounter (Signed)
Theresa [name] [place] [id number]  [place] she needs to establish with a doc there. Given fact she is immune suppressed  She can do evusheld proph (prevention) MAB. This is every 6 months Then if she gets covid  - she can talk to primary doc or even call us and we can look at paxlovid anti viral - beccause she is immune suppressed - she might need additional Rx MaB  Overall best she establish at [place] or [facility] or [facility] or [facility] there

## 2021-03-29 NOTE — Telephone Encounter (Signed)
Routing My Chart message to Dr. Chase Caller for [place]. Nothing further needed at this time.

## 2021-04-01 ENCOUNTER — Other Ambulatory Visit: Payer: Self-pay | Admitting: *Deleted

## 2021-04-01 ENCOUNTER — Telehealth: Payer: Self-pay | Admitting: *Deleted

## 2021-04-01 DIAGNOSIS — M3130 Wegener's granulomatosis without renal involvement: Secondary | ICD-10-CM

## 2021-04-06 ENCOUNTER — Telehealth: Payer: Self-pay | Admitting: *Deleted

## 2021-04-06 DIAGNOSIS — M3130 Wegener's granulomatosis without renal involvement: Secondary | ICD-10-CM | POA: Diagnosis not present

## 2021-04-06 DIAGNOSIS — M818 Other osteoporosis without current pathological fracture: Secondary | ICD-10-CM | POA: Diagnosis not present

## 2021-04-06 NOTE — Telephone Encounter (Signed)
This RN placed referral per MD to [facility] per pt relocating.   Staff message placed to [facility] HIM 1 with appropriate information per protocol

## 2021-04-07 ENCOUNTER — Telehealth: Payer: Self-pay | Admitting: Oncology

## 2021-04-07 NOTE — Telephone Encounter (Signed)
Release: [id number]  Released records to [facility] to fax # [phone]

## 2021-04-18 DIAGNOSIS — Z1231 Encounter for screening mammogram for malignant neoplasm of breast: Secondary | ICD-10-CM | POA: Diagnosis not present

## 2021-04-19 ENCOUNTER — Telehealth: Payer: Self-pay | Admitting: Internal Medicine

## 2021-04-19 NOTE — Telephone Encounter (Signed)
Patient called and is in need of having the Help at Hand application form filled out for her Dexilant. She stated that she had not paid attention and let it lapsed. She will fax the forms that need to be filled out to the office. She [place] [facility] since the last time she had the forms filled out and will follow up on the best way to get the forms back to her.

## 2021-04-23 DIAGNOSIS — J42 Unspecified chronic bronchitis: Secondary | ICD-10-CM | POA: Diagnosis not present

## 2021-04-24 DIAGNOSIS — Z79899 Other long term (current) drug therapy: Secondary | ICD-10-CM | POA: Diagnosis not present

## 2021-04-24 DIAGNOSIS — R29700 NIHSS score 0: Secondary | ICD-10-CM | POA: Diagnosis not present

## 2021-04-24 DIAGNOSIS — M3130 Wegener's granulomatosis without renal involvement: Secondary | ICD-10-CM | POA: Diagnosis not present

## 2021-04-24 DIAGNOSIS — S0990XA Unspecified injury of head, initial encounter: Secondary | ICD-10-CM | POA: Diagnosis not present

## 2021-04-26 ENCOUNTER — Telehealth: Payer: Self-pay | Admitting: Internal Medicine

## 2021-04-26 NOTE — Telephone Encounter (Signed)
Informed patient we did receive Help at Hand faxed prescription for Dexilant. Dr. Hilarie Fredrickson has filled out the prescription and it is ready to be faxed with the rest of the patient assistance paperwork. Patient asked that I e-mail her prescription to [email or web address]. Prescription e-mailed and informed patient to call the office if she does not receive prescription. Patient verbalized understanding.

## 2021-04-26 NOTE — Telephone Encounter (Signed)
Pt called asking if we received the fax that she sent last week. She stated that she faxed it to [phone].

## 2021-04-27 NOTE — Telephone Encounter (Signed)
Dr Hilarie Fredrickson advised that patient's last office visit was 12/23/18, last endoscopy 06/05/19 and patient recently moved to [facility]. Per Dr Hilarie Fredrickson, ok to fill out forms for Dexilant x 1 year.

## 2021-05-02 DIAGNOSIS — H353131 Nonexudative age-related macular degeneration, bilateral, early dry stage: Secondary | ICD-10-CM | POA: Diagnosis not present

## 2021-05-04 ENCOUNTER — Other Ambulatory Visit: Payer: Self-pay | Admitting: *Deleted

## 2021-05-04 DIAGNOSIS — M3130 Wegener's granulomatosis without renal involvement: Secondary | ICD-10-CM

## 2021-05-04 NOTE — Progress Notes (Unsigned)
Mb  °

## 2021-05-06 ENCOUNTER — Encounter: Payer: Self-pay | Admitting: Oncology

## 2021-05-24 DIAGNOSIS — J42 Unspecified chronic bronchitis: Secondary | ICD-10-CM | POA: Diagnosis not present

## 2021-05-27 DIAGNOSIS — E042 Nontoxic multinodular goiter: Secondary | ICD-10-CM | POA: Diagnosis not present

## 2021-05-27 DIAGNOSIS — Z23 Encounter for immunization: Secondary | ICD-10-CM | POA: Diagnosis not present

## 2021-05-27 DIAGNOSIS — M810 Age-related osteoporosis without current pathological fracture: Secondary | ICD-10-CM | POA: Diagnosis not present

## 2021-05-27 DIAGNOSIS — K219 Gastro-esophageal reflux disease without esophagitis: Secondary | ICD-10-CM | POA: Diagnosis not present

## 2021-05-27 DIAGNOSIS — H3530 Unspecified macular degeneration: Secondary | ICD-10-CM | POA: Diagnosis not present

## 2021-05-27 DIAGNOSIS — G4700 Insomnia, unspecified: Secondary | ICD-10-CM | POA: Diagnosis not present

## 2021-06-07 ENCOUNTER — Telehealth: Payer: Self-pay | Admitting: Internal Medicine

## 2021-06-07 NOTE — Telephone Encounter (Signed)
Pt called requesting to speak with you. she stated that the regimen is not working. PLs call her.

## 2021-06-07 NOTE — Telephone Encounter (Signed)
Pt states that she is taking dexilant 60mg . She has had an unexpected job change and move recently. She has had some breakthrough reflux and has taken prevacid AC for this. She is c/o painful gas below her belly button and she is wanting to know what Dr. Hilarie Fredrickson recommends. Please advise.

## 2021-06-08 NOTE — Telephone Encounter (Signed)
I would advise that she use the Dexilant 60 mg each morning about 30 minutes before her first meal For breakthrough symptoms she can try famotidine 20 mg up to twice daily If she continues to have trouble she should let me

## 2021-06-09 NOTE — Telephone Encounter (Signed)
Spoke with pt and she is aware of Dr. Pyrtle's recommendations. 

## 2021-06-23 DIAGNOSIS — J42 Unspecified chronic bronchitis: Secondary | ICD-10-CM | POA: Diagnosis not present

## 2021-06-28 ENCOUNTER — Encounter: Payer: Self-pay | Admitting: Oncology

## 2021-06-28 NOTE — Telephone Encounter (Signed)
No entry 

## 2021-07-12 ENCOUNTER — Encounter: Payer: Self-pay | Admitting: Gastroenterology

## 2021-07-15 DIAGNOSIS — R109 Unspecified abdominal pain: Secondary | ICD-10-CM | POA: Diagnosis not present

## 2021-07-15 DIAGNOSIS — R140 Abdominal distension (gaseous): Secondary | ICD-10-CM | POA: Diagnosis not present

## 2021-07-15 DIAGNOSIS — E042 Nontoxic multinodular goiter: Secondary | ICD-10-CM | POA: Diagnosis not present

## 2021-07-21 ENCOUNTER — Encounter: Payer: Self-pay | Admitting: Internal Medicine

## 2021-07-24 DIAGNOSIS — J42 Unspecified chronic bronchitis: Secondary | ICD-10-CM | POA: Diagnosis not present

## 2021-08-15 DIAGNOSIS — M3130 Wegener's granulomatosis without renal involvement: Secondary | ICD-10-CM | POA: Diagnosis not present

## 2021-08-15 DIAGNOSIS — J386 Stenosis of larynx: Secondary | ICD-10-CM | POA: Diagnosis not present

## 2021-08-15 DIAGNOSIS — R490 Dysphonia: Secondary | ICD-10-CM | POA: Diagnosis not present

## 2021-08-22 ENCOUNTER — Telehealth: Payer: Self-pay | Admitting: Internal Medicine

## 2021-08-22 NOTE — Telephone Encounter (Signed)
Patient advised forms are on Dr Vena Rua desk for review. If appropriate, will provide a 1 month supply until her appointment with Dr Hilarie Fredrickson 09/09/21.

## 2021-08-22 NOTE — Telephone Encounter (Signed)
Patient called inquiring about the status of the Help at Hand paperwork she faxed to [phone] on 08/12/21.  She hasn't heard anything and it needs to be completed by the end of the month.  Please call patient and let her know.  Thank you.

## 2021-08-23 DIAGNOSIS — J42 Unspecified chronic bronchitis: Secondary | ICD-10-CM | POA: Diagnosis not present

## 2021-08-26 NOTE — Telephone Encounter (Signed)
Physician forms signed and faxed to [facility]. In addition, original document mailed via [employer] to patient.

## 2021-09-09 ENCOUNTER — Encounter: Payer: Self-pay | Admitting: Internal Medicine

## 2021-09-09 ENCOUNTER — Ambulatory Visit: Payer: Medicare Other | Admitting: Internal Medicine

## 2021-09-09 ENCOUNTER — Telehealth: Payer: Self-pay | Admitting: *Deleted

## 2021-09-09 ENCOUNTER — Encounter: Payer: Self-pay | Admitting: Oncology

## 2021-09-09 ENCOUNTER — Telehealth: Payer: Self-pay | Admitting: Internal Medicine

## 2021-09-09 VITALS — BP 118/62 | HR 70 | Ht 66.0 in | Wt 162.0 lb

## 2021-09-09 DIAGNOSIS — K648 Other hemorrhoids: Secondary | ICD-10-CM | POA: Diagnosis not present

## 2021-09-09 DIAGNOSIS — K589 Irritable bowel syndrome without diarrhea: Secondary | ICD-10-CM | POA: Diagnosis not present

## 2021-09-09 DIAGNOSIS — K219 Gastro-esophageal reflux disease without esophagitis: Secondary | ICD-10-CM | POA: Diagnosis not present

## 2021-09-09 DIAGNOSIS — R1030 Lower abdominal pain, unspecified: Secondary | ICD-10-CM | POA: Diagnosis not present

## 2021-09-09 MED ORDER — HYOSCYAMINE SULFATE 0.125 MG SL SUBL: 0.1250 mg | SUBLINGUAL_TABLET | Freq: Three times a day (TID) | SUBLINGUAL | 1 refills | Status: DC | PRN

## 2021-09-09 NOTE — Telephone Encounter (Signed)
Inbound call from patient states insurance does not cover Levsin and asking for an alternative

## 2021-09-09 NOTE — Telephone Encounter (Signed)
Dr Hilarie Fredrickson- Please advise.... do you want me to try dicyclomine?

## 2021-09-09 NOTE — Progress Notes (Signed)
Subjective:    Patient ID: Theresa [name], female    DOB: 1961/07/05, 61 y.o.   MRN: [id number]  HPI Theresa [name] is a 61 year old female with a past medical history of GERD, H. 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  She is here alone today and was last seen in 2020 both in office and for upper endoscopy.  At her last visit an upper endoscopy was performed on 06/05/2019.  This was normal.  The GE junction was somewhat patulous.  She has been maintained on Dexilant 30 mg daily.  We weaned this down from 60 mg.  For the most part her reflux is very well controlled.  There are times when she will have some increase in heartburn this tends to be if she eats later in the day and skips lunch.  She does occasionally use famotidine for breakthrough which seems to help.  No dysphagia or odynophagia.  She has noticed lower abdominal cramping pain which correlates with stress particularly work stress.  This can last for 1 to 2 days and does not always correlate with change in bowel movement.  She does occasionally see bright red, painless rectal bleeding with bowel movement.  This occurs maybe once every few months.  Seems to happen more if she has loose stools or several consecutive bowel movements.  No overt change in bowel habit.  No abdominal pain.  She has moved to [place], [place] and is thinking about getting a Database administrator for convenience.  Her other specialty care is at [facility].  Review of Systems As per HPI, otherwise negative  Current Medications, Allergies, Past Medical History, Past Surgical History, Family History and Social History were reviewed in [employer] record.    Objective:   Physical Exam BP 118/62    Pulse 70    Ht 5\' 6"  (1.676 m)    Wt 162 lb (73.5 kg)    BMI 26.15 kg/m   Gen: awake, alert, NAD HEENT: anicteric  CV: RRR, no mrg Pulm: CTA b/l Abd: soft, NT/ND, +BS throughout ANOSCOPY: Using a disposable, lubricated, slotted, self-illuminating anoscope, the rectum was intubated without difficulty. The trochar was removed and the ano-rectum was circumferentially inspected. There were internal hemorrhoids, RA=LL=RP. There was no finding of an anorectal fissure. The rectal mucosa was not inflamed. No neoplasia or other pathology was identified. The inspection was well tolerated.  Ext: no c/c/e Neuro: nonfocal      Assessment & Plan:  61 year old female with a past medical history of GERD, H. 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  GERD --without esophagitis and no history of Barrett's esophagus.  Symptoms have been mostly well controlled on lower dose Dexilant 30 mg daily.  She occasionally uses famotidine for breakthrough. --Continue Dexilant 30 mg daily --She can use famotidine 20 mg in the evening as needed for breakthrough heartburn  2.  Crampy abdominal pain --most consistent with irritable bowel.  We discussed this today.  I have recommended she try Levsin.  We did briefly discussed inflammatory bowel disease given her history of autoimmune disease, however the intermittent nature of her symptoms fit better with IBS at present. --Levsin 0.125 mg, 1 to 2 tablets sublingual up to 3 times a day as needed for crampy  abdominal pain --Notify me if worsening  3.  Rectal bleeding/internal hemorrhoids --internal hemorrhoids, medium in size, clearly seen by endoscopy today.  This is the most likely cause of intermittent low-volume rectal bleeding.  We discussed hemorrhoidal banding today should this become bothersome but at present she is not interested  4.  CRC screening --she will be due colonoscopy  next year for screening, 2024  5.  Transfer of care --she has moved to the [place] area and I will refer her to see a [facility] doc at [facility].  30 minutes total spent today including patient facing time, coordination of care, reviewing medical history/procedures/pertinent radiology studies, and documentation of the encounter.

## 2021-09-09 NOTE — Telephone Encounter (Signed)
-----   Message from Jerene Bears, MD sent at 09/09/2021  1:11 PM EST ----- [facility] referral to be seen at [facility]: Dr.  Dema Severin, [place] or [facility]  Really can be anyone, but those are names you can use JMP

## 2021-09-09 NOTE — Telephone Encounter (Signed)
Yes,  Dicyclomine  20 mg TIDPRN -- same indication as levsin

## 2021-09-09 NOTE — Patient Instructions (Addendum)
Continue Dexilant 30 mg daily  Please purchase the following medications over the counter and take as directed: Pepcid 20 mg every evening as needed for heartburn Hydrocortisone cream-as needed for hemorrhoids  We have sent the following medications to your pharmacy for you to pick up at your convenience: Levsin SL 1-2 tablets under the tongue every 8 hours as needed for crampy abdominal pain.  If you are age 60 or older, your body mass index should be between 23-30. Your Body mass index is 26.15 kg/m. If this is out of the aforementioned range listed, please consider follow up with your Primary Care Provider.  If you are age 68 or younger, your body mass index should be between 19-25. Your Body mass index is 26.15 kg/m. If this is out of the aformentioned range listed, please consider follow up with your Primary Care Provider.   ________________________________________________________  The [facility] providers would like to encourage you to use [facility] to communicate with providers for non-urgent requests or questions.  Due to long hold times on the telephone, sending your provider a message by [facility] may be a faster and more efficient way to get a response.  Please allow 48 business hours for a response.  Please remember that this is for non-urgent requests.  _______________________________________________________  Due to recent changes in healthcare laws, you may see the results of your imaging and laboratory studies on MyChart before your provider has had a chance to review them.  We understand that in some cases there may be results that are confusing or concerning to you. Not all laboratory results come back in the same time frame and the provider may be waiting for multiple results in order to interpret others.  Please give [place] 48 hours in order for your provider to thoroughly review all the results before contacting the office for clarification of your results.

## 2021-09-09 NOTE — Telephone Encounter (Signed)
22 pages of records, demographic information faxed to [phone] for patient change of care to [facility] at [facility]. Will await appointment information.

## 2021-09-12 MED ORDER — DICYCLOMINE HCL 10 MG PO CAPS: 10.0000 mg | ORAL_CAPSULE | Freq: Three times a day (TID) | ORAL | 0 refills | Status: DC | PRN

## 2021-09-12 MED ORDER — DICYCLOMINE HCL 20 MG PO TABS: 20.0000 mg | ORAL_TABLET | Freq: Three times a day (TID) | ORAL | 0 refills | Status: AC | PRN

## 2021-09-12 NOTE — Telephone Encounter (Signed)
Dicyclomine 20 mg has been sent to patient's pharmacy. Voicemail left on patient's phone to lt her know.

## 2021-09-14 NOTE — Telephone Encounter (Signed)
Patient has been approved for [employer] Patient Assistance Program until September 03, 2022. She will receive her first order in the next few days.

## 2021-10-11 NOTE — Telephone Encounter (Signed)
I have left a message for patient advising that I have been in contact with [facility] and am told that a referral/records have been received and that patient may call to schedule an appointment. I have advised that she may call [phone] to schedule.

## 2021-10-20 ENCOUNTER — Other Ambulatory Visit: Payer: Self-pay | Admitting: Rheumatology

## 2021-10-20 DIAGNOSIS — M810 Age-related osteoporosis without current pathological fracture: Secondary | ICD-10-CM

## 2021-12-11 DIAGNOSIS — R350 Frequency of micturition: Secondary | ICD-10-CM | POA: Diagnosis not present

## 2021-12-29 DIAGNOSIS — M7741 Metatarsalgia, right foot: Secondary | ICD-10-CM | POA: Diagnosis not present

## 2021-12-29 DIAGNOSIS — M222X1 Patellofemoral disorders, right knee: Secondary | ICD-10-CM | POA: Diagnosis not present

## 2021-12-29 DIAGNOSIS — M222X2 Patellofemoral disorders, left knee: Secondary | ICD-10-CM | POA: Diagnosis not present

## 2021-12-29 DIAGNOSIS — M47812 Spondylosis without myelopathy or radiculopathy, cervical region: Secondary | ICD-10-CM | POA: Diagnosis not present

## 2022-01-04 DIAGNOSIS — M79671 Pain in right foot: Secondary | ICD-10-CM | POA: Diagnosis not present

## 2022-01-04 DIAGNOSIS — M810 Age-related osteoporosis without current pathological fracture: Secondary | ICD-10-CM | POA: Diagnosis not present

## 2022-01-06 DIAGNOSIS — M79671 Pain in right foot: Secondary | ICD-10-CM | POA: Diagnosis not present

## 2022-01-12 DIAGNOSIS — M84374A Stress fracture, right foot, initial encounter for fracture: Secondary | ICD-10-CM | POA: Diagnosis not present

## 2022-01-26 DIAGNOSIS — K219 Gastro-esophageal reflux disease without esophagitis: Secondary | ICD-10-CM | POA: Diagnosis not present

## 2022-01-26 DIAGNOSIS — G4700 Insomnia, unspecified: Secondary | ICD-10-CM | POA: Diagnosis not present

## 2022-01-26 DIAGNOSIS — E042 Nontoxic multinodular goiter: Secondary | ICD-10-CM | POA: Diagnosis not present

## 2022-01-26 DIAGNOSIS — M3130 Wegener's granulomatosis without renal involvement: Secondary | ICD-10-CM | POA: Diagnosis not present

## 2022-01-26 DIAGNOSIS — M810 Age-related osteoporosis without current pathological fracture: Secondary | ICD-10-CM | POA: Diagnosis not present

## 2022-01-31 DIAGNOSIS — M3130 Wegener's granulomatosis without renal involvement: Secondary | ICD-10-CM | POA: Diagnosis not present

## 2022-01-31 DIAGNOSIS — H353131 Nonexudative age-related macular degeneration, bilateral, early dry stage: Secondary | ICD-10-CM | POA: Diagnosis not present

## 2022-02-09 DIAGNOSIS — M84374D Stress fracture, right foot, subsequent encounter for fracture with routine healing: Secondary | ICD-10-CM | POA: Diagnosis not present

## 2022-03-07 DIAGNOSIS — M25572 Pain in left ankle and joints of left foot: Secondary | ICD-10-CM | POA: Diagnosis not present

## 2022-03-09 ENCOUNTER — Encounter: Payer: Self-pay | Admitting: Internal Medicine

## 2022-03-21 ENCOUNTER — Ambulatory Visit
Admission: RE | Admit: 2022-03-21 | Discharge: 2022-03-21 | Disposition: A | Discharge status: Home / Self Care | Payer: Medicare Other | Source: Ambulatory Visit | Attending: Rheumatology | Admitting: Rheumatology

## 2022-03-21 DIAGNOSIS — M8589 Other specified disorders of bone density and structure, multiple sites: Secondary | ICD-10-CM | POA: Diagnosis not present

## 2022-03-21 DIAGNOSIS — M810 Age-related osteoporosis without current pathological fracture: Secondary | ICD-10-CM

## 2022-03-21 DIAGNOSIS — Z780 Asymptomatic menopausal state: Secondary | ICD-10-CM | POA: Diagnosis not present

## 2022-03-30 DIAGNOSIS — M25572 Pain in left ankle and joints of left foot: Secondary | ICD-10-CM | POA: Diagnosis not present

## 2022-03-30 IMAGING — US US THYROID
1 series · 9 of 9 positions shown · non-contrast
Comparison: 05/21/2020

CLINICAL DATA: Thyroid nodules 2 cm right inferior TR 4 nodule,
plan for biopsy.

EXAM:
THYROID ULTRASOUND
TECHNIQUE: Ultrasound examination of the thyroid gland and adjacent soft
tissues was performed.

[Series 1: us thyroid · 0.06mm/px · 9 acquisitions, 9 frames shown]
[im 1/9]
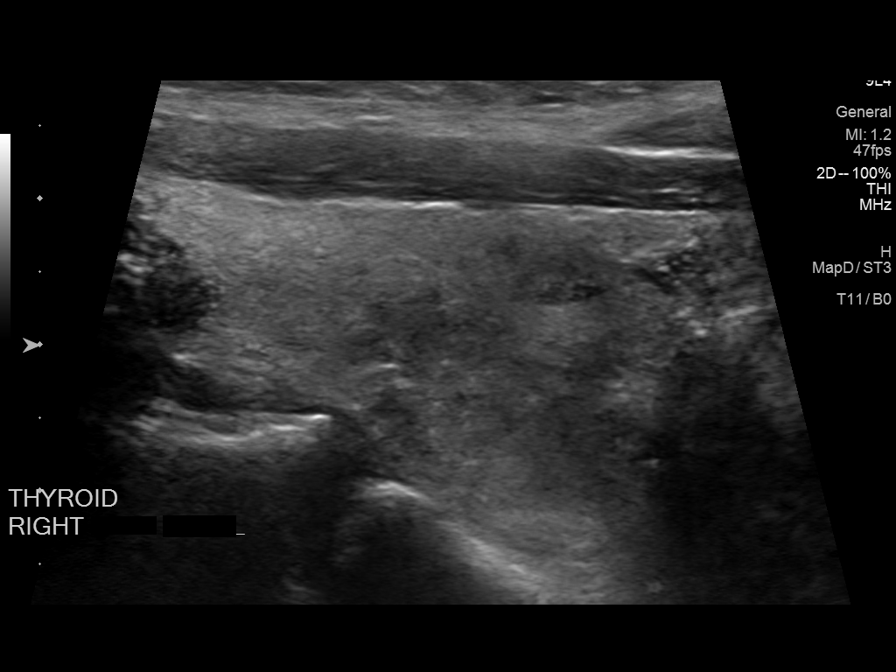
[im 2/9]
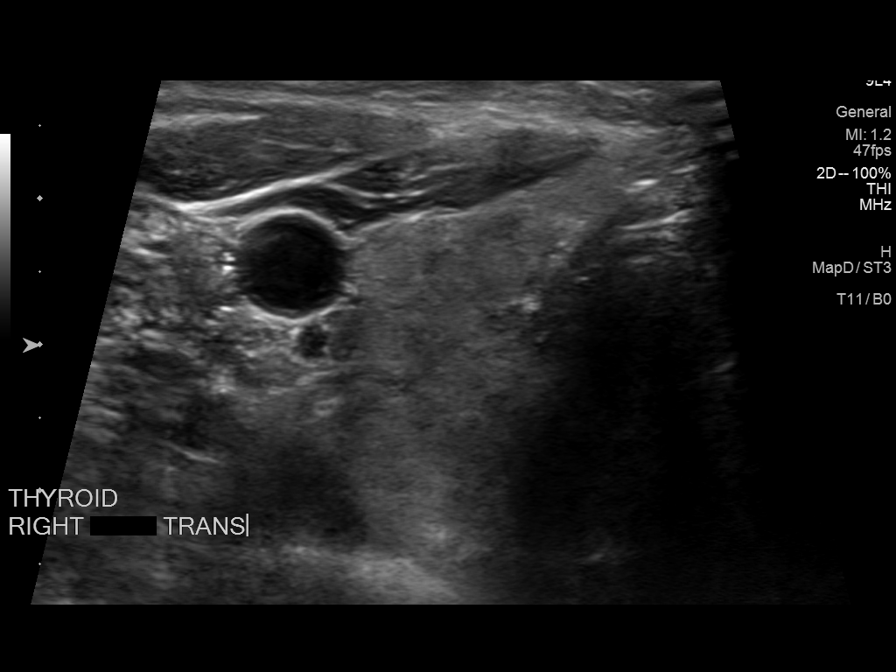
[im 3/9]
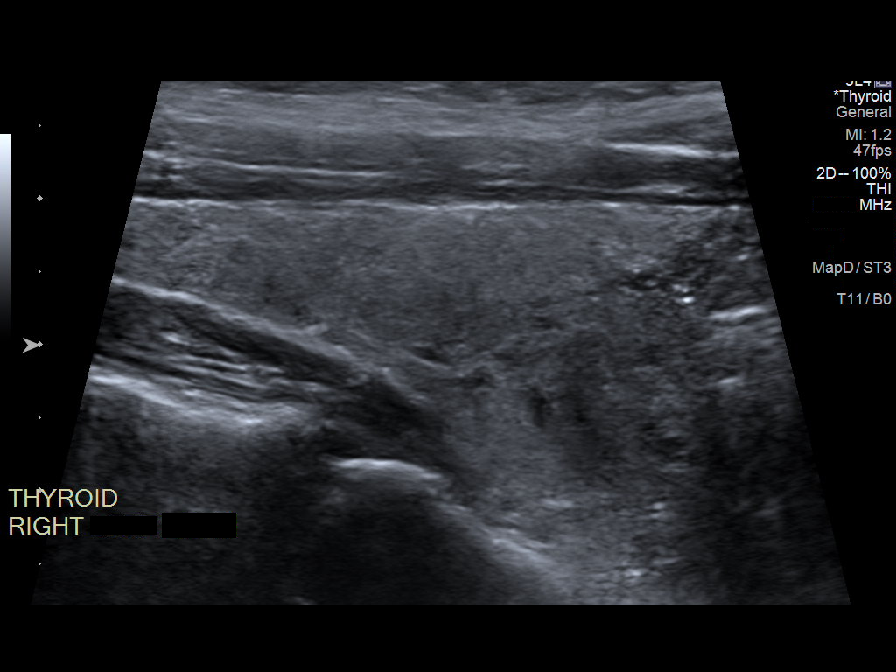
[im 4/9]
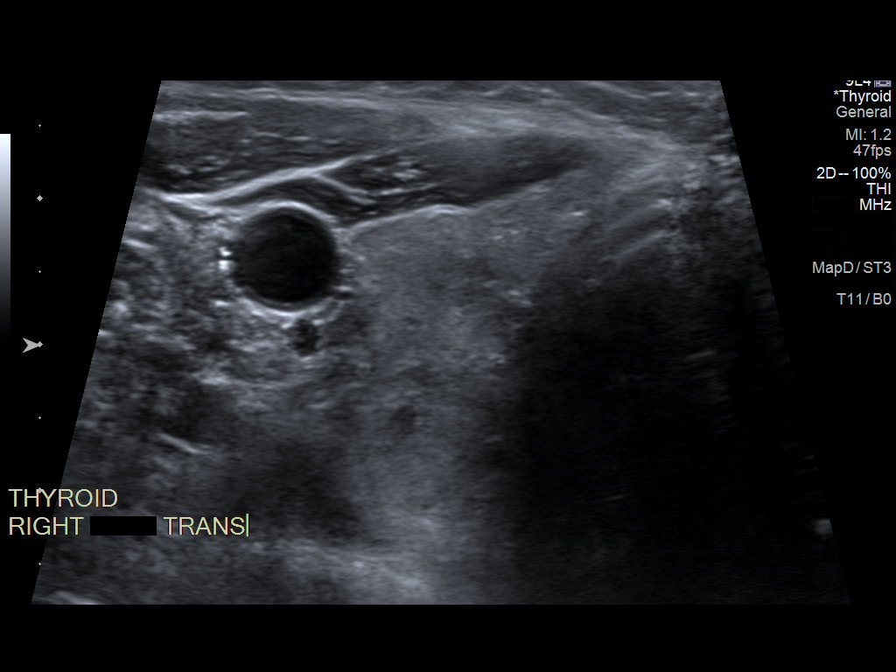
[im 5/9]
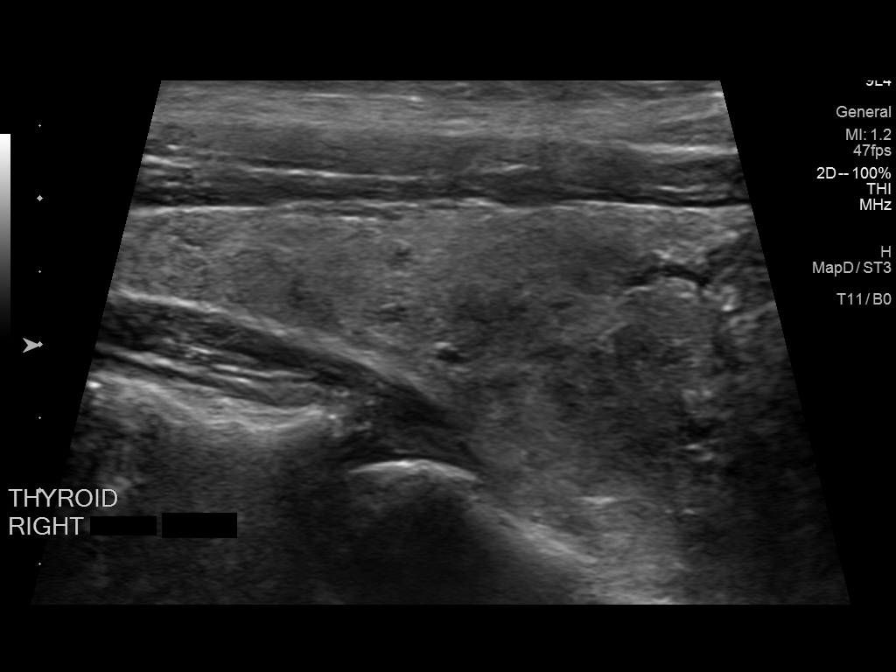
[im 6/9]
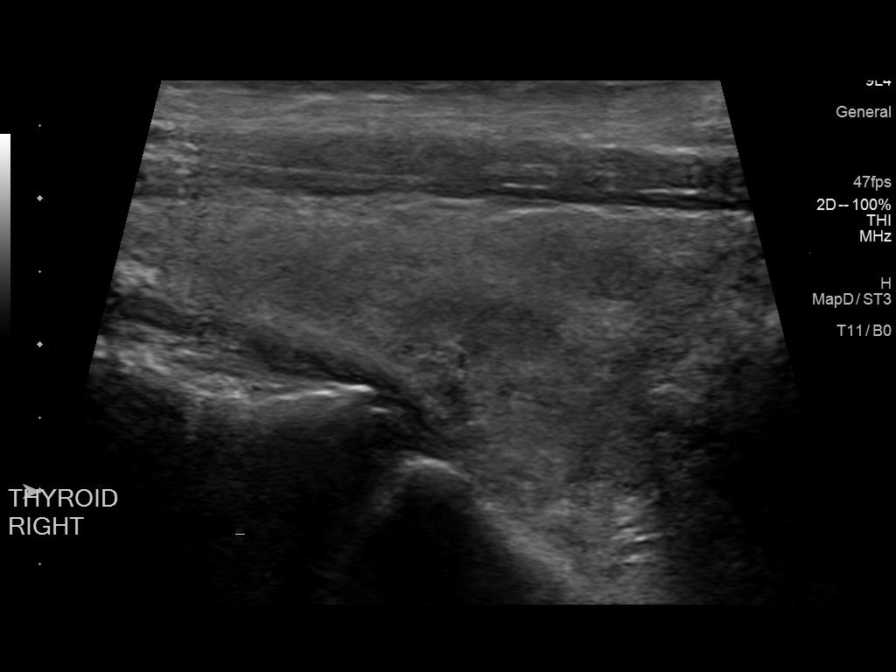
[im 7/9]
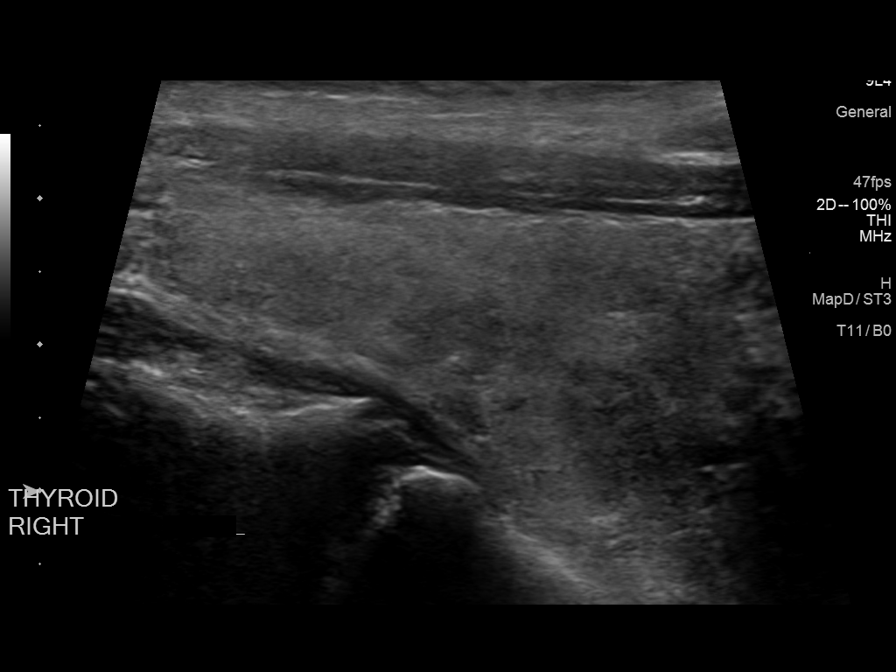
[im 8/9]
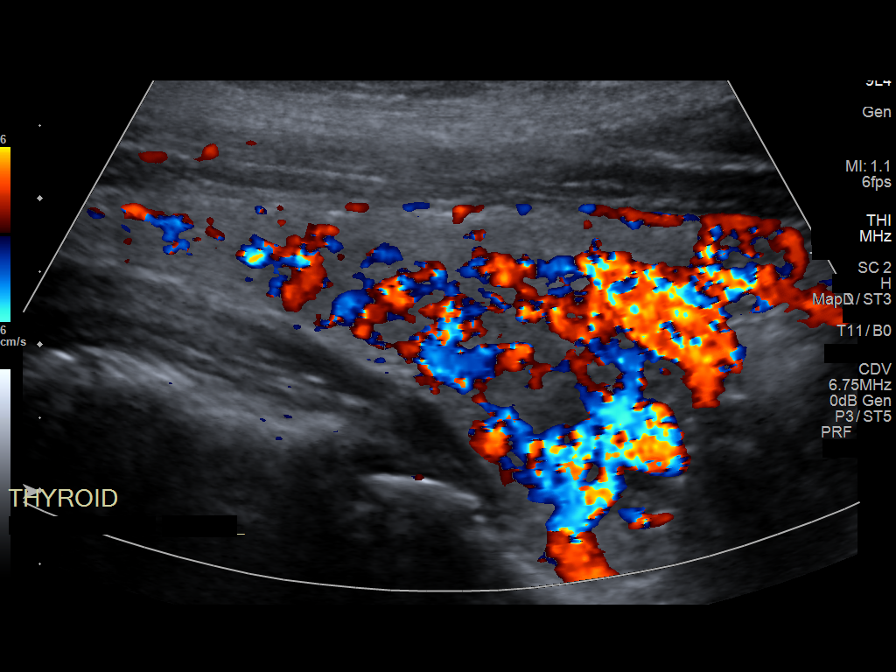
[im 9/9]
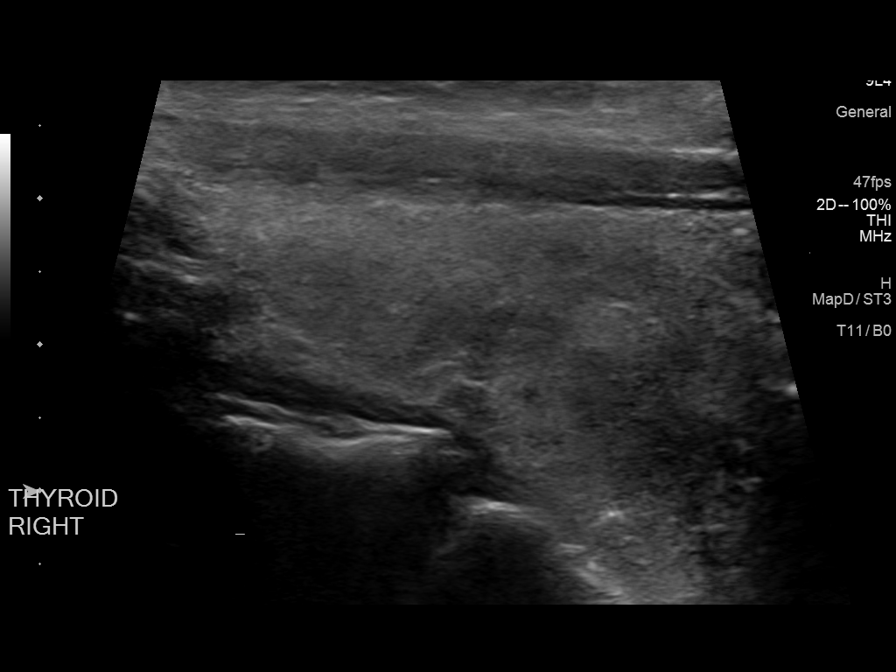

[9 of 9 positions shown; findings below may reference images not displayed]

FINDINGS: Parenchymal Echotexture: Mildly heterogenous

Ultrasound performed of the right thyroid lobe in preparation for
biopsy of the previously described right inferior 2 cm TR 4 nodule
(nodule 3). Ultrasound today demonstrates this to be a right
inferior pseudo nodule. No discrete borders demonstrated. Therefore
biopsy not performed.
IMPRESSION: Previously described 2 cm right inferior TR 4 nodule (nodule 3)
represents a pseudo nodule by ultrasound today. Biopsy not
performed. Findings discussed with the patient.

As previously recommended the 1 cm left inferior TR 4 nodule meets
criteria for follow-up in 1 year.

The above is in keeping with the ACR TI-RADS recommendations - [HOSPITAL] 0047;[DATE].

## 2022-04-10 DIAGNOSIS — M3130 Wegener's granulomatosis without renal involvement: Secondary | ICD-10-CM | POA: Diagnosis not present

## 2022-04-11 DIAGNOSIS — M3131 Wegener's granulomatosis with renal involvement: Secondary | ICD-10-CM | POA: Diagnosis not present

## 2022-05-09 DIAGNOSIS — L570 Actinic keratosis: Secondary | ICD-10-CM | POA: Diagnosis not present

## 2022-05-09 DIAGNOSIS — L821 Other seborrheic keratosis: Secondary | ICD-10-CM | POA: Diagnosis not present

## 2022-05-09 DIAGNOSIS — L820 Inflamed seborrheic keratosis: Secondary | ICD-10-CM | POA: Diagnosis not present

## 2022-05-09 DIAGNOSIS — L814 Other melanin hyperpigmentation: Secondary | ICD-10-CM | POA: Diagnosis not present

## 2022-05-09 DIAGNOSIS — D235 Other benign neoplasm of skin of trunk: Secondary | ICD-10-CM | POA: Diagnosis not present

## 2022-05-09 DIAGNOSIS — L579 Skin changes due to chronic exposure to nonionizing radiation, unspecified: Secondary | ICD-10-CM | POA: Diagnosis not present

## 2022-05-09 DIAGNOSIS — L299 Pruritus, unspecified: Secondary | ICD-10-CM | POA: Diagnosis not present

## 2022-05-12 DIAGNOSIS — M3130 Wegener's granulomatosis without renal involvement: Secondary | ICD-10-CM | POA: Diagnosis not present

## 2022-05-12 DIAGNOSIS — Z79899 Other long term (current) drug therapy: Secondary | ICD-10-CM | POA: Diagnosis not present

## 2022-06-07 DIAGNOSIS — K219 Gastro-esophageal reflux disease without esophagitis: Secondary | ICD-10-CM | POA: Diagnosis not present

## 2022-06-15 DIAGNOSIS — Z1231 Encounter for screening mammogram for malignant neoplasm of breast: Secondary | ICD-10-CM | POA: Diagnosis not present

## 2022-06-22 DIAGNOSIS — M810 Age-related osteoporosis without current pathological fracture: Secondary | ICD-10-CM | POA: Diagnosis not present

## 2022-06-26 DIAGNOSIS — Z7951 Long term (current) use of inhaled steroids: Secondary | ICD-10-CM | POA: Diagnosis not present

## 2022-06-26 DIAGNOSIS — S6992XA Unspecified injury of left wrist, hand and finger(s), initial encounter: Secondary | ICD-10-CM | POA: Diagnosis not present

## 2022-06-26 DIAGNOSIS — Z79899 Other long term (current) drug therapy: Secondary | ICD-10-CM | POA: Diagnosis not present

## 2022-06-26 DIAGNOSIS — S52592A Other fractures of lower end of left radius, initial encounter for closed fracture: Secondary | ICD-10-CM | POA: Diagnosis not present

## 2022-06-26 DIAGNOSIS — M3130 Wegener's granulomatosis without renal involvement: Secondary | ICD-10-CM | POA: Diagnosis not present

## 2022-06-27 DIAGNOSIS — Z7951 Long term (current) use of inhaled steroids: Secondary | ICD-10-CM | POA: Diagnosis not present

## 2022-06-27 DIAGNOSIS — S52592D Other fractures of lower end of left radius, subsequent encounter for closed fracture with routine healing: Secondary | ICD-10-CM | POA: Diagnosis not present

## 2022-06-27 DIAGNOSIS — Z79899 Other long term (current) drug therapy: Secondary | ICD-10-CM | POA: Diagnosis not present

## 2022-06-27 DIAGNOSIS — S52502D Unspecified fracture of the lower end of left radius, subsequent encounter for closed fracture with routine healing: Secondary | ICD-10-CM | POA: Diagnosis not present

## 2022-06-27 DIAGNOSIS — M25532 Pain in left wrist: Secondary | ICD-10-CM | POA: Diagnosis not present

## 2022-06-28 DIAGNOSIS — S52542A Smith's fracture of left radius, initial encounter for closed fracture: Secondary | ICD-10-CM | POA: Diagnosis not present

## 2022-06-28 DIAGNOSIS — M25532 Pain in left wrist: Secondary | ICD-10-CM | POA: Diagnosis not present

## 2022-06-29 DIAGNOSIS — Y9301 Activity, walking, marching and hiking: Secondary | ICD-10-CM | POA: Diagnosis not present

## 2022-06-29 DIAGNOSIS — M25532 Pain in left wrist: Secondary | ICD-10-CM | POA: Diagnosis not present

## 2022-06-29 DIAGNOSIS — W1839XA Other fall on same level, initial encounter: Secondary | ICD-10-CM | POA: Diagnosis not present

## 2022-06-29 DIAGNOSIS — S52572A Other intraarticular fracture of lower end of left radius, initial encounter for closed fracture: Secondary | ICD-10-CM | POA: Diagnosis not present

## 2022-07-04 DIAGNOSIS — M3130 Wegener's granulomatosis without renal involvement: Secondary | ICD-10-CM | POA: Diagnosis not present

## 2022-07-07 DIAGNOSIS — G8918 Other acute postprocedural pain: Secondary | ICD-10-CM | POA: Diagnosis not present

## 2022-07-07 DIAGNOSIS — W1839XA Other fall on same level, initial encounter: Secondary | ICD-10-CM | POA: Diagnosis not present

## 2022-07-07 DIAGNOSIS — Z79899 Other long term (current) drug therapy: Secondary | ICD-10-CM | POA: Diagnosis not present

## 2022-07-07 DIAGNOSIS — S52572A Other intraarticular fracture of lower end of left radius, initial encounter for closed fracture: Secondary | ICD-10-CM | POA: Diagnosis not present

## 2022-07-12 DIAGNOSIS — E042 Nontoxic multinodular goiter: Secondary | ICD-10-CM | POA: Diagnosis not present

## 2022-07-12 DIAGNOSIS — M810 Age-related osteoporosis without current pathological fracture: Secondary | ICD-10-CM | POA: Diagnosis not present

## 2022-07-12 DIAGNOSIS — M818 Other osteoporosis without current pathological fracture: Secondary | ICD-10-CM | POA: Diagnosis not present

## 2022-07-12 DIAGNOSIS — T50905A Adverse effect of unspecified drugs, medicaments and biological substances, initial encounter: Secondary | ICD-10-CM | POA: Diagnosis not present

## 2022-07-14 DIAGNOSIS — S52572A Other intraarticular fracture of lower end of left radius, initial encounter for closed fracture: Secondary | ICD-10-CM | POA: Diagnosis not present

## 2022-07-14 DIAGNOSIS — Z4789 Encounter for other orthopedic aftercare: Secondary | ICD-10-CM | POA: Diagnosis not present

## 2022-07-14 DIAGNOSIS — M1810 Unilateral primary osteoarthritis of first carpometacarpal joint, unspecified hand: Secondary | ICD-10-CM | POA: Diagnosis not present

## 2022-07-14 DIAGNOSIS — Y9301 Activity, walking, marching and hiking: Secondary | ICD-10-CM | POA: Diagnosis not present

## 2022-07-14 DIAGNOSIS — M1811 Unilateral primary osteoarthritis of first carpometacarpal joint, right hand: Secondary | ICD-10-CM | POA: Diagnosis not present

## 2022-07-14 DIAGNOSIS — W1839XA Other fall on same level, initial encounter: Secondary | ICD-10-CM | POA: Diagnosis not present

## 2022-07-20 DIAGNOSIS — Z4789 Encounter for other orthopedic aftercare: Secondary | ICD-10-CM | POA: Diagnosis not present

## 2022-07-25 DIAGNOSIS — Z4789 Encounter for other orthopedic aftercare: Secondary | ICD-10-CM | POA: Diagnosis not present

## 2022-07-31 DIAGNOSIS — Z4789 Encounter for other orthopedic aftercare: Secondary | ICD-10-CM | POA: Diagnosis not present

## 2022-08-02 DIAGNOSIS — Z4789 Encounter for other orthopedic aftercare: Secondary | ICD-10-CM | POA: Diagnosis not present

## 2022-08-04 DIAGNOSIS — E7800 Pure hypercholesterolemia, unspecified: Secondary | ICD-10-CM | POA: Diagnosis not present

## 2022-08-04 DIAGNOSIS — M3130 Wegener's granulomatosis without renal involvement: Secondary | ICD-10-CM | POA: Diagnosis not present

## 2022-08-04 DIAGNOSIS — E042 Nontoxic multinodular goiter: Secondary | ICD-10-CM | POA: Diagnosis not present

## 2022-08-04 DIAGNOSIS — K219 Gastro-esophageal reflux disease without esophagitis: Secondary | ICD-10-CM | POA: Diagnosis not present

## 2022-08-04 DIAGNOSIS — M810 Age-related osteoporosis without current pathological fracture: Secondary | ICD-10-CM | POA: Diagnosis not present

## 2022-08-04 DIAGNOSIS — G4700 Insomnia, unspecified: Secondary | ICD-10-CM | POA: Diagnosis not present

## 2022-08-10 DIAGNOSIS — W1839XA Other fall on same level, initial encounter: Secondary | ICD-10-CM | POA: Diagnosis not present

## 2022-08-10 DIAGNOSIS — M1810 Unilateral primary osteoarthritis of first carpometacarpal joint, unspecified hand: Secondary | ICD-10-CM | POA: Diagnosis not present

## 2022-08-10 DIAGNOSIS — Y9301 Activity, walking, marching and hiking: Secondary | ICD-10-CM | POA: Diagnosis not present

## 2022-08-10 DIAGNOSIS — S52572A Other intraarticular fracture of lower end of left radius, initial encounter for closed fracture: Secondary | ICD-10-CM | POA: Diagnosis not present

## 2022-08-10 DIAGNOSIS — Z4789 Encounter for other orthopedic aftercare: Secondary | ICD-10-CM | POA: Diagnosis not present

## 2022-08-14 DIAGNOSIS — R490 Dysphonia: Secondary | ICD-10-CM | POA: Diagnosis not present

## 2022-08-14 DIAGNOSIS — J4540 Moderate persistent asthma, uncomplicated: Secondary | ICD-10-CM | POA: Diagnosis not present

## 2022-08-14 DIAGNOSIS — J386 Stenosis of larynx: Secondary | ICD-10-CM | POA: Diagnosis not present

## 2022-08-14 DIAGNOSIS — M810 Age-related osteoporosis without current pathological fracture: Secondary | ICD-10-CM | POA: Diagnosis not present

## 2022-08-14 DIAGNOSIS — M3130 Wegener's granulomatosis without renal involvement: Secondary | ICD-10-CM | POA: Diagnosis not present

## 2022-08-14 DIAGNOSIS — Z4789 Encounter for other orthopedic aftercare: Secondary | ICD-10-CM | POA: Diagnosis not present

## 2022-08-15 DIAGNOSIS — M3130 Wegener's granulomatosis without renal involvement: Secondary | ICD-10-CM | POA: Diagnosis not present

## 2022-08-15 DIAGNOSIS — H353131 Nonexudative age-related macular degeneration, bilateral, early dry stage: Secondary | ICD-10-CM | POA: Diagnosis not present

## 2022-08-18 DIAGNOSIS — Z4789 Encounter for other orthopedic aftercare: Secondary | ICD-10-CM | POA: Diagnosis not present

## 2022-08-18 DIAGNOSIS — M25632 Stiffness of left wrist, not elsewhere classified: Secondary | ICD-10-CM | POA: Diagnosis not present

## 2022-08-21 DIAGNOSIS — M25632 Stiffness of left wrist, not elsewhere classified: Secondary | ICD-10-CM | POA: Diagnosis not present

## 2022-08-23 DIAGNOSIS — M950 Acquired deformity of nose: Secondary | ICD-10-CM | POA: Diagnosis not present

## 2022-08-23 DIAGNOSIS — M3130 Wegener's granulomatosis without renal involvement: Secondary | ICD-10-CM | POA: Diagnosis not present

## 2022-08-23 DIAGNOSIS — R930 Abnormal findings on diagnostic imaging of skull and head, not elsewhere classified: Secondary | ICD-10-CM | POA: Diagnosis not present

## 2022-08-25 DIAGNOSIS — Z4789 Encounter for other orthopedic aftercare: Secondary | ICD-10-CM | POA: Diagnosis not present

## 2022-08-29 DIAGNOSIS — Z4789 Encounter for other orthopedic aftercare: Secondary | ICD-10-CM | POA: Diagnosis not present

## 2022-08-31 DIAGNOSIS — Z4789 Encounter for other orthopedic aftercare: Secondary | ICD-10-CM | POA: Diagnosis not present

## 2022-09-06 DIAGNOSIS — Z4789 Encounter for other orthopedic aftercare: Secondary | ICD-10-CM | POA: Diagnosis not present

## 2022-09-12 DIAGNOSIS — Z713 Dietary counseling and surveillance: Secondary | ICD-10-CM | POA: Diagnosis not present

## 2022-09-12 DIAGNOSIS — R140 Abdominal distension (gaseous): Secondary | ICD-10-CM | POA: Diagnosis not present

## 2022-09-12 DIAGNOSIS — K219 Gastro-esophageal reflux disease without esophagitis: Secondary | ICD-10-CM | POA: Diagnosis not present

## 2022-09-13 DIAGNOSIS — Z4789 Encounter for other orthopedic aftercare: Secondary | ICD-10-CM | POA: Diagnosis not present

## 2022-09-19 DIAGNOSIS — Z4789 Encounter for other orthopedic aftercare: Secondary | ICD-10-CM | POA: Diagnosis not present

## 2022-09-21 DIAGNOSIS — Z4789 Encounter for other orthopedic aftercare: Secondary | ICD-10-CM | POA: Diagnosis not present

## 2022-09-26 DIAGNOSIS — Z4789 Encounter for other orthopedic aftercare: Secondary | ICD-10-CM | POA: Diagnosis not present

## 2022-09-28 DIAGNOSIS — M3130 Wegener's granulomatosis without renal involvement: Secondary | ICD-10-CM | POA: Diagnosis not present

## 2022-09-28 DIAGNOSIS — Z4789 Encounter for other orthopedic aftercare: Secondary | ICD-10-CM | POA: Diagnosis not present

## 2022-10-03 DIAGNOSIS — M818 Other osteoporosis without current pathological fracture: Secondary | ICD-10-CM | POA: Diagnosis not present

## 2022-10-03 DIAGNOSIS — L249 Irritant contact dermatitis, unspecified cause: Secondary | ICD-10-CM | POA: Diagnosis not present

## 2022-10-03 DIAGNOSIS — Z4789 Encounter for other orthopedic aftercare: Secondary | ICD-10-CM | POA: Diagnosis not present

## 2022-10-03 DIAGNOSIS — L814 Other melanin hyperpigmentation: Secondary | ICD-10-CM | POA: Diagnosis not present

## 2022-10-03 DIAGNOSIS — D225 Melanocytic nevi of trunk: Secondary | ICD-10-CM | POA: Diagnosis not present

## 2022-10-03 DIAGNOSIS — L579 Skin changes due to chronic exposure to nonionizing radiation, unspecified: Secondary | ICD-10-CM | POA: Diagnosis not present

## 2022-10-03 DIAGNOSIS — M810 Age-related osteoporosis without current pathological fracture: Secondary | ICD-10-CM | POA: Diagnosis not present

## 2022-10-03 DIAGNOSIS — L821 Other seborrheic keratosis: Secondary | ICD-10-CM | POA: Diagnosis not present

## 2022-10-03 DIAGNOSIS — L820 Inflamed seborrheic keratosis: Secondary | ICD-10-CM | POA: Diagnosis not present

## 2022-10-03 DIAGNOSIS — T50905A Adverse effect of unspecified drugs, medicaments and biological substances, initial encounter: Secondary | ICD-10-CM | POA: Diagnosis not present

## 2022-10-06 DIAGNOSIS — Z4789 Encounter for other orthopedic aftercare: Secondary | ICD-10-CM | POA: Diagnosis not present

## 2022-10-10 DIAGNOSIS — Z4789 Encounter for other orthopedic aftercare: Secondary | ICD-10-CM | POA: Diagnosis not present

## 2022-10-12 DIAGNOSIS — Z4789 Encounter for other orthopedic aftercare: Secondary | ICD-10-CM | POA: Diagnosis not present

## 2022-10-17 DIAGNOSIS — R200 Anesthesia of skin: Secondary | ICD-10-CM | POA: Diagnosis not present

## 2022-10-17 DIAGNOSIS — G5622 Lesion of ulnar nerve, left upper limb: Secondary | ICD-10-CM | POA: Diagnosis not present

## 2022-10-17 DIAGNOSIS — R202 Paresthesia of skin: Secondary | ICD-10-CM | POA: Diagnosis not present

## 2022-10-18 DIAGNOSIS — Z4789 Encounter for other orthopedic aftercare: Secondary | ICD-10-CM | POA: Diagnosis not present

## 2022-10-20 DIAGNOSIS — Z4789 Encounter for other orthopedic aftercare: Secondary | ICD-10-CM | POA: Diagnosis not present

## 2022-10-23 DIAGNOSIS — Z4789 Encounter for other orthopedic aftercare: Secondary | ICD-10-CM | POA: Diagnosis not present

## 2022-10-26 DIAGNOSIS — S52572D Other intraarticular fracture of lower end of left radius, subsequent encounter for closed fracture with routine healing: Secondary | ICD-10-CM | POA: Diagnosis not present

## 2022-10-26 DIAGNOSIS — W1839XD Other fall on same level, subsequent encounter: Secondary | ICD-10-CM | POA: Diagnosis not present

## 2022-10-30 DIAGNOSIS — Z4789 Encounter for other orthopedic aftercare: Secondary | ICD-10-CM | POA: Diagnosis not present

## 2022-10-31 DIAGNOSIS — E042 Nontoxic multinodular goiter: Secondary | ICD-10-CM | POA: Diagnosis not present

## 2022-11-06 DIAGNOSIS — Z4789 Encounter for other orthopedic aftercare: Secondary | ICD-10-CM | POA: Diagnosis not present

## 2022-11-14 DIAGNOSIS — Z4789 Encounter for other orthopedic aftercare: Secondary | ICD-10-CM | POA: Diagnosis not present

## 2022-11-23 DIAGNOSIS — Z4789 Encounter for other orthopedic aftercare: Secondary | ICD-10-CM | POA: Diagnosis not present

## 2022-11-28 DIAGNOSIS — Z4789 Encounter for other orthopedic aftercare: Secondary | ICD-10-CM | POA: Diagnosis not present

## 2022-11-30 DIAGNOSIS — Z79899 Other long term (current) drug therapy: Secondary | ICD-10-CM | POA: Diagnosis not present

## 2022-11-30 DIAGNOSIS — I7782 Antineutrophilic cytoplasmic antibody (ANCA) vasculitis: Secondary | ICD-10-CM | POA: Diagnosis not present

## 2022-11-30 DIAGNOSIS — M810 Age-related osteoporosis without current pathological fracture: Secondary | ICD-10-CM | POA: Diagnosis not present

## 2022-11-30 DIAGNOSIS — R0602 Shortness of breath: Secondary | ICD-10-CM | POA: Diagnosis not present

## 2022-11-30 DIAGNOSIS — J386 Stenosis of larynx: Secondary | ICD-10-CM | POA: Diagnosis not present

## 2022-12-05 DIAGNOSIS — Z4789 Encounter for other orthopedic aftercare: Secondary | ICD-10-CM | POA: Diagnosis not present

## 2022-12-12 DIAGNOSIS — Z4789 Encounter for other orthopedic aftercare: Secondary | ICD-10-CM | POA: Diagnosis not present

## 2022-12-21 DIAGNOSIS — E042 Nontoxic multinodular goiter: Secondary | ICD-10-CM | POA: Diagnosis not present

## 2022-12-29 DIAGNOSIS — Z4789 Encounter for other orthopedic aftercare: Secondary | ICD-10-CM | POA: Diagnosis not present

## 2023-01-04 DIAGNOSIS — Z4789 Encounter for other orthopedic aftercare: Secondary | ICD-10-CM | POA: Diagnosis not present

## 2023-01-17 ENCOUNTER — Telehealth: Payer: Self-pay | Admitting: Internal Medicine

## 2023-01-17 NOTE — Telephone Encounter (Signed)
Good Afternoon Dr Rhea Belton   We received a call from patient requesting to transfer care back to you after being referred to [facility]. Patient states she is having a hard time being seen by provider for care. And would rather come back since you are familiar with her care. Please advise on scheduling.   Thank you

## 2023-01-18 DIAGNOSIS — M3130 Wegener's granulomatosis without renal involvement: Secondary | ICD-10-CM | POA: Diagnosis not present

## 2023-01-18 DIAGNOSIS — Z4789 Encounter for other orthopedic aftercare: Secondary | ICD-10-CM | POA: Diagnosis not present

## 2023-01-18 NOTE — Telephone Encounter (Signed)
Yes , ok to be seen

## 2023-01-22 DIAGNOSIS — E7800 Pure hypercholesterolemia, unspecified: Secondary | ICD-10-CM | POA: Diagnosis not present

## 2023-01-22 DIAGNOSIS — K219 Gastro-esophageal reflux disease without esophagitis: Secondary | ICD-10-CM | POA: Diagnosis not present

## 2023-01-22 DIAGNOSIS — G629 Polyneuropathy, unspecified: Secondary | ICD-10-CM | POA: Diagnosis not present

## 2023-01-22 DIAGNOSIS — G4700 Insomnia, unspecified: Secondary | ICD-10-CM | POA: Diagnosis not present

## 2023-01-22 DIAGNOSIS — M3130 Wegener's granulomatosis without renal involvement: Secondary | ICD-10-CM | POA: Diagnosis not present

## 2023-01-22 DIAGNOSIS — E042 Nontoxic multinodular goiter: Secondary | ICD-10-CM | POA: Diagnosis not present

## 2023-01-22 DIAGNOSIS — H3530 Unspecified macular degeneration: Secondary | ICD-10-CM | POA: Diagnosis not present

## 2023-01-22 DIAGNOSIS — M810 Age-related osteoporosis without current pathological fracture: Secondary | ICD-10-CM | POA: Diagnosis not present

## 2023-01-23 DIAGNOSIS — Z4789 Encounter for other orthopedic aftercare: Secondary | ICD-10-CM | POA: Diagnosis not present

## 2023-01-26 DIAGNOSIS — Z79899 Other long term (current) drug therapy: Secondary | ICD-10-CM | POA: Diagnosis not present

## 2023-01-26 DIAGNOSIS — K219 Gastro-esophageal reflux disease without esophagitis: Secondary | ICD-10-CM | POA: Diagnosis not present

## 2023-01-26 DIAGNOSIS — Z1211 Encounter for screening for malignant neoplasm of colon: Secondary | ICD-10-CM | POA: Diagnosis not present

## 2023-01-26 DIAGNOSIS — K582 Mixed irritable bowel syndrome: Secondary | ICD-10-CM | POA: Diagnosis not present

## 2023-02-06 DIAGNOSIS — G5622 Lesion of ulnar nerve, left upper limb: Secondary | ICD-10-CM | POA: Diagnosis not present

## 2023-02-06 DIAGNOSIS — M1812 Unilateral primary osteoarthritis of first carpometacarpal joint, left hand: Secondary | ICD-10-CM | POA: Diagnosis not present

## 2023-02-06 DIAGNOSIS — S52572D Other intraarticular fracture of lower end of left radius, subsequent encounter for closed fracture with routine healing: Secondary | ICD-10-CM | POA: Diagnosis not present

## 2023-02-06 DIAGNOSIS — M545 Low back pain, unspecified: Secondary | ICD-10-CM | POA: Diagnosis not present

## 2023-02-06 DIAGNOSIS — M25332 Other instability, left wrist: Secondary | ICD-10-CM | POA: Diagnosis not present

## 2023-02-06 DIAGNOSIS — W1839XD Other fall on same level, subsequent encounter: Secondary | ICD-10-CM | POA: Diagnosis not present

## 2023-02-06 DIAGNOSIS — M810 Age-related osteoporosis without current pathological fracture: Secondary | ICD-10-CM | POA: Diagnosis not present

## 2023-02-06 NOTE — Telephone Encounter (Signed)
Spoke with patient and she states she is doing better at the moment and will not schedule at this time.

## 2023-02-13 DIAGNOSIS — Z4789 Encounter for other orthopedic aftercare: Secondary | ICD-10-CM | POA: Diagnosis not present

## 2023-02-13 DIAGNOSIS — M25642 Stiffness of left hand, not elsewhere classified: Secondary | ICD-10-CM | POA: Diagnosis not present

## 2023-02-13 DIAGNOSIS — M25532 Pain in left wrist: Secondary | ICD-10-CM | POA: Diagnosis not present

## 2023-02-13 DIAGNOSIS — M25632 Stiffness of left wrist, not elsewhere classified: Secondary | ICD-10-CM | POA: Diagnosis not present

## 2023-02-13 DIAGNOSIS — R200 Anesthesia of skin: Secondary | ICD-10-CM | POA: Diagnosis not present

## 2023-02-26 DIAGNOSIS — R490 Dysphonia: Secondary | ICD-10-CM | POA: Diagnosis not present

## 2023-02-26 DIAGNOSIS — M3130 Wegener's granulomatosis without renal involvement: Secondary | ICD-10-CM | POA: Diagnosis not present

## 2023-02-26 DIAGNOSIS — J386 Stenosis of larynx: Secondary | ICD-10-CM | POA: Diagnosis not present

## 2023-02-28 DIAGNOSIS — M810 Age-related osteoporosis without current pathological fracture: Secondary | ICD-10-CM | POA: Diagnosis not present

## 2023-03-20 DIAGNOSIS — M3130 Wegener's granulomatosis without renal involvement: Secondary | ICD-10-CM | POA: Diagnosis not present

## 2023-03-20 DIAGNOSIS — H353131 Nonexudative age-related macular degeneration, bilateral, early dry stage: Secondary | ICD-10-CM | POA: Diagnosis not present

## 2023-03-22 DIAGNOSIS — K219 Gastro-esophageal reflux disease without esophagitis: Secondary | ICD-10-CM | POA: Diagnosis not present

## 2023-03-22 DIAGNOSIS — R140 Abdominal distension (gaseous): Secondary | ICD-10-CM | POA: Diagnosis not present

## 2023-03-22 DIAGNOSIS — Z79899 Other long term (current) drug therapy: Secondary | ICD-10-CM | POA: Diagnosis not present

## 2023-04-14 DIAGNOSIS — M25632 Stiffness of left wrist, not elsewhere classified: Secondary | ICD-10-CM | POA: Diagnosis not present

## 2023-04-14 DIAGNOSIS — Z4789 Encounter for other orthopedic aftercare: Secondary | ICD-10-CM | POA: Diagnosis not present

## 2023-04-14 DIAGNOSIS — M25642 Stiffness of left hand, not elsewhere classified: Secondary | ICD-10-CM | POA: Diagnosis not present

## 2023-04-14 DIAGNOSIS — R200 Anesthesia of skin: Secondary | ICD-10-CM | POA: Diagnosis not present

## 2023-04-18 DIAGNOSIS — M3130 Wegener's granulomatosis without renal involvement: Secondary | ICD-10-CM | POA: Diagnosis not present

## 2023-04-24 DIAGNOSIS — D235 Other benign neoplasm of skin of trunk: Secondary | ICD-10-CM | POA: Diagnosis not present

## 2023-04-24 DIAGNOSIS — D1801 Hemangioma of skin and subcutaneous tissue: Secondary | ICD-10-CM | POA: Diagnosis not present

## 2023-04-24 DIAGNOSIS — L821 Other seborrheic keratosis: Secondary | ICD-10-CM | POA: Diagnosis not present

## 2023-04-24 DIAGNOSIS — L820 Inflamed seborrheic keratosis: Secondary | ICD-10-CM | POA: Diagnosis not present

## 2023-04-24 DIAGNOSIS — L739 Follicular disorder, unspecified: Secondary | ICD-10-CM | POA: Diagnosis not present

## 2023-04-24 DIAGNOSIS — D225 Melanocytic nevi of trunk: Secondary | ICD-10-CM | POA: Diagnosis not present

## 2023-04-24 DIAGNOSIS — D485 Neoplasm of uncertain behavior of skin: Secondary | ICD-10-CM | POA: Diagnosis not present

## 2023-04-24 DIAGNOSIS — L579 Skin changes due to chronic exposure to nonionizing radiation, unspecified: Secondary | ICD-10-CM | POA: Diagnosis not present

## 2023-04-24 DIAGNOSIS — L814 Other melanin hyperpigmentation: Secondary | ICD-10-CM | POA: Diagnosis not present

## 2023-04-24 DIAGNOSIS — L299 Pruritus, unspecified: Secondary | ICD-10-CM | POA: Diagnosis not present

## 2023-05-03 DIAGNOSIS — Z1212 Encounter for screening for malignant neoplasm of rectum: Secondary | ICD-10-CM | POA: Diagnosis not present

## 2023-05-03 DIAGNOSIS — Z1211 Encounter for screening for malignant neoplasm of colon: Secondary | ICD-10-CM | POA: Diagnosis not present

## 2023-05-09 LAB — COLOGUARD: COLOGUARD: NEGATIVE

## 2023-05-12 DIAGNOSIS — R200 Anesthesia of skin: Secondary | ICD-10-CM | POA: Diagnosis not present

## 2023-05-12 DIAGNOSIS — M25632 Stiffness of left wrist, not elsewhere classified: Secondary | ICD-10-CM | POA: Diagnosis not present

## 2023-05-12 DIAGNOSIS — M25642 Stiffness of left hand, not elsewhere classified: Secondary | ICD-10-CM | POA: Diagnosis not present

## 2023-05-12 DIAGNOSIS — Z4789 Encounter for other orthopedic aftercare: Secondary | ICD-10-CM | POA: Diagnosis not present

## 2023-05-12 DIAGNOSIS — M25532 Pain in left wrist: Secondary | ICD-10-CM | POA: Diagnosis not present

## 2023-05-20 DIAGNOSIS — R350 Frequency of micturition: Secondary | ICD-10-CM | POA: Diagnosis not present

## 2023-05-20 DIAGNOSIS — M545 Low back pain, unspecified: Secondary | ICD-10-CM | POA: Diagnosis not present

## 2023-06-09 DIAGNOSIS — Z4789 Encounter for other orthopedic aftercare: Secondary | ICD-10-CM | POA: Diagnosis not present

## 2023-06-09 DIAGNOSIS — R200 Anesthesia of skin: Secondary | ICD-10-CM | POA: Diagnosis not present

## 2023-06-09 DIAGNOSIS — M25632 Stiffness of left wrist, not elsewhere classified: Secondary | ICD-10-CM | POA: Diagnosis not present

## 2023-06-09 DIAGNOSIS — M25532 Pain in left wrist: Secondary | ICD-10-CM | POA: Diagnosis not present

## 2023-06-09 DIAGNOSIS — M25642 Stiffness of left hand, not elsewhere classified: Secondary | ICD-10-CM | POA: Diagnosis not present

## 2023-06-27 DIAGNOSIS — J329 Chronic sinusitis, unspecified: Secondary | ICD-10-CM | POA: Diagnosis not present

## 2023-07-05 DIAGNOSIS — R490 Dysphonia: Secondary | ICD-10-CM | POA: Diagnosis not present

## 2023-07-05 DIAGNOSIS — M3130 Wegener's granulomatosis without renal involvement: Secondary | ICD-10-CM | POA: Diagnosis not present

## 2023-07-05 DIAGNOSIS — J322 Chronic ethmoidal sinusitis: Secondary | ICD-10-CM | POA: Diagnosis not present

## 2023-07-05 DIAGNOSIS — J386 Stenosis of larynx: Secondary | ICD-10-CM | POA: Diagnosis not present

## 2023-07-05 DIAGNOSIS — H04221 Epiphora due to insufficient drainage, right lacrimal gland: Secondary | ICD-10-CM | POA: Diagnosis not present

## 2023-07-05 DIAGNOSIS — H04551 Acquired stenosis of right nasolacrimal duct: Secondary | ICD-10-CM | POA: Diagnosis not present

## 2023-07-11 DIAGNOSIS — M3130 Wegener's granulomatosis without renal involvement: Secondary | ICD-10-CM | POA: Diagnosis not present

## 2023-07-23 DIAGNOSIS — K219 Gastro-esophageal reflux disease without esophagitis: Secondary | ICD-10-CM | POA: Diagnosis not present

## 2023-07-25 DIAGNOSIS — M3130 Wegener's granulomatosis without renal involvement: Secondary | ICD-10-CM | POA: Diagnosis not present

## 2023-07-25 DIAGNOSIS — J329 Chronic sinusitis, unspecified: Secondary | ICD-10-CM | POA: Diagnosis not present

## 2023-08-21 DIAGNOSIS — M3130 Wegener's granulomatosis without renal involvement: Secondary | ICD-10-CM | POA: Diagnosis not present

## 2023-08-21 DIAGNOSIS — E7800 Pure hypercholesterolemia, unspecified: Secondary | ICD-10-CM | POA: Diagnosis not present

## 2023-08-21 DIAGNOSIS — K219 Gastro-esophageal reflux disease without esophagitis: Secondary | ICD-10-CM | POA: Diagnosis not present

## 2023-08-21 DIAGNOSIS — M810 Age-related osteoporosis without current pathological fracture: Secondary | ICD-10-CM | POA: Diagnosis not present

## 2023-08-21 DIAGNOSIS — E042 Nontoxic multinodular goiter: Secondary | ICD-10-CM | POA: Diagnosis not present

## 2023-08-21 DIAGNOSIS — G4700 Insomnia, unspecified: Secondary | ICD-10-CM | POA: Diagnosis not present

## 2023-08-23 DIAGNOSIS — H04551 Acquired stenosis of right nasolacrimal duct: Secondary | ICD-10-CM | POA: Diagnosis not present

## 2023-08-23 DIAGNOSIS — M3130 Wegener's granulomatosis without renal involvement: Secondary | ICD-10-CM | POA: Diagnosis not present

## 2023-09-07 DIAGNOSIS — M7918 Myalgia, other site: Secondary | ICD-10-CM | POA: Diagnosis not present

## 2023-09-07 DIAGNOSIS — M5134 Other intervertebral disc degeneration, thoracic region: Secondary | ICD-10-CM | POA: Diagnosis not present

## 2023-09-07 DIAGNOSIS — M47814 Spondylosis without myelopathy or radiculopathy, thoracic region: Secondary | ICD-10-CM | POA: Diagnosis not present

## 2023-09-11 DIAGNOSIS — Z1231 Encounter for screening mammogram for malignant neoplasm of breast: Secondary | ICD-10-CM | POA: Diagnosis not present

## 2023-09-27 DIAGNOSIS — R92333 Mammographic heterogeneous density, bilateral breasts: Secondary | ICD-10-CM | POA: Diagnosis not present

## 2023-09-27 DIAGNOSIS — R928 Other abnormal and inconclusive findings on diagnostic imaging of breast: Secondary | ICD-10-CM | POA: Diagnosis not present

## 2023-09-27 DIAGNOSIS — N6002 Solitary cyst of left breast: Secondary | ICD-10-CM | POA: Diagnosis not present

## 2023-09-29 DIAGNOSIS — R200 Anesthesia of skin: Secondary | ICD-10-CM | POA: Diagnosis not present

## 2023-09-29 DIAGNOSIS — M25632 Stiffness of left wrist, not elsewhere classified: Secondary | ICD-10-CM | POA: Diagnosis not present

## 2023-09-29 DIAGNOSIS — Z4789 Encounter for other orthopedic aftercare: Secondary | ICD-10-CM | POA: Diagnosis not present

## 2023-09-29 DIAGNOSIS — M25532 Pain in left wrist: Secondary | ICD-10-CM | POA: Diagnosis not present

## 2023-09-29 DIAGNOSIS — M25642 Stiffness of left hand, not elsewhere classified: Secondary | ICD-10-CM | POA: Diagnosis not present

## 2023-10-13 DIAGNOSIS — G8929 Other chronic pain: Secondary | ICD-10-CM | POA: Diagnosis not present

## 2023-10-13 DIAGNOSIS — R200 Anesthesia of skin: Secondary | ICD-10-CM | POA: Diagnosis not present

## 2023-10-13 DIAGNOSIS — M25632 Stiffness of left wrist, not elsewhere classified: Secondary | ICD-10-CM | POA: Diagnosis not present

## 2023-10-13 DIAGNOSIS — M25512 Pain in left shoulder: Secondary | ICD-10-CM | POA: Diagnosis not present

## 2023-10-13 DIAGNOSIS — M5134 Other intervertebral disc degeneration, thoracic region: Secondary | ICD-10-CM | POA: Diagnosis not present

## 2023-10-13 DIAGNOSIS — M542 Cervicalgia: Secondary | ICD-10-CM | POA: Diagnosis not present

## 2023-10-13 DIAGNOSIS — Z4789 Encounter for other orthopedic aftercare: Secondary | ICD-10-CM | POA: Diagnosis not present

## 2023-10-13 DIAGNOSIS — M7918 Myalgia, other site: Secondary | ICD-10-CM | POA: Diagnosis not present

## 2023-10-13 DIAGNOSIS — M25532 Pain in left wrist: Secondary | ICD-10-CM | POA: Diagnosis not present

## 2023-10-13 DIAGNOSIS — M25642 Stiffness of left hand, not elsewhere classified: Secondary | ICD-10-CM | POA: Diagnosis not present

## 2023-10-17 DIAGNOSIS — E7800 Pure hypercholesterolemia, unspecified: Secondary | ICD-10-CM | POA: Diagnosis not present

## 2023-11-12 DIAGNOSIS — H04221 Epiphora due to insufficient drainage, right lacrimal gland: Secondary | ICD-10-CM | POA: Diagnosis not present

## 2023-11-12 DIAGNOSIS — H04551 Acquired stenosis of right nasolacrimal duct: Secondary | ICD-10-CM | POA: Diagnosis not present

## 2023-11-14 DIAGNOSIS — J386 Stenosis of larynx: Secondary | ICD-10-CM | POA: Diagnosis not present

## 2023-11-14 DIAGNOSIS — I7782 Antineutrophilic cytoplasmic antibody (ANCA) vasculitis: Secondary | ICD-10-CM | POA: Diagnosis not present

## 2023-11-22 DIAGNOSIS — R300 Dysuria: Secondary | ICD-10-CM | POA: Diagnosis not present

## 2023-11-22 DIAGNOSIS — Z118 Encounter for screening for other infectious and parasitic diseases: Secondary | ICD-10-CM | POA: Diagnosis not present

## 2023-12-03 DIAGNOSIS — I7782 Antineutrophilic cytoplasmic antibody (ANCA) vasculitis: Secondary | ICD-10-CM | POA: Diagnosis not present

## 2023-12-03 DIAGNOSIS — J386 Stenosis of larynx: Secondary | ICD-10-CM | POA: Diagnosis not present

## 2023-12-03 DIAGNOSIS — R0602 Shortness of breath: Secondary | ICD-10-CM | POA: Diagnosis not present

## 2023-12-03 DIAGNOSIS — R942 Abnormal results of pulmonary function studies: Secondary | ICD-10-CM | POA: Diagnosis not present

## 2023-12-03 DIAGNOSIS — M3130 Wegener's granulomatosis without renal involvement: Secondary | ICD-10-CM | POA: Diagnosis not present

## 2023-12-03 DIAGNOSIS — Z79899 Other long term (current) drug therapy: Secondary | ICD-10-CM | POA: Diagnosis not present

## 2023-12-19 DIAGNOSIS — M3130 Wegener's granulomatosis without renal involvement: Secondary | ICD-10-CM | POA: Diagnosis not present

## 2024-01-05 DIAGNOSIS — M25642 Stiffness of left hand, not elsewhere classified: Secondary | ICD-10-CM | POA: Diagnosis not present

## 2024-01-05 DIAGNOSIS — M25532 Pain in left wrist: Secondary | ICD-10-CM | POA: Diagnosis not present

## 2024-01-05 DIAGNOSIS — M25632 Stiffness of left wrist, not elsewhere classified: Secondary | ICD-10-CM | POA: Diagnosis not present

## 2024-01-05 DIAGNOSIS — R200 Anesthesia of skin: Secondary | ICD-10-CM | POA: Diagnosis not present

## 2024-01-05 DIAGNOSIS — Z4789 Encounter for other orthopedic aftercare: Secondary | ICD-10-CM | POA: Diagnosis not present

## 2024-02-06 DIAGNOSIS — M3130 Wegener's granulomatosis without renal involvement: Secondary | ICD-10-CM | POA: Diagnosis not present

## 2024-02-15 DIAGNOSIS — R9389 Abnormal findings on diagnostic imaging of other specified body structures: Secondary | ICD-10-CM | POA: Diagnosis not present

## 2024-03-04 DIAGNOSIS — M3130 Wegener's granulomatosis without renal involvement: Secondary | ICD-10-CM | POA: Diagnosis not present

## 2024-03-04 DIAGNOSIS — I7782 Antineutrophilic cytoplasmic antibody (ANCA) vasculitis: Secondary | ICD-10-CM | POA: Diagnosis not present

## 2024-03-17 DIAGNOSIS — M810 Age-related osteoporosis without current pathological fracture: Secondary | ICD-10-CM | POA: Diagnosis not present

## 2024-03-27 DIAGNOSIS — M810 Age-related osteoporosis without current pathological fracture: Secondary | ICD-10-CM | POA: Diagnosis not present

## 2024-04-03 DIAGNOSIS — M810 Age-related osteoporosis without current pathological fracture: Secondary | ICD-10-CM | POA: Diagnosis not present

## 2024-04-09 DIAGNOSIS — M3130 Wegener's granulomatosis without renal involvement: Secondary | ICD-10-CM | POA: Diagnosis not present

## 2024-04-10 DIAGNOSIS — E7800 Pure hypercholesterolemia, unspecified: Secondary | ICD-10-CM | POA: Diagnosis not present

## 2024-04-10 DIAGNOSIS — E042 Nontoxic multinodular goiter: Secondary | ICD-10-CM | POA: Diagnosis not present

## 2024-04-10 DIAGNOSIS — K219 Gastro-esophageal reflux disease without esophagitis: Secondary | ICD-10-CM | POA: Diagnosis not present

## 2024-04-10 DIAGNOSIS — M810 Age-related osteoporosis without current pathological fracture: Secondary | ICD-10-CM | POA: Diagnosis not present

## 2024-04-10 DIAGNOSIS — M3130 Wegener's granulomatosis without renal involvement: Secondary | ICD-10-CM | POA: Diagnosis not present

## 2024-04-10 DIAGNOSIS — G4700 Insomnia, unspecified: Secondary | ICD-10-CM | POA: Diagnosis not present

## 2024-05-14 DIAGNOSIS — I7782 Antineutrophilic cytoplasmic antibody (ANCA) vasculitis: Secondary | ICD-10-CM | POA: Diagnosis not present

## 2024-05-20 DIAGNOSIS — B079 Viral wart, unspecified: Secondary | ICD-10-CM | POA: Diagnosis not present

## 2024-05-20 DIAGNOSIS — L821 Other seborrheic keratosis: Secondary | ICD-10-CM | POA: Diagnosis not present

## 2024-05-20 DIAGNOSIS — D225 Melanocytic nevi of trunk: Secondary | ICD-10-CM | POA: Diagnosis not present

## 2024-05-20 DIAGNOSIS — L820 Inflamed seborrheic keratosis: Secondary | ICD-10-CM | POA: Diagnosis not present

## 2024-05-20 DIAGNOSIS — D235 Other benign neoplasm of skin of trunk: Secondary | ICD-10-CM | POA: Diagnosis not present

## 2024-05-20 DIAGNOSIS — L579 Skin changes due to chronic exposure to nonionizing radiation, unspecified: Secondary | ICD-10-CM | POA: Diagnosis not present

## 2024-05-20 DIAGNOSIS — L814 Other melanin hyperpigmentation: Secondary | ICD-10-CM | POA: Diagnosis not present

## 2024-06-03 DIAGNOSIS — M810 Age-related osteoporosis without current pathological fracture: Secondary | ICD-10-CM | POA: Diagnosis not present

## 2024-06-03 DIAGNOSIS — K219 Gastro-esophageal reflux disease without esophagitis: Secondary | ICD-10-CM | POA: Diagnosis not present

## 2024-06-03 DIAGNOSIS — Z87898 Personal history of other specified conditions: Secondary | ICD-10-CM | POA: Diagnosis not present

## 2024-06-03 DIAGNOSIS — M3130 Wegener's granulomatosis without renal involvement: Secondary | ICD-10-CM | POA: Diagnosis not present

## 2024-06-03 DIAGNOSIS — E042 Nontoxic multinodular goiter: Secondary | ICD-10-CM | POA: Diagnosis not present

## 2024-06-03 DIAGNOSIS — E7800 Pure hypercholesterolemia, unspecified: Secondary | ICD-10-CM | POA: Diagnosis not present

## 2024-06-03 DIAGNOSIS — M545 Low back pain, unspecified: Secondary | ICD-10-CM | POA: Diagnosis not present

## 2024-06-03 DIAGNOSIS — G4700 Insomnia, unspecified: Secondary | ICD-10-CM | POA: Diagnosis not present

## 2024-06-09 DIAGNOSIS — J386 Stenosis of larynx: Secondary | ICD-10-CM | POA: Diagnosis not present

## 2024-06-09 DIAGNOSIS — M3130 Wegener's granulomatosis without renal involvement: Secondary | ICD-10-CM | POA: Diagnosis not present

## 2024-06-09 DIAGNOSIS — R490 Dysphonia: Secondary | ICD-10-CM | POA: Diagnosis not present

## 2024-06-13 DIAGNOSIS — M810 Age-related osteoporosis without current pathological fracture: Secondary | ICD-10-CM | POA: Diagnosis not present

## 2024-06-14 DIAGNOSIS — H2513 Age-related nuclear cataract, bilateral: Secondary | ICD-10-CM | POA: Diagnosis not present

## 2024-06-14 DIAGNOSIS — H353132 Nonexudative age-related macular degeneration, bilateral, intermediate dry stage: Secondary | ICD-10-CM | POA: Diagnosis not present

## 2024-06-14 DIAGNOSIS — H524 Presbyopia: Secondary | ICD-10-CM | POA: Diagnosis not present

## 2024-06-23 DIAGNOSIS — H04221 Epiphora due to insufficient drainage, right lacrimal gland: Secondary | ICD-10-CM | POA: Diagnosis not present

## 2024-06-24 DIAGNOSIS — M3130 Wegener's granulomatosis without renal involvement: Secondary | ICD-10-CM | POA: Diagnosis not present

## 2024-06-24 DIAGNOSIS — M810 Age-related osteoporosis without current pathological fracture: Secondary | ICD-10-CM | POA: Diagnosis not present

## 2024-06-24 DIAGNOSIS — G4700 Insomnia, unspecified: Secondary | ICD-10-CM | POA: Diagnosis not present

## 2024-06-24 DIAGNOSIS — R5383 Other fatigue: Secondary | ICD-10-CM | POA: Diagnosis not present

## 2024-06-25 DIAGNOSIS — I7782 Antineutrophilic cytoplasmic antibody (ANCA) vasculitis: Secondary | ICD-10-CM | POA: Diagnosis not present
